# Patient Record
Sex: Male | Born: 1950 | Race: White | Hispanic: No | Marital: Married | Smoking: Never smoker
Health system: Southern US, Community
[De-identification: ages and names within clinical notes are randomized; demographics above are authoritative.]

## PROBLEM LIST (undated history)

## (undated) DIAGNOSIS — J310 Chronic rhinitis: Secondary | ICD-10-CM

## (undated) DIAGNOSIS — C61 Malignant neoplasm of prostate: Secondary | ICD-10-CM

## (undated) DIAGNOSIS — E785 Hyperlipidemia, unspecified: Secondary | ICD-10-CM

## (undated) DIAGNOSIS — Z86010 Personal history of colonic polyps: Secondary | ICD-10-CM

## (undated) DIAGNOSIS — N189 Chronic kidney disease, unspecified: Secondary | ICD-10-CM

## (undated) DIAGNOSIS — Z860101 Personal history of adenomatous and serrated colon polyps: Secondary | ICD-10-CM

## (undated) DIAGNOSIS — K5730 Diverticulosis of large intestine without perforation or abscess without bleeding: Secondary | ICD-10-CM

## (undated) DIAGNOSIS — I82409 Acute embolism and thrombosis of unspecified deep veins of unspecified lower extremity: Secondary | ICD-10-CM

## (undated) DIAGNOSIS — K432 Incisional hernia without obstruction or gangrene: Secondary | ICD-10-CM

## (undated) DIAGNOSIS — I10 Essential (primary) hypertension: Secondary | ICD-10-CM

## (undated) DIAGNOSIS — K5790 Diverticulosis of intestine, part unspecified, without perforation or abscess without bleeding: Secondary | ICD-10-CM

## (undated) DIAGNOSIS — E669 Obesity, unspecified: Secondary | ICD-10-CM

## (undated) DIAGNOSIS — T7840XA Allergy, unspecified, initial encounter: Secondary | ICD-10-CM

## (undated) DIAGNOSIS — C159 Malignant neoplasm of esophagus, unspecified: Secondary | ICD-10-CM

## (undated) HISTORY — DX: Malignant neoplasm of prostate: C61

## (undated) HISTORY — DX: Personal history of colonic polyps: Z86.010

## (undated) HISTORY — PX: EYE SURGERY: SHX253

## (undated) HISTORY — DX: Incisional hernia without obstruction or gangrene: K43.2

## (undated) HISTORY — DX: Hyperlipidemia, unspecified: E78.5

## (undated) HISTORY — DX: Acute embolism and thrombosis of unspecified deep veins of unspecified lower extremity: I82.409

## (undated) HISTORY — DX: Diverticulosis of large intestine without perforation or abscess without bleeding: K57.30

## (undated) HISTORY — DX: Allergy, unspecified, initial encounter: T78.40XA

## (undated) HISTORY — DX: Chronic kidney disease, unspecified: N18.9

## (undated) HISTORY — DX: Chronic rhinitis: J31.0

## (undated) HISTORY — DX: Essential (primary) hypertension: I10

## (undated) HISTORY — DX: Obesity, unspecified: E66.9

## (undated) HISTORY — DX: Diverticulosis of intestine, part unspecified, without perforation or abscess without bleeding: K57.90

## (undated) HISTORY — PX: OTHER SURGICAL HISTORY: SHX169

## (undated) HISTORY — DX: Personal history of adenomatous and serrated colon polyps: Z86.0101

---

## 1999-07-22 ENCOUNTER — Encounter (INDEPENDENT_AMBULATORY_CARE_PROVIDER_SITE_OTHER): Payer: Self-pay

## 1999-07-22 ENCOUNTER — Other Ambulatory Visit: Admission: RE | Admit: 1999-07-22 | Discharge: 1999-07-22 | Payer: Self-pay | Admitting: Gastroenterology

## 1999-07-22 ENCOUNTER — Encounter: Payer: Self-pay | Admitting: Gastroenterology

## 2004-05-03 ENCOUNTER — Ambulatory Visit: Payer: Self-pay | Admitting: Internal Medicine

## 2004-05-09 ENCOUNTER — Ambulatory Visit: Payer: Self-pay | Admitting: Internal Medicine

## 2004-06-13 ENCOUNTER — Encounter: Admission: RE | Admit: 2004-06-13 | Discharge: 2004-06-13 | Payer: Self-pay | Admitting: Internal Medicine

## 2004-07-05 ENCOUNTER — Ambulatory Visit: Payer: Self-pay | Admitting: Internal Medicine

## 2004-07-12 ENCOUNTER — Ambulatory Visit: Payer: Self-pay | Admitting: Internal Medicine

## 2004-12-13 ENCOUNTER — Ambulatory Visit: Payer: Self-pay | Admitting: Internal Medicine

## 2005-01-03 ENCOUNTER — Ambulatory Visit: Payer: Self-pay | Admitting: Internal Medicine

## 2005-02-07 ENCOUNTER — Ambulatory Visit: Payer: Self-pay | Admitting: Internal Medicine

## 2005-03-14 ENCOUNTER — Ambulatory Visit: Payer: Self-pay | Admitting: Internal Medicine

## 2005-04-04 ENCOUNTER — Ambulatory Visit: Payer: Self-pay | Admitting: Internal Medicine

## 2005-08-08 ENCOUNTER — Ambulatory Visit: Payer: Self-pay | Admitting: Internal Medicine

## 2005-08-14 ENCOUNTER — Ambulatory Visit: Payer: Self-pay | Admitting: Internal Medicine

## 2005-08-30 ENCOUNTER — Emergency Department (HOSPITAL_COMMUNITY): Admission: EM | Admit: 2005-08-30 | Discharge: 2005-08-30 | Payer: Self-pay | Admitting: Emergency Medicine

## 2005-09-18 ENCOUNTER — Ambulatory Visit: Payer: Self-pay | Admitting: Gastroenterology

## 2005-10-02 ENCOUNTER — Ambulatory Visit: Payer: Self-pay | Admitting: Gastroenterology

## 2005-10-02 ENCOUNTER — Encounter (INDEPENDENT_AMBULATORY_CARE_PROVIDER_SITE_OTHER): Payer: Self-pay | Admitting: *Deleted

## 2005-10-02 ENCOUNTER — Encounter (INDEPENDENT_AMBULATORY_CARE_PROVIDER_SITE_OTHER): Payer: Self-pay | Admitting: Specialist

## 2006-01-19 ENCOUNTER — Ambulatory Visit: Payer: Self-pay | Admitting: Internal Medicine

## 2006-03-02 ENCOUNTER — Ambulatory Visit: Payer: Self-pay | Admitting: Internal Medicine

## 2006-03-02 LAB — CONVERTED CEMR LAB
ALT: 23 units/L (ref 0–40)
Albumin: 3.6 g/dL (ref 3.5–5.2)
Alkaline Phosphatase: 40 units/L (ref 39–117)
Cholesterol: 175 mg/dL (ref 0–200)
LDL Cholesterol: 120 mg/dL — ABNORMAL HIGH (ref 0–99)
Total Bilirubin: 0.7 mg/dL (ref 0.3–1.2)
Total Protein: 6.3 g/dL (ref 6.0–8.3)

## 2006-03-09 ENCOUNTER — Ambulatory Visit: Payer: Self-pay | Admitting: Internal Medicine

## 2006-06-08 ENCOUNTER — Ambulatory Visit: Payer: Self-pay | Admitting: Internal Medicine

## 2006-06-08 LAB — CONVERTED CEMR LAB
Albumin: 3.6 g/dL (ref 3.5–5.2)
Alkaline Phosphatase: 40 units/L (ref 39–117)
Total Bilirubin: 1.1 mg/dL (ref 0.3–1.2)
Triglycerides: 206 mg/dL (ref 0–149)
VLDL: 41 mg/dL — ABNORMAL HIGH (ref 0–40)

## 2006-06-15 ENCOUNTER — Ambulatory Visit: Payer: Self-pay | Admitting: Internal Medicine

## 2006-08-19 ENCOUNTER — Ambulatory Visit: Payer: Self-pay | Admitting: Internal Medicine

## 2006-08-19 LAB — CONVERTED CEMR LAB
AST: 25 units/L (ref 0–37)
Albumin: 3.7 g/dL (ref 3.5–5.2)
Alkaline Phosphatase: 36 units/L — ABNORMAL LOW (ref 39–117)
BUN: 12 mg/dL (ref 6–23)
Basophils Absolute: 0 10*3/uL (ref 0.0–0.1)
Calcium: 9.1 mg/dL (ref 8.4–10.5)
Chloride: 109 meq/L (ref 96–112)
Cholesterol: 183 mg/dL (ref 0–200)
Creatinine, Ser: 0.9 mg/dL (ref 0.4–1.5)
Eosinophils Absolute: 0.2 10*3/uL (ref 0.0–0.6)
GFR calc non Af Amer: 92 mL/min
HCT: 45.1 % (ref 39.0–52.0)
LDL Cholesterol: 108 mg/dL — ABNORMAL HIGH (ref 0–99)
MCHC: 34.8 g/dL (ref 30.0–36.0)
MCV: 85.9 fL (ref 78.0–100.0)
Monocytes Relative: 9.4 % (ref 3.0–11.0)
PSA: 2.2 ng/mL (ref 0.10–4.00)
Platelets: 237 10*3/uL (ref 150–400)
RBC: 5.26 M/uL (ref 4.22–5.81)
RDW: 13 % (ref 11.5–14.6)
Total Bilirubin: 1 mg/dL (ref 0.3–1.2)
Total CHOL/HDL Ratio: 5.1
Triglycerides: 195 mg/dL — ABNORMAL HIGH (ref 0–149)

## 2006-08-24 ENCOUNTER — Ambulatory Visit: Payer: Self-pay | Admitting: Internal Medicine

## 2006-12-30 DIAGNOSIS — K5730 Diverticulosis of large intestine without perforation or abscess without bleeding: Secondary | ICD-10-CM

## 2006-12-30 DIAGNOSIS — Z860100 Personal history of colon polyps, unspecified: Secondary | ICD-10-CM | POA: Insufficient documentation

## 2006-12-30 DIAGNOSIS — E785 Hyperlipidemia, unspecified: Secondary | ICD-10-CM | POA: Insufficient documentation

## 2006-12-30 HISTORY — DX: Diverticulosis of large intestine without perforation or abscess without bleeding: K57.30

## 2007-04-29 HISTORY — PX: FRACTURE SURGERY: SHX138

## 2007-08-02 ENCOUNTER — Ambulatory Visit: Payer: Self-pay | Admitting: Internal Medicine

## 2007-08-02 LAB — CONVERTED CEMR LAB
HDL goal, serum: 40 mg/dL
LDL Goal: 130 mg/dL

## 2007-10-21 ENCOUNTER — Ambulatory Visit: Payer: Self-pay | Admitting: Internal Medicine

## 2007-10-21 LAB — CONVERTED CEMR LAB
ALT: 29 units/L (ref 0–53)
AST: 25 units/L (ref 0–37)
Bilirubin, Direct: 0.2 mg/dL (ref 0.0–0.3)
Cholesterol: 170 mg/dL (ref 0–200)
HDL: 30.5 mg/dL — ABNORMAL LOW (ref >39.0)
Total Bilirubin: 1 mg/dL (ref 0.3–1.2)
Total Protein: 6.5 g/dL (ref 6.0–8.3)
VLDL: 27 mg/dL (ref 0–40)

## 2007-11-01 ENCOUNTER — Ambulatory Visit: Payer: Self-pay | Admitting: Internal Medicine

## 2007-11-01 DIAGNOSIS — I8310 Varicose veins of unspecified lower extremity with inflammation: Secondary | ICD-10-CM | POA: Insufficient documentation

## 2008-01-21 ENCOUNTER — Telehealth: Payer: Self-pay | Admitting: Internal Medicine

## 2008-01-24 ENCOUNTER — Ambulatory Visit: Payer: Self-pay | Admitting: Internal Medicine

## 2008-02-18 ENCOUNTER — Ambulatory Visit: Payer: Self-pay | Admitting: Internal Medicine

## 2008-02-18 LAB — CONVERTED CEMR LAB
ALT: 24 units/L (ref 0–53)
AST: 27 units/L (ref 0–37)
Albumin: 3.7 g/dL (ref 3.5–5.2)
Alkaline Phosphatase: 44 units/L (ref 39–117)
Bilirubin, Direct: 0.2 mg/dL (ref 0.0–0.3)
CRP, High Sensitivity: 1 (ref 0.00–5.00)
Cholesterol: 184 mg/dL (ref 0–200)
HDL: 29.9 mg/dL — ABNORMAL LOW (ref >39.0)
LDL Cholesterol: 127 mg/dL — ABNORMAL HIGH (ref 0–99)
Total Bilirubin: 0.9 mg/dL (ref 0.3–1.2)
Total CHOL/HDL Ratio: 6.2
Total Protein: 6.5 g/dL (ref 6.0–8.3)
Triglycerides: 137 mg/dL (ref 0–149)
VLDL: 27 mg/dL (ref 0–40)

## 2008-02-29 ENCOUNTER — Ambulatory Visit: Payer: Self-pay | Admitting: Internal Medicine

## 2008-04-18 ENCOUNTER — Inpatient Hospital Stay (HOSPITAL_COMMUNITY): Admission: EM | Admit: 2008-04-18 | Discharge: 2008-04-29 | Payer: Self-pay | Admitting: Emergency Medicine

## 2008-04-19 ENCOUNTER — Ambulatory Visit: Payer: Self-pay | Admitting: Gastroenterology

## 2008-04-24 ENCOUNTER — Encounter: Payer: Self-pay | Admitting: Orthopedic Surgery

## 2008-04-24 HISTORY — PX: OTHER SURGICAL HISTORY: SHX169

## 2008-07-04 ENCOUNTER — Encounter: Admission: RE | Admit: 2008-07-04 | Discharge: 2008-07-04 | Payer: Self-pay | Admitting: Orthopedic Surgery

## 2008-07-04 ENCOUNTER — Encounter: Payer: Self-pay | Admitting: Internal Medicine

## 2008-08-08 ENCOUNTER — Ambulatory Visit: Payer: Self-pay | Admitting: Internal Medicine

## 2008-08-08 LAB — CONVERTED CEMR LAB
Albumin: 4 g/dL (ref 3.5–5.2)
CRP, High Sensitivity: 4 (ref 0.00–5.00)
Cholesterol: 188 mg/dL (ref 0–200)
LDL Cholesterol: 129 mg/dL — ABNORMAL HIGH (ref 0–99)
Total CHOL/HDL Ratio: 5
Total Protein: 7.4 g/dL (ref 6.0–8.3)
Triglycerides: 126 mg/dL (ref 0.0–149.0)
VLDL: 25.2 mg/dL (ref 0.0–40.0)

## 2008-08-09 ENCOUNTER — Encounter: Payer: Self-pay | Admitting: Internal Medicine

## 2008-08-11 ENCOUNTER — Encounter (INDEPENDENT_AMBULATORY_CARE_PROVIDER_SITE_OTHER): Payer: Self-pay | Admitting: *Deleted

## 2008-08-15 ENCOUNTER — Ambulatory Visit: Payer: Self-pay | Admitting: Internal Medicine

## 2008-08-15 DIAGNOSIS — M85851 Other specified disorders of bone density and structure, right thigh: Secondary | ICD-10-CM

## 2008-09-15 ENCOUNTER — Telehealth: Payer: Self-pay | Admitting: Gastroenterology

## 2008-09-26 HISTORY — PX: POLYPECTOMY: SHX149

## 2008-09-26 HISTORY — PX: COLONOSCOPY: SHX174

## 2008-10-03 ENCOUNTER — Ambulatory Visit: Payer: Self-pay | Admitting: Gastroenterology

## 2008-10-03 ENCOUNTER — Telehealth: Payer: Self-pay | Admitting: Internal Medicine

## 2008-10-03 ENCOUNTER — Telehealth (INDEPENDENT_AMBULATORY_CARE_PROVIDER_SITE_OTHER): Payer: Self-pay | Admitting: *Deleted

## 2008-10-04 ENCOUNTER — Telehealth: Payer: Self-pay | Admitting: Gastroenterology

## 2008-10-11 ENCOUNTER — Ambulatory Visit: Payer: Self-pay | Admitting: Gastroenterology

## 2008-10-11 DIAGNOSIS — K921 Melena: Secondary | ICD-10-CM

## 2008-10-13 ENCOUNTER — Ambulatory Visit: Payer: Self-pay | Admitting: Gastroenterology

## 2008-10-13 ENCOUNTER — Encounter: Payer: Self-pay | Admitting: Gastroenterology

## 2008-10-21 ENCOUNTER — Encounter: Payer: Self-pay | Admitting: Gastroenterology

## 2008-11-13 ENCOUNTER — Ambulatory Visit: Payer: Self-pay | Admitting: Internal Medicine

## 2009-02-12 ENCOUNTER — Ambulatory Visit: Payer: Self-pay | Admitting: Internal Medicine

## 2009-02-12 LAB — CONVERTED CEMR LAB
ALT: 18 units/L (ref 0–53)
BUN: 21 mg/dL (ref 6–23)
Basophils Relative: 1 % (ref 0.0–3.0)
Bilirubin Urine: NEGATIVE
CO2: 31 meq/L (ref 19–32)
Chloride: 104 meq/L (ref 96–112)
Cholesterol: 198 mg/dL (ref 0–200)
Eosinophils Relative: 6.8 % — ABNORMAL HIGH (ref 0.0–5.0)
Glucose, Urine, Semiquant: NEGATIVE
HCT: 43.4 % (ref 39.0–52.0)
Ketones, urine, test strip: NEGATIVE
LDL Cholesterol: 139 mg/dL — ABNORMAL HIGH (ref 0–99)
Lymphs Abs: 1.4 10*3/uL (ref 0.7–4.0)
MCHC: 33.6 g/dL (ref 30.0–36.0)
MCV: 88.9 fL (ref 78.0–100.0)
Monocytes Absolute: 0.5 10*3/uL (ref 0.1–1.0)
PSA: 3.03 ng/mL (ref 0.10–4.00)
Platelets: 190 10*3/uL (ref 150.0–400.0)
Potassium: 3.8 meq/L (ref 3.5–5.1)
Specific Gravity, Urine: 1.02
TSH: 1.96 microintl units/mL (ref 0.35–5.50)
Total Bilirubin: 1.1 mg/dL (ref 0.3–1.2)
Total Protein: 7.1 g/dL (ref 6.0–8.3)
WBC: 5.3 10*3/uL (ref 4.5–10.5)
pH: 5

## 2009-02-19 ENCOUNTER — Ambulatory Visit: Payer: Self-pay | Admitting: Internal Medicine

## 2009-04-24 ENCOUNTER — Ambulatory Visit: Payer: Self-pay | Admitting: Internal Medicine

## 2009-04-24 LAB — CONVERTED CEMR LAB
AST: 21 units/L (ref 0–37)
Alkaline Phosphatase: 37 units/L — ABNORMAL LOW (ref 39–117)
Total Bilirubin: 0.8 mg/dL (ref 0.3–1.2)
Total CHOL/HDL Ratio: 5

## 2009-05-01 ENCOUNTER — Ambulatory Visit: Payer: Self-pay | Admitting: Internal Medicine

## 2009-05-01 DIAGNOSIS — R030 Elevated blood-pressure reading, without diagnosis of hypertension: Secondary | ICD-10-CM

## 2009-05-01 DIAGNOSIS — M25579 Pain in unspecified ankle and joints of unspecified foot: Secondary | ICD-10-CM | POA: Insufficient documentation

## 2009-08-16 ENCOUNTER — Ambulatory Visit: Payer: Self-pay | Admitting: Internal Medicine

## 2009-08-16 LAB — CONVERTED CEMR LAB
HDL: 41.9 mg/dL (ref >39.00)
LDL Cholesterol: 127 mg/dL — ABNORMAL HIGH (ref 0–99)
Total Bilirubin: 1.1 mg/dL (ref 0.3–1.2)
Total CHOL/HDL Ratio: 5
VLDL: 24.4 mg/dL (ref 0.0–40.0)

## 2009-08-23 ENCOUNTER — Ambulatory Visit: Payer: Self-pay | Admitting: Internal Medicine

## 2009-08-23 LAB — CONVERTED CEMR LAB
Cholesterol, target level: 200 mg/dL
LDL Goal: 160 mg/dL

## 2009-10-25 ENCOUNTER — Ambulatory Visit: Payer: Self-pay | Admitting: Internal Medicine

## 2010-04-26 ENCOUNTER — Ambulatory Visit: Payer: Self-pay | Admitting: Internal Medicine

## 2010-05-28 NOTE — Assessment & Plan Note (Signed)
Summary: 2 MONTH ROV/NJR   Vital Signs:  Patient profile:   60 year old male Height:      74 inches Weight:      244 pounds BMI:     31.44 Temp:     98.2 degrees F oral Pulse rate:   76 / minute Resp:     14 per minute BP sitting:   138 / 80  (left arm)  Vitals Entered By: Willy Eddy, LPN (October 25, 2009 9:53 AM)  Nutrition Counseling: Patient's BMI is greater than 25 and therefore counseled on weight management options. CC: roa, Lipid Management   Primary Care Provider:  Darryll Capers, MD  CC:  roa and Lipid Management.  History of Present Illness: follow up of lipids  Follow-Up Visit      This is a 60 year old man who presents for Follow-up visit.  The patient denies chest pain, palpitations, dizziness, syncope, low blood sugar symptoms, high blood sugar symptoms, edema, SOB, DOE, PND, and orthopnea.    Lipid Management History:      Positive NCEP/ATP III risk factors include male age 61 years old or older. or older.  Negative NCEP/ATP III risk factors include non-tobacco-user status, non-hypertensive, no ASHD (atherosclerotic heart disease), no prior stroke/TIA, no peripheral vascular disease, and no history of aortic aneurysm.     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) 7)  Family History Diabetes 1st Degree Relative  (ICD-V18.0) 8)  Hyperlipidemia  (ICD-272.4) 9)  Diverticulosis, Colon  (ICD-562.10) 10)  Colonic Polyps, Hx of  (ICD-V12.72)  Current Problems (verified): 1)  Ankle Pain, Chronic  (ICD-719.47) 2)  Elevated Bp Reading Without Dx Hypertension  (ICD-796.2) 3)  Blood in Stool  (ICD-578.1) 4)  Preventive Health Care   (ICD-V70.0) 5)  Osteopenia  (ICD-733.90) 6)  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 Once Daily 2)  Fish Oil Double Strength 1200 Mg  Caps (Omega-3 Fatty Acids) .... 3 Once Daily 3)  Multivitamins   Caps (Multiple Vitamin) .... Once Daily 4)  Caltrate 600+d 600-400 Mg-Unit Tabs (Calcium Carbonate-Vitamin D) .Marland Kitchen.. 1 Two Times A Day 5)  Niacin Flush Free 500 Mg Caps (Inositol Niacinate) .... 2 Once Daily  Current Medications (verified): 1)  Bl Aspirin 325 Mg  Tabs (Aspirin) .... Once Daily 2)  Fish Oil Double Strength 1200 Mg  Caps (Omega-3 Fatty Acids) .... 3 Once Daily 3)  Multivitamins   Caps (Multiple Vitamin) .... Once Daily 4)  Caltrate 600+d 600-400 Mg-Unit Tabs (Calcium Carbonate-Vitamin D) .Marland Kitchen.. 1 Two Times A Day 5)  Niacin Flush Free 500 Mg Caps (Inositol Niacinate) .... 2 Once Daily  Allergies (verified): 1)  * Fentanyl  Past History:  Family History: Last updated: 10/11/2008 Family History of Arthritis Family History Diabetes 1st degree relative Family History of Stroke M 1st degree relative <50 Family History of Cardiovascular disorder father had cerebral hemorrhage Family History of Colon Polyps: Father  Family History of Colon Cancer:Great Aunt  Social History: Last updated: 10/11/2008 Occupation:Retired  Married Never Smoked Alcohol use-yes: 2 per week  Drug use-no Daily Caffeine Use:1 cup daily  Risk Factors: Smoking Status: never (10/25/2009)  Past medical, surgical, family and social histories (including risk factors) reviewed, and no changes noted (except as noted below).  Past Medical History: Reviewed history from 10/11/2008 and no changes  required. 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 Eye surgery Fx L tibia, fibula with external fixator Removal fixator Varicocele repair  Bilateral vein ligation  Family History: Reviewed history from 10/11/2008 and no changes required. Family History of Arthritis Family History Diabetes 1st degree relative Family History of Stroke M 1st degree relative <50 Family History of Cardiovascular disorder father had cerebral hemorrhage Family History of Colon Polyps: Father Family History of Colon Cancer:Great Aunt  Social History: Reviewed history from 10/11/2008 and no changes required. Occupation:Retired  Married Never Smoked Alcohol use-yes: 2 per week  Drug use-no Daily Caffeine Use:1 cup daily  Review of Systems  The patient denies anorexia, fever, weight loss, weight gain, vision loss, decreased hearing, hoarseness, chest pain, syncope, dyspnea on exertion, peripheral edema, prolonged cough, headaches, hemoptysis, abdominal pain, melena, hematochezia, severe indigestion/heartburn, hematuria, incontinence, genital sores, muscle weakness, suspicious skin lesions, transient blindness, difficulty walking, depression, unusual weight change, abnormal bleeding, enlarged lymph nodes, angioedema, and breast masses.    Physical Exam  General:  Well developed, well nourished, no acute distress. Head:  Normocephalic and atraumatic. Eyes:  PERRLA, no icterus. Nose:  no external deformity and no nasal discharge.   Mouth:  no gingival abnormalities and pharynx pink and moist.   Neck:  supple and full ROM.   Lungs:  normal respiratory effort and no wheezes.     Impression & Recommendations:  Problem # 1:  HYPERLIPIDEMIA (ICD-272.4)  Labs Reviewed: SGOT: 18 (08/16/2009)   SGPT: 20 (08/16/2009)  Lipid Goals: Chol Goal: 200 (08/23/2009)    HDL Goal: 40 (08/23/2009)   LDL Goal: 160 (08/23/2009)   TG Goal: 150 (08/23/2009)  10 Yr Risk Heart Disease: 14 % Prior 10 Yr Risk Heart Disease: 18 % (08/23/2009)   HDL:41.90 (08/16/2009), 38.60 (04/24/2009)  LDL:127 (08/16/2009), 139 (02/12/2009)  Chol:193 (08/16/2009), 211 (04/24/2009)  Trig:122.0 (08/16/2009), 136.0 (04/24/2009)  Problem # 2:  ELEVATED BP READING WITHOUT DX HYPERTENSION (ICD-796.2)  BP today: 138/80 Prior BP: 140/82 (08/23/2009)  10 Yr Risk Heart Disease: 14 % Prior 10 Yr Risk Heart Disease: 18 % (08/23/2009)  Labs Reviewed: Creat: 0.9 (02/12/2009) Chol: 193 (08/16/2009)   HDL: 41.90 (08/16/2009)   LDL: 127 (08/16/2009)   TG: 122.0 (08/16/2009)  Instructed in low sodium diet (DASH Handout) and behavior modification.    Problem # 3:  VARICOSE VEINS LOWER EXTREMITIES W/INFLAMMATION (ICD-454.1)  Complete Medication List: 1)  Bl Aspirin 325 Mg Tabs (Aspirin) .... Once daily 2)  Fish Oil Double Strength 1200 Mg Caps (Omega-3 fatty acids) .... 3 once daily 3)  Multivitamins Caps (Multiple vitamin) .... Once daily 4)  Caltrate 600+d 600-400 Mg-unit Tabs (Calcium carbonate-vitamin d) .Marland Kitchen.. 1 two times a day 5)  Niacin Flush Free 500 Mg Caps (Inositol niacinate) .... 2 once daily  Lipid Assessment/Plan:      Based on NCEP/ATP III, the patient's risk factor category is "0-1 risk factors".  The patient's lipid goals are as follows: Total cholesterol goal is 200; LDL cholesterol goal is 160; HDL  cholesterol goal is 40; Triglyceride goal is 150.  His LDL cholesterol goal has been met.    Patient Instructions: 1)  CPX in 4 months  Appended Document: Orders Update    Clinical Lists Changes  Orders: Added new Service order of Zoster (Shingles) Vaccine Live [phone]) - Signed Added new Service order of Admin 1st Vaccine ([id number]) - Signed Observations: Added new observation of ZOSTAVAX LOT: [id number] (10/25/2009 10:47) Added new observation of ZOSTAVAX EXP: 11/21/2010  (10/25/2009 10:47) Added new observation of ZOSTAVAX BY: Willy Eddy, LPN (14/78/2956 10:47) Added new observation of ZOSTAVAX RTE: McNeal (10/25/2009 10:47) Added new observation of ZOSTAVAXDOSE: 0.5 ml (10/25/2009 10:47) Added new observation of ZOSTAVAX MFR: Merck (10/25/2009 10:47) Added new observation of ZOSTAVAXSITE: left deltoid (10/25/2009 10:47) Added new observation of ZOSTAVAX: Zostavax (10/25/2009 10:47)       Immunizations Administered:  Zostavax # 1:    Vaccine Type: Zostavax    Site: left deltoid    Mfr: Merck    Dose: 0.5 ml    Route: Nuevo    Given by: Willy Eddy, LPN    Exp. Date: 11/21/2010    Lot #: [id number]

## 2010-05-28 NOTE — Miscellaneous (Signed)
Summary: Waiver of Liability for Zostavax  Waiver of Liability for Zostavax   Imported By: Maryln Gottron 10/30/2009 09:35:47  _____________________________________________________________________  External Attachment:    Type:   Image     Comment:   External Document

## 2010-05-28 NOTE — Assessment & Plan Note (Signed)
Summary: 2 month roa//lh   Vital Signs:  Patient profile:   60 year old male Height:      74 inches Weight:      247 pounds BMI:     31.83 Temp:     98.2 degrees F oral Pulse rate:   76 / minute Resp:     14 per minute BP sitting:   150 / 84  (left arm)  Vitals Entered By: Willy Eddy, LPN (May 01, 2009 10:06 AM) CC: roa- labs, Hypertension Management, Headache   Primary Care Provider:  Darryll Capers, MD  CC:  roa- labs, Hypertension Management, and Headache.  History of Present Illness: the pt has a family  hx of cerebral anurysm the pt has hyperlipidemia and HTN as additional risks the lotrimen ultra has helped, the dermatologist rxed ketoconasole and betamethasone which has helped   Hypertension History:      He denies headache, chest pain, palpitations, dyspnea with exertion, orthopnea, PND, peripheral edema, visual symptoms, neurologic problems, syncope, and side effects from treatment.  with the weight issues.        Positive major cardiovascular risk factors include male age 39 years old or older and hyperlipidemia.  Negative major cardiovascular risk factors include no history of hypertension and non-tobacco-user status.        Further assessment for target organ damage reveals no history of ASHD, stroke/TIA, or peripheral vascular disease.    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 Once Daily 2)  Fish Oil Double Strength 1200  Mg  Caps (Omega-3 Fatty Acids) .... 3 Once Daily 3)  Multivitamins   Caps (Multiple Vitamin) .... Once Daily 4)  Caltrate 600+d 600-400 Mg-Unit Tabs (Calcium Carbonate-Vitamin D) .Marland Kitchen.. 1 Two Times A Day 5)  Livalo 2 Mg Tabs (Pitavastatin Calcium) .... One By Mouth Weeklu  Current Medications (verified): 1)  Bl Aspirin 325 Mg  Tabs (Aspirin) .... Once Daily 2)  Fish Oil Double Strength 1200 Mg  Caps (Omega-3 Fatty Acids) .... 3 Once Daily 3)  Multivitamins   Caps (Multiple Vitamin) .... Once Daily 4)  Caltrate 600+d 600-400 Mg-Unit Tabs (Calcium Carbonate-Vitamin D) .Marland Kitchen.. 1 Two Times A Day 5)  Livalo 2 Mg Tabs (Pitavastatin Calcium) .... One By Mouth Weeklu  Allergies (verified): 1)  * Fentanyl  Past History:  Family History: Last updated: 10/11/2008 Family History of Arthritis Family History Diabetes 1st degree relative Family History of Stroke M 1st degree relative <50 Family History of Cardiovascular disorder father had cerebral hemorrhage Family History of Colon Polyps: Father Family History of Colon Cancer:Great Aunt  Social History: Last updated: 10/11/2008 Occupation:Retired  Married Never Smoked Alcohol use-yes: 2 per week  Drug use-no Daily Caffeine Use:1 cup daily  Risk Factors: Smoking Status: never (05/01/2009)  Past medical, surgical, family and social histories (including risk factors) reviewed, and no changes noted (except as noted below).  Past Medical History: Reviewed history from 10/11/2008 and no changes required. 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 Eye surgery Fx L tibia, fibula with external fixator Removal fixator Varicocele repair  Bilateral vein ligation  Family History: Reviewed history from 10/11/2008 and no changes required. Family History of Arthritis Family History Diabetes 1st degree  relative Family History of Stroke M 1st degree relative <50 Family History of Cardiovascular disorder father had cerebral hemorrhage Family History of Colon Polyps: Father Family History of Colon Cancer:Great Aunt  Social History: Reviewed history from 10/11/2008 and no changes required. Occupation:Retired  Married Never Smoked Alcohol use-yes: 2 per week  Drug use-no Daily Caffeine Use:1 cup daily  Review of Systems       The patient complains of weight gain.  The patient denies anorexia, fever, weight loss, vision loss, decreased hearing, hoarseness, chest pain, syncope, dyspnea on exertion, peripheral edema, prolonged cough, headaches, hemoptysis, abdominal pain, melena, hematochezia, severe indigestion/heartburn, hematuria, incontinence, genital sores, muscle weakness, suspicious skin lesions, transient blindness, difficulty walking, depression, unusual weight change, abnormal bleeding, enlarged lymph nodes, angioedema, breast masses, and testicular masses.    Physical Exam  General:  Well developed, well nourished, no acute distress. Head:  Normocephalic and atraumatic. Eyes:  PERRLA, no icterus. Ears:  R ear normal and no external deformities.   Nose:  no external deformity and no nasal discharge.   Mouth:  no gingival abnormalities and pharynx pink and moist.   Neck:  supple and full ROM.   Lungs:  normal respiratory effort and no wheezes.   Heart:  normal rate, regular rhythm, and no rub.   Abdomen:  no masses and no guarding.     Impression & Recommendations:  Problem # 1:  HYPERLIPIDEMIA (ICD-272.4) diffuse rash on a abdominal wall suggestive of a drug rash His updated medication list for this problem includes:    Livalo 2 Mg Tabs (Pitavastatin calcium) ..... One by mouth weeklu  Labs Reviewed: SGOT: 21 (04/24/2009)   SGPT: 22 (04/24/2009)  Lipid Goals: Chol Goal: 200 (08/02/2007)   HDL Goal: 40 (08/02/2007)   LDL Goal: 130 (08/02/2007)   TG Goal: 150  (08/02/2007)  Prior 10 Yr Risk Heart Disease: 18 % (08/15/2008)   HDL:38.60 (04/24/2009), 36.40 (02/12/2009)  LDL:139 (02/12/2009), 129 (08/08/2008)  Chol:211 (04/24/2009), 198 (02/12/2009)  Trig:136.0 (04/24/2009), 113.0 (02/12/2009)  Problem # 2:  VARICOSE VEINS LOWER EXTREMITIES W/INFLAMMATION (ICD-454.1) wearing hose  Problem # 3:  ABDOMINAL PAIN, PERIUMBILIC (ICD-789.05)  stable  Discussed symptom control with the patient.   Problem # 4:  ELEVATED BP READING WITHOUT DX HYPERTENSION (ICD-796.2)  BP today: 150/84 Prior BP: 136/80 (02/19/2009)  10 Yr Risk Heart Disease: 14 % Prior 10 Yr Risk Heart Disease: 18 % (08/15/2008)  Labs Reviewed: Creat: 0.9 (02/12/2009) Chol: 211 (04/24/2009)   HDL: 38.60 (04/24/2009)   LDL: 139 (02/12/2009)   TG: 136.0 (04/24/2009)  Instructed in low sodium diet (DASH Handout) and behavior modification.    Complete Medication List: 1)  Bl Aspirin 325 Mg Tabs (Aspirin) .... Once daily 2)  Fish Oil Double Strength 1200 Mg Caps (Omega-3 fatty acids) .... 3 once daily 3)  Multivitamins Caps (Multiple vitamin) .... Once daily 4)  Caltrate 600+d 600-400 Mg-unit Tabs (Calcium carbonate-vitamin d) .Marland Kitchen.. 1 two times a day 5)  Livalo 2 Mg Tabs (Pitavastatin calcium) .... One by mouth weeklu  Other Orders: Physical Therapy Referral (PT)  Hypertension Assessment/Plan:      The patient's hypertensive risk group is category B:  At least one risk factor (excluding diabetes) with no target organ damage.  His calculated 10 year risk of coronary heart disease is 14 %.  Today's blood pressure is 150/84.  His blood pressure goal is < 140/90.    Patient Instructions: 1)  blood pressure is elevated 150/90 with historical blood pressure n the 130 range so the weight gain plays the biggest role 2)  the lipoids are worse as well and we need to resure diet, exercize and weight loss for the best treatment 3)  resume the niacin 500 mg of no flush two a day 4)  Please  schedule a follow-up appointment in 3 months. 5)  Hepatic Panel prior to visit, ICD-9:995.20 6)  Lipid Panel prior to visit, ICD-9:272.4

## 2010-05-28 NOTE — Assessment & Plan Note (Signed)
Summary: 3 MONTH ROV/NJR/PT RESCD FROM BUMP//CCM/PT RSC/CJR   Vital Signs:  Patient profile:   60 year old male Height:      74 inches Weight:      252 pounds BMI:     32.47 Temp:     98.6 degrees F oral Pulse rate:   76 / minute Resp:     14 per minute BP sitting:   140 / 82  (left arm)  Vitals Entered By: Willy Eddy, LPN (August 23, 2009 10:27 AM) CC: roa labs- back on flush free niacin- livalo caused rash, Lipid Management   Primary Care Provider:  Darryll Capers, MD  CC:  roa labs- back on flush free niacin- livalo caused rash and Lipid Management.  History of Present Illness: WEIGTH IS INCREASED  Hyperlipidemia Follow-Up      This is a 60 year old man who presents for Hyperlipidemia follow-up.  the weight reducton goal were not met due to evening meals and exercize.  The patient denies muscle aches, GI upset, abdominal pain, flushing, itching, constipation, diarrhea, and fatigue.  The patient denies the following symptoms: chest pain/pressure, exercise intolerance, dypsnea, palpitations, syncope, and pedal edema.  Compliance with medications (by patient report) has been intermittent.  Dietary compliance has been poor.  The patient reports exercising occasionally.    Lipid Management History:      Positive NCEP/ATP III risk factors include male age 30 years old or older.  Negative NCEP/ATP III risk factors include non-tobacco-user status, non-hypertensive, no ASHD (atherosclerotic heart disease), no prior stroke/TIA, no peripheral vascular disease, and no history of aortic aneurysm.     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) 7)   Family History Diabetes 1st Degree Relative  (ICD-V18.0) 8)  Hyperlipidemia  (ICD-272.4) 9)  Diverticulosis, Colon  (ICD-562.10) 10)  Colonic Polyps, Hx of  (ICD-V12.72)  Current Problems (verified): 1)  Ankle Pain, Chronic  (ICD-719.47) 2)  Elevated Bp Reading Without Dx Hypertension  (ICD-796.2) 3)  Blood in Stool  (ICD-578.1) 4)  Preventive Health Care  (ICD-V70.0) 5)  Osteopenia  (ICD-733.90) 6)  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 Once Daily 2)  Fish Oil Double Strength 1200 Mg  Caps (Omega-3 Fatty Acids) .... 3 Once Daily 3)  Multivitamins   Caps (Multiple Vitamin) .... Once Daily 4)  Caltrate 600+d 600-400 Mg-Unit Tabs (Calcium Carbonate-Vitamin D) .Marland Kitchen.. 1 Two Times A Day 5)  Livalo 2 Mg Tabs (Pitavastatin Calcium) .... One By Mouth Weeklu  Current Medications (verified): 1)  Bl Aspirin 325 Mg  Tabs (Aspirin) .... Once Daily 2)  Fish Oil Double Strength 1200 Mg  Caps (Omega-3 Fatty Acids) .... 3 Once Daily 3)  Multivitamins   Caps (Multiple Vitamin) .... Once Daily 4)  Caltrate 600+d 600-400 Mg-Unit Tabs (Calcium Carbonate-Vitamin D) .Marland Kitchen.. 1 Two Times A Day 5)  Niacin Flush Free 500 Mg Caps (Inositol Niacinate) .... 2 Once Daily  Allergies (verified): 1)  * Fentanyl  Past History:  Family History: Last updated: 10/11/2008 Family History of Arthritis Family History Diabetes 1st degree relative Family History of Stroke M 1st degree relative <50 Family History of Cardiovascular disorder father had cerebral hemorrhage Family History of Colon Polyps: Father Family History of Colon Cancer:Great Aunt  Social History: Last updated: 10/11/2008 Occupation:Retired  Married Never Smoked Alcohol use-yes: 2  per week  Drug use-no Daily Caffeine Use:1 cup daily  Risk Factors: Smoking Status: never (08/23/2009)  Past medical, surgical, family and social histories (including risk factors) reviewed, and no changes noted (except as noted below).  Past Medical History: Reviewed history from 10/11/2008 and no changes required. 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 Eye surgery Fx L tibia, fibula with external fixator Removal fixator Varicocele repair  Bilateral vein ligation  Family History: Reviewed history from 10/11/2008 and no changes required. Family History of Arthritis Family History Diabetes 1st degree relative Family History of Stroke M 1st degree relative <50 Family History of Cardiovascular disorder father had cerebral hemorrhage Family History of Colon Polyps: Father Family History of Colon Cancer:Great Aunt  Social History: Reviewed history from 10/11/2008 and no changes required. Occupation:Retired  Married Never Smoked Alcohol use-yes: 2 per week  Drug use-no Daily Caffeine Use:1 cup daily  Review of Systems  The patient denies anorexia, fever, weight loss, weight gain, vision loss, decreased hearing, hoarseness, chest pain, syncope, dyspnea on exertion, peripheral edema, prolonged cough, headaches, hemoptysis, abdominal pain, melena, hematochezia, severe indigestion/heartburn, hematuria, incontinence, genital sores, muscle weakness, suspicious skin lesions, transient blindness, difficulty walking, depression, unusual weight change, abnormal bleeding, enlarged lymph nodes, angioedema, and breast masses.    Physical Exam  General:  Well developed, well nourished, no acute distress. Head:  Normocephalic and atraumatic. Eyes:  PERRLA, no icterus. Ears:  R ear normal and no external deformities.   Nose:  no external  deformity and no nasal discharge.   Mouth:  no gingival abnormalities and pharynx pink and moist.   Neck:  supple and full ROM.   Lungs:  normal respiratory effort and no wheezes.   Heart:  normal rate, regular rhythm, and no rub.   Abdomen:  no masses and no guarding.   Msk:  normal ROM, no joint tenderness, and no joint swelling.     Impression & Recommendations:  Problem # 1:  ELEVATED BP READING WITHOUT DX HYPERTENSION (ICD-796.2) Assessment Unchanged  BP today: 140/82 Prior BP: 150/84 (05/01/2009)  10 Yr Risk Heart Disease: 18 % Prior 10 Yr Risk Heart Disease: 14 % (05/01/2009)  Labs Reviewed: Creat: 0.9 (02/12/2009) Chol: 193 (08/16/2009)   HDL: 41.90 (08/16/2009)   LDL: 127 (08/16/2009)   TG: 122.0 (08/16/2009)  Instructed in low sodium diet (DASH Handout) and behavior modification.    Problem # 2:  HYPERLIPIDEMIA (ICD-272.4) Assessment: Improved  Labs Reviewed: SGOT: 18 (08/16/2009)   SGPT: 20 (08/16/2009)  Lipid Goals: Chol Goal: 200 (08/23/2009)   HDL Goal: 40 (08/23/2009)   LDL Goal: 160 (08/23/2009)   TG Goal: 150 (08/23/2009)  10 Yr Risk Heart Disease: 18 % Prior 10 Yr Risk Heart Disease: 14 % (05/01/2009)   HDL:41.90 (08/16/2009), 38.60 (04/24/2009)  LDL:127 (08/16/2009), 139 (02/12/2009)  Chol:193 (08/16/2009), 211 (04/24/2009)  Trig:122.0 (08/16/2009), 136.0 (04/24/2009)  Problem # 3:  DIVERTICULOSIS, COLON (ICD-562.10)  stable with diet  Colonoscopy:  Labs Reviewed: Hgb: 14.6 (02/12/2009)   Hct: 43.4 (  02/12/2009)   WBC: 5.3 (02/12/2009)  Complete Medication List: 1)  Bl Aspirin 325 Mg Tabs (Aspirin) .... Once daily 2)  Fish Oil Double Strength 1200 Mg Caps (Omega-3 fatty acids) .... 3 once daily 3)  Multivitamins Caps (Multiple vitamin) .... Once daily 4)  Caltrate 600+d 600-400 Mg-unit Tabs (Calcium carbonate-vitamin d) .Marland Kitchen.. 1 two times a day 5)  Niacin Flush Free 500 Mg Caps (Inositol niacinate) .... 2 once daily  Lipid Assessment/Plan:       Based on NCEP/ATP III, the patient's risk factor category is "0-1 risk factors".  The patient's lipid goals are as follows: Total cholesterol goal is 200; LDL cholesterol goal is 160; HDL cholesterol goal is 40; Triglyceride goal is 150.  His LDL cholesterol goal has been met.    Patient Instructions: 1)  Please schedule a follow-up appointment in 2 months.

## 2010-07-11 ENCOUNTER — Other Ambulatory Visit: Payer: Self-pay

## 2010-07-22 ENCOUNTER — Encounter: Payer: Self-pay | Admitting: Internal Medicine

## 2010-09-10 NOTE — Consult Note (Signed)
NAMEEREK, KOWAL               ACCOUNT NO.:  [id number]   MEDICAL RECORD NO.:  [id number]          PATIENT TYPE:  INP   LOCATION:  1612                         FACILITY:  [facility]   PHYSICIAN:  Doralee Albino. Carola Frost, M.D. DATE OF BIRTH:  09/04/1950   DATE OF CONSULTATION:  04/22/2008  DATE OF DISCHARGE:                                 CONSULTATION   REASON FOR CONSULTATION:  Grade 3A open left tib-fib fracture status  post external fixation.   HISTORY OF PRESENT ILLNESS:  Mr. Haubner is a very pleasant, 60 year old,  Caucasian male, who states that on the afternoon of April 18, 2008,  he was at home working on his house.  He was up on a ladder  approximately 5 feet off the ground fixing some windows when he  subsequently lost his balance.  The patient fell on the ground below  landing on grass and bushes and sustained an injury to his left leg.  Patient had immediate onset of pain and inability to bear weight.  Patient was brought emergently to [facility] for evaluation.  While at [facility], it was determined through evaluation that Mr.  Ritchie did indeed sustain an open left distal tib-fib fracture, which  required immediate I and D and external fixation.  Patient was initially  seen by Dr. Beverely Low, who completed a workup and took Mr. Ducksworth to  the operating room for I and D and external fixation with a Synthes  external fixator system.  Given the extent of the injury and the  complexity, Dr. Ranell Patrick sought consultation from Dr. Carola Frost in the  Orthopedic Trauma Service, rather than transferring him out of town to a  tertiary facility.  After surgery, Mr. Trickett was placed on IV  gentamicin and IV Ancef for antimicrobial prophylaxis.  He was also  started on mechanical DVT prophylaxis with split pumps.  After 48 hours,  his Ancef was discontinued but continued to be on gentamicin.  Of note,  upon postoperative suctioning, there was some coffee ground looking  aspirate  that was evacuated, which prompted concerns for a possible  upper GI bleed. As such, a GI consult was obtained, but his H and H  remained stable with a normal BUN and no subsequent bleeding noted from  his upper GI tract nor did the patient have any BM, which would help  with establishing additional diagnosis of GI bleeding.  As such, the  patient was started on a PPI for treatment of possible erosive disease.  Through his hospital stay, Mr. Mika was receiving adequate pain  control and was doing very well.  Noted subsequent decreases in his pain  each day, and he was quite eager to be discharged to home.  On our  consultation, Mr. Guthridge is accompanied by his wife and is very  comfortable.  He states that his pain is well-controlled with oral pain  medication, denies any numbness or tingling but does complain of some  burning pain on the lateral aspect of his left ankle.  The external  fixator is in place, the distal pin is in  close proximity to the  traumatic wound, which has been closed primarily.  Overall, the external  fixator does look very stable with a Tax inspector.  Mr. Milanes denies  any chest pain or any shortness of breath.  He does report some  abdominal pain/left flank pain, which was due to his fall.  A CT scan  was obtained of his abdomen, which did not demonstrate any  intraabdominal processes.  A CT of his chest did demonstrate a mild to  moderate atelectasis with small pleural effusions bilaterally, no  pneumothorax was noted, and again no intraabdominal abnormalities were  appreciated on abdominal CT scan.  Mr. Mcpheeters does not have any  additional complaints at this time and appears to understand his current  circumstances and future plans as far as treating his injury.   PAST MEDICAL HISTORY:  Significant for hypercholesterolemia.   FAMILY HISTORY:  Father and sister with diabetes mellitus.   SOCIAL HISTORY:  The patient denies any tobacco use, occasional  alcohol  use about 1 drink a day.  Patient is a retired Curator for [employer].  No known drug allergies.   CURRENT MEDICATIONS:  Niacin, aspirin, multivitamin, and fish oil.   REVIEW OF SYSTEMS:  Negative except for those noted above in the HPI.   PHYSICAL EXAMINATION:  VITAL SIGNS:  Temperature 98.5, pulse 84,  respirations 20, and 94% on room air.  BP is 166/81.  GENERAL:  The patient is awake, alert, and is oriented to person, place,  and time, is in no acute distress, comfortable, accompanied by his  family members.  He appears well-nourished and well-developed and  appears stated age.  HEENT:  Head is atraumatic.  Extraocular muscles are intact.  External  auditory canals are clear.  Moist mucous membranes are noted.  NECK:  Supple with no lymphadenopathy and full active range of motion.  CHEST:  Clear with some diminished breath sounds at the bases.  HEART:  S1 and S2, is a regular rate and rhythm.  ABDOMEN:  Soft, decreased bowel sounds, nontender.  There is some  ecchymosis noted along the left flank that is appreciated with  inspection of the skin.  PELVIS:  No pain with palpation, no gross instability is elicited with  compression at the ASIS or iliac crest.  GU:  Deferred.  EXTREMITIES:  Bilateral upper extremities are free of gross deformities  and no crepitus or block motion is noted with passive ranging.  The  patient is able to actively move all his joints to full ranges of motion  without difficulty.  No tenderness is elicited with palpation of the  soft tissue or bony landmarks of the upper extremities bilaterally.  Radial, ulnar, median, and axillary nerve sensation is intact grossly to  light touch bilaterally.  Radial, ulnar, median, axillary, anterior  interosseous, and posterior interosseous nerve motor function is intact  grossly with motor testing bilaterally.  He does have 2+ radial pulses  bilaterally.  Extremities are appropriate color with gross  capillary  refill and the digits are warm.  The compartments are soft and nontender  as well.  The right lower extremity:  No gross deformities are noted  upon initial inspection.  The patient demonstrates a good range of  motion actively at the hip, knee, and ankle.  No crepitus or block  motion is appreciated.  Compartments are soft and nontender.  With  palpation, soft tissues also soft and nontender with palpation.  Palpation of bony landmarks does  not yield any tenderness.  No pain is  noted with axial loading of the hip joint nor with log rolling of the  right hip joint, either.  No gross instability is noted with examination  of the right knee or right ankle.  Deep peroneal nerves, superficial  peroneal nerve, and tibial nerve sensation is intact with light touch.  The distal right lower extremity EHL, FHL, anterior tibialis, posterior  tibialis, peroneals and gastrocs, and soleus complex are intact grossly  with motor testing.  The patient is able to actively fire his quads on  the right side as well.  The patient performs a heelslide, and a table  slide of his right leg showing good control with knee flexion.  Inspection of the left lower extremity is positive for an external  fixator stabilizing his distal left tib-fib fracture.  Upon removal of  the bandages, the traumatic wound has been closed primarily.  There is  some erythema, but no purulent drainage is noted.  There is some scant  drainage from his calcaneus transcalcaneal pin.  This is serous in  nature and is not purulent.  His tibial pins are intact and no drainage  is noted.  They are in close proximity to the traumatic wound as well.  The construct is a Tax inspector as well in a Delta frame  configuration.  There is a foot sling around the forefoot to help  maintain the ankle in a neutral position.  Examination of the left knee  does not demonstrate any gross deformity as well.  Axial loading of the  left hip does  not cause pain, and there is no pain with rolling of the  femur.  Deep peroneal nerve, superficial peroneal nerve, and tibial  nerve sensation is intact to light touch.  EHL and FHL motor function is  also intact.  The patient is able to actively extend his left leg as  well as flex his left knee.  He does demonstrate some hip flexion as  well when asked to do so and is able to do so without significant pain.  No deep calf tenderness is noted, his compartments are soft and  nontender, and he does have tenderness to palpation along the fracture  site.  There is some edema of his left foot noted, the extremity is  warm, a probable dorsalis pedis pulse is appreciated, brisk capillary  refill is also noted.   Preoperative x-rays as well as fluoroscopic images demonstrate a  severely comminuted left distal tib-fib fracture involving the distal  metaphysis of the left tibia and fibula.  CT scans of the pelvis,  abdomen, and chest are as follows:  Chest CT scan demonstrates bilateral  atelectasis both lung bases as well as small pleural effusions  bilaterally, no pneumothorax is noted.  A CT of the abdomen is negative  for any acute intraabdominal findings, and the CT of the pelvis  demonstrates a significant diverticulosis, but no free fluid or acute  abnormalities.   ASSESSMENT AND PLAN:  This is a 60 year old Caucasian male status post  fall off of a ladder on April 18, 2008, with an open grade 3A left  distal tibia and fibula fracture with extensive comminution and  metaphyseal involvement status post external fixation and I and D with  primary closure of traumatic wound, and OTA Classification 43-83.  1. Nonweightbearing left lower extremity.  2. Will start deep venous thrombosis prophylaxis with pharmacologic      means in addition  to mechanical prophylaxis with Lovenox 40 mg      subcutaneously daily.  3. We will plan for plain films and computerized tomography (CT) scan      of  his left ankle to further evaluate the fracture pattern and the      extent of the involvement.  4. May go to the operating room tomorrow for external fixator      revision/adjustment or wait until Tuesday for a more formal      fixation.  Probable fixation will be achieved likely with the use      of an intramedullary (IM) nail with locking distal holes to create      a fixed angle device securing the distal fragment, but this is an      extremely challenging fracture given its location, the wound, and      previous management. It may be limb threatening.  5. We will transfer the patient to the [facility] at this time.  6. In addition to gentamicin per pharmacy protocol, we will also      resume Ancef intravenously 1 g q.8 hours for continued      antimicrobial prophylaxis, and also for treatment with the concern      of possible early traumatic wound cellulitis given the redness      demonstrated about the wound.  We will also mark this area with a      skin marker to note any further expansion.  7. We will also initiate routine pin site care with gentle soap and      water as well as Kerlix to the pin sites to temp the skin to limit      motion at the pin-skin interface.   We greatly appreciate the opportunity to consult on this patient and  provide further management.      Mearl Latin, PA      Doralee Albino. Carola Frost, M.D.  Electronically Signed    KWP/MEDQ  D:  04/22/2008  T:  04/22/2008  Job:  [id number]

## 2010-09-10 NOTE — Op Note (Signed)
NAMEBAYRON, DALTO               ACCOUNT NO.:  [id number]   MEDICAL RECORD NO.:  [id number]          PATIENT TYPE:  INP   LOCATION:  5013                         FACILITY:  [facility]   PHYSICIAN:  Doralee Albino. Carola Frost, M.D. DATE OF BIRTH:  08-Apr-1951   DATE OF PROCEDURE:  04/24/2008  DATE OF DISCHARGE:                               OPERATIVE REPORT   PREOPERATIVE DIAGNOSIS:  Open left pilon tibia and fibula.   POSTOPERATIVE DIAGNOSIS:  Open left pilon tibia and fibula.   PROCEDURE:  1. Open reduction and internal fixation of left pilon fracture of      tibia and fibula.  2. External fixation revision.  3. Irrigation and debridement of open fracture with removal of bone,      subcutaneous tissue, muscle, and skin.  4. Infuse allografting.   SURGEON:  Doralee Albino. Carola Frost, MD   ASSISTANT:  Mearl Latin, PA   ANESTHESIA:  General.   COMPLICATIONS:  None.   TOURNIQUET:  None.   ESTIMATED BLOOD LOSS:  100 mL.   DISPOSITION:  To PACU.   CONDITION:  Stable.   BRIEF SUMMARY AND INDICATION FOR PROCEDURE:  Mr. Schauer is a 60 year old  male status post severely comminuted open tib-fib fracture with  extension of the fracture down into the tibial plafond.  Spanning  external fixator was placed at the time of the initial debridement.  Subsequent x-rays and physical examination demonstrated some angulation  at the fracture site as well as erythema and drainage which was  concerning for infection.  CT scan confirmed the gap at the fracture  site.  We discussed preoperatively the risks and benefits of surgery  including the possibility of infection, nerve injury, vessel injury,  failure to prevent infection, need for further surgery, DVT, PE, heart  attack, stroke, and multiple others.  After full discussion, he wished  to proceed as did his wife.  CAT scan confirmed inadequate bone distally  to allow for locking screw placement as well as of course the  possibility that we could further  displace the crack in the articular  surface.  The wound appearance precluded safe placement of the medial  plate and consequently, we were left with spanning external fixation  with limited internal fixation or a hybrid-type fixator.   DESCRIPTION OF PROCEDURE:  Mr. Unrein was administered preop antibiotics  and taken to the operating room where general anesthesia was induced.  His left lower extremity was prepped and draped in the usual sterile  fashion.  We then removed the external fixator all except for the  transcalcaneal pin and the proximal tibial pin.  I felt that the distal  tibial pin was too close to the fracture site and may be contaminating  the fracture hematoma.  Consequently, this was withdrawn and placed  proximally such that we could use a combination clamp.  Prior to placing  this, however, the old sutures were removed and the fracture site  entered and reopened.  Curettage of the open bone fragments was then  performed as well as some removal of devitalized cortical and cancellous  bone, adjacent  muscle fascia, subcutaneous fascia, and a very small  amount of skin.  Pulsatile lavage with 3 liters was then performed after  the sharp surgical debridement and curettage removing all debris and  hematoma.  This equipment was removed.  Gloves were changed.  There was  no purulence encountered.  However, some areas of the hematoma did have  the possibility of an infectious gross appearance and this was the area  sent for anaerobic and aerobic culture.  These cultures having been  sent, the instruments used for this portion of the procedure were set  aside and the clamps and bars sterilized for subsequent use in revision  external fixation.   I then made a small linear incision at the base of the fibula and placed  a 3-mm [employer] titanium rod retrograde after reducing the fracture.  This corrected our alignment and provided some stability but did not  restore length.  At  that time, I placed additional proximal pin into the  tibia, Combi clamp, and then applied traction with new clamps and bars.  Having to increase the size of the bar, we secured it at proper  distance.  That having been performed, I then placed 2 percutaneous  screws, one lateral to medial to secure the anterior syndesmotic complex  to the tibia and then another anterior-to-posterior to reduce the  distraction at the fracture site.  This did produce compression.  We  carefully controlled and maintained alignment as we secured all of the  pins and bars.  At that time, the fixator was then extended down to the  metatarsals medially and laterally in the first and in the fifth  respectively, taking the foot up into the equinus position.  The medial  tibia then underwent grafting with Infuse using 2 sponges, placing one  within the fracture site and the other over top, wrapping it around the  area of the defect.  No stripping was performed and all the fragments  that remained did have periosteal attachment.  The wound was closed in  standard layered fashion with 2-0 PDS and 3-0 simple nylon sutures as  well as far-near-near-far suture for the thoroughly irrigated old pin  site, leaving plenty of room for egress of drainage.  Sterile gently  compressive Ace wraps were applied.  The patient was awakened from  anesthesia and transported to PACU in stable condition.   Montez Morita, PAC, assisted me throughout the case with holding reduction  during instrumentation, applying and tightening fixator clamps while I  held reduction, and retraction during debridement in addition to  simultaneous wound closure. He was necessary to safe and effective  completion of the case.   PROGNOSIS:  Mr. Jarboe has had a severe open fracture of his left distal  tibia and fibula.  We were able to restore appropriate alignment,  rotation, and length as well as articular congruity and are hopeful this  fracture will go  on to unite without further interventions.  Should  further interventions be required, we would hope to at least have 2  large segments and perhaps could convert the fixator to a nail after a  fixator holiday should this be necessary.  We will move to obtain a bone  stimulator early to further increase her chances of union.  We will  continue to watch for infection and change the antibiotic coverage to  Unasyn pending these results, but at this time there does not appear to  be any gross evidence of infection.  Doralee Albino. Carola Frost, M.D.  Electronically Signed     MHH/MEDQ  D:  04/24/2008  T:  04/25/2008  Job:  [id number]

## 2010-09-10 NOTE — Discharge Summary (Signed)
[name], [name]               ACCOUNT NO.:  [id number]   MEDICAL RECORD NO.:  [id number]          PATIENT TYPE:  INP   LOCATION:  5013                         FACILITY:  [facility]   PHYSICIAN:  Doralee Albino. Carola Frost, M.D. DATE OF BIRTH:  11/25/1950   DATE OF ADMISSION:  04/22/2008  DATE OF DISCHARGE:  04/29/2008                               DISCHARGE SUMMARY   DISCHARGE DIAGNOSIS:  Open grade IIIA left pilon fracture, status post  open reduction and internal fixation with retention of external  fixation.   INITIAL DISCHARGE DIAGNOSIS:  Hypercholesterolemia.   BRIEF HISTORY AND HOSPITAL COURSE:  [name] is a very pleasant 60-  year-old gentleman who sustained an injury on April 18, 2008, while  he was at home working on __________.  The patient was up on a ladder  approximately 5 feet off the ground when he lost his balance resulting  in an injury to his left lower extremity.  The patient was initially  brought to [facility] where he was initially seen and treated  by Dr. Ranell Patrick, however, given the complexity of the injury, Dr. Ranell Patrick  sought consultation with Dr. Carola Frost of Orthopedic Trauma Service for  definitive management.  [name] remained at [facility] for  postoperative care from an initial I and D and internal fixation for the  open left pilon fracture by Dr. Ranell Patrick, and he was brought on to our  Orthopedic Trauma Service, he was then transferred to [facility] for continued treatment.  We initially saw Mr. Luten on  April 22, 2008, for initial assessment as well.  We did obtain  initial CT scan to further evaluate the pattern of the fracture to help  with operative findings.  Also at that time, we did continue IV  antibiotics in the open wound.  His antibiotic regimen included Unasyn  as well as gentamicin.  On April 24, 2008, the patient was brought to  the operating room for external fixation revision as well as open  treatment of left  pilon fracture of tibia and fibula with repeat  I and  D of the fracture with allografting of the bony defect.  Again, we did  retain the patient's external fixator to provide additional support  given the soft nature of his bone found intraoperatively.  [name]  course was relatively uncomplicated.  On postop day 1, [name] is  doing fantastic, not using his PCA.  His pain was controlled with oral  medication.  He is tolerating p.o. well.  He has requested his Foley out  on postop day #1, and then did not have any complaints.   LABORATORY DATA:  Stable.  He did take intraoperative cultures which  were still pending at that point in time.   PHYSICAL EXAMINATION:  Unremarkable.  PE of his left lower extremity and  his wounds look clean, dry, and intact.  All motor and sensory functions  were intact.  There was edema, but no additional pain noted.  Again,  prior to definitive surgery and after, [name] was placed on Lovenox  for  DVT prophylaxis in addition to mechanical methods.  Given Mr.  Hoen excellent progress, we did anticipate discharge home with home  health PT.  Again as noted [name] hospital stay was uncomplicated.  He did not demonstrate any evidence of adverse reaction to anesthesia  and he continued to work very well with physical therapy.  On postop day  2, traumatic wound did demonstrate what did contain some erythema around  the edges with slight tenderness, but overall the medial wound was good.  All pin sites were clean and dry.  His motor and sensory functions again  were intact.  Lines were once again unremarkable.  The patient did not  have any complaints.  In lieu of this traumatic wound, we did continue  with IV antibiotics, and we did anticipate continuation for proximal 72-  96 hours after the definitive closure.  On postop day #3 again, Mr.  Hennon had no complaints.  He was doing fantastic, progressed well with  PT.  Physical exam was  unremarkable.  Of note is that the erythema  looked to have improved somewhat along the traumatic wound, decreased  swelling in the left leg was noted.  Motor and sensory functions were  intact.  The patient was also tolerating IV antibiotics as well as  Lovenox well.  He continued to progress with physical therapy on a daily  basis.  We did anticipate that discharge to home on Saturday with home  health and continued p.o. antibiotic, which will include Augmentin for  another 7 days with follow up in our office in 7 days.  On April 29, 2008, the patient was deemed stable for discharge to home.  He was given  oral antibiotics and Lovenox for continued antibiotic prophylaxis as  well as DVT prophylaxis with instruction to perform daily dressing  changes as well as follow up in our office in a week.  The patient had  no complaints and was doing fantastic with therapy and was more than  ready for discharge to home.  Again, no issues with external fixator was  noted during hospital stay as well.   DISCHARGE MEDICATIONS:  1. Percocet 5/325 one to two every 4-6 hours as needed for pain.  2. Robaxin 500 mg 2 every 6 hours as needed.  3. Lovenox 40 mg 1 subcu injection x14 days.  4. Augmentin 500 mg once every 12 hours x7 days.  5. The patient may resume his home medications, resume aspirin 325 mg      1 p.o. daily.  6. Fish oil p.o. daily.  7. Niacin 250 mg 2 pills p.o. daily.  8. Multivitamin p.o. daily.   DISCHARGE INSTRUCTIONS AND PLANS:  [name] did not sustain any  significant injury to his left lower extremity.  Given severe open  nature of the fracture, there is concern for development of nonunion as  well as deep infection.  However, we are hopeful that he will go on to  heal.  We did achieve excellent fixation internally as well as which  would be bolstered by the external fixator.  We would also be able to  get the excellent irrigation and debridement of the traumatic wound,   which will hopefully heal without complication.  [name] will be  nonweightbearing on his left lower extremity for the next day 8 weeks or  so.  He will use a walker to assist with ambulation or crutches  whichever he deems most comfortable.  We will recommend continued  ice  and elevation of the left lower extremity to help with swelling and pain  control.  Mr. Bart will be discharged with home health PT, which she  should work on to help with mobilization as well as to help regain some  general conditioning.  We have provided Mr. Keir with explicit wound  care instructions, which should include daily dressing changes and  should observe and note any increased redness, drainage, pain, odor or  increased swelling around the traumatic wound site.  Should he note  this, he is to contact the office immediately.  Mr. Pfenning will follow  up in our office in the next 7 days for reevaluation and assessment of  his wound.  I will also obtain x-rays of his left lower extremity to  evaluate hardware placement and fracture alignment to make sure there  are no acute complications.  Mr. Greenup is to be on Lovenox for DVT  prophylaxis for 14 days.  Given his young age and lack of risk factors  for development of a DVT except major surgery with immobilization, I do  anticipate that he will be very mobile after 14 days and would not  require any additional pharmacologic DVT prophylaxis.  Should Mr. Kalafut  have any questions prior to his visit, he is to feel free to contact the  office.      Mearl Latin, PA      Doralee Albino. Carola Frost, M.D.  Electronically Signed    KWP/MEDQ  D:  07/28/2008  T:  07/29/2008  Job:  [id number]

## 2010-09-10 NOTE — Op Note (Signed)
[name], [name]               ACCOUNT NO.:  [id number]   MEDICAL RECORD NO.:  [id number]          PATIENT TYPE:  INP   LOCATION:  0111                         FACILITY:  [facility]   PHYSICIAN:  Almedia Balls. Ranell Patrick, M.D. DATE OF BIRTH:  09/09/1950   DATE OF PROCEDURE:  04/18/2008  DATE OF DISCHARGE:                               OPERATIVE REPORT   PREOPERATIVE DIAGNOSIS:  Grade 3 open left hip distal tibia/fibula  fracture.   POSTOPERATIVE DIAGNOSIS:  Grade 3 open left hip distal tibia/fibula  fracture with comminution.   PROCEDURE PERFORMED:  Irrigation and debridement of left open  tibia/fibula fracture as well as placement of external fixator.   SURGEON:  Almedia Balls. Ranell Patrick, M.D.   ASSISTANT:  Donnie Coffin. Dixon, PA-C   General anesthesia was used.   ESTIMATED BLOOD LOSS:  250 mL.   FLUID REPLACEMENT:  1000 mL crystalloid.   Instrument count was correct.  There were no complications.   Perioperative antibiotics were given including Ancef and gentamicin as  well as tetanus updated.   INDICATIONS:  The patient is a 60 year old male who suffered a 4-foot  fall off a ladder, injuring his left leg.  The patient presented to the  emergency room with an obvious open left tib-fib fracture with  significant displacement and entrapped sock.  The patient had a closed  reduction performed under sedation in the ED, and then subsequently  orthopedics was involved.  We brought the patient to surgery for an  urgent I and D and reduction and stabilization.   DESCRIPTION OF THE PROCEDURE:  After an adequate level of anesthesia was  achieved, the patient was positioned supine on the operating room table.  Nonsterile tourniquet was placed on the left proximal thigh.  Left leg  sterilely prepped and draped in the usual manner.  We did not use the  tourniquet or inflate it during the procedure.  We started out with  initial irrigation and sharp debridement, removing all devitalized soft  tissue  sharply, and with a pulse irrigation did 6 liters of normal  saline irrigation through the wound.  The bone ends were noted to be  clean.  There was significant comminution over a centimeter to 2-cm  length of the distal tibial fragment.  Multiple pieces of bone came out  of the wound.  There was no foreign debris noted within the wound.  After seeing the extensive comminution, I went ahead and made an  intraoperative phone call to Dr. Myrene Galas, orthopedic  traumatologist in [place], who recommended no internal fixation at  this point given the large medial laceration measuring greater than 10  cm in length starting at the midline anteriorly and going around  posteriorly to near the Achilles.  He recommended an external fixation  to maintain length and alignment.  I went ahead and called for the large  external fixator set by [employer].  At this point, following a thorough  irrigation we performed in-line traction and placed a Delta frame  external fixator using 5.0 Schantz pins in the tibia, a 9-inch centrally  threaded pin through the  calcaneus, and I went ahead and used standard  Delta frame construction with a very stable frame.  I then obtained  intraoperative fluoroscopy in both the AP and lateral planes to verify  appropriate alignment.  Again the comminution was more than I expected  and this goes down about 2 cm above the tibial plafond.  Overall  alignment looks good.  We then loosely closed the wound with interrupted  2-0 nylon suture in a near-far, far-near fashion with a bulky  compressive bandage.  The patient was taken to the recovery room in  stable condition.  He will be on IV antibiotics for 2 days.  I plan on  discharging him Friday.  Hopefully Dr. Carola Frost can either see him Friday  or potentially next week for delayed fixation of this fracture which has  the characteristics of a pilon fracture without the intra-articular  component.      Almedia Balls. Ranell Patrick,  M.D.  Electronically Signed     SRN/MEDQ  D:  04/18/2008  T:  04/19/2008  Job:  [id number]   cc:   Doralee Albino. Carola Frost, M.D.  Fax: [phone]

## 2010-10-03 ENCOUNTER — Encounter: Payer: Self-pay | Admitting: Internal Medicine

## 2010-10-04 ENCOUNTER — Other Ambulatory Visit: Payer: Self-pay

## 2010-10-11 ENCOUNTER — Encounter: Payer: Self-pay | Admitting: Internal Medicine

## 2010-11-27 ENCOUNTER — Other Ambulatory Visit: Payer: Self-pay

## 2010-12-04 ENCOUNTER — Encounter: Payer: Self-pay | Admitting: Internal Medicine

## 2011-01-24 ENCOUNTER — Other Ambulatory Visit (INDEPENDENT_AMBULATORY_CARE_PROVIDER_SITE_OTHER): Payer: Managed Care, Other (non HMO)

## 2011-01-24 DIAGNOSIS — Z0000 Encounter for general adult medical examination without abnormal findings: Secondary | ICD-10-CM

## 2011-01-24 LAB — BASIC METABOLIC PANEL
CO2: 30 mEq/L (ref 19–32)
Calcium: 9.1 mg/dL (ref 8.4–10.5)
Creatinine, Ser: 0.9 mg/dL (ref 0.4–1.5)
Glucose, Bld: 99 mg/dL (ref 70–99)
Sodium: 143 mEq/L (ref 135–145)

## 2011-01-24 LAB — POCT URINALYSIS DIPSTICK
Glucose, UA: NEGATIVE
Nitrite, UA: NEGATIVE
Urobilinogen, UA: 0.2

## 2011-01-24 LAB — LIPID PANEL
Cholesterol: 176 mg/dL (ref 0–200)
HDL: 41.2 mg/dL (ref >39.00)
Triglycerides: 94 mg/dL (ref 0.0–149.0)
VLDL: 18.8 mg/dL (ref 0.0–40.0)

## 2011-01-24 LAB — CBC WITH DIFFERENTIAL/PLATELET
Basophils Absolute: 0 10*3/uL (ref 0.0–0.1)
Eosinophils Absolute: 0.3 10*3/uL (ref 0.0–0.7)
Hemoglobin: 15.7 g/dL (ref 13.0–17.0)
Lymphocytes Relative: 21 % (ref 12.0–46.0)
MCHC: 33.1 g/dL (ref 30.0–36.0)
Neutro Abs: 4.8 10*3/uL (ref 1.4–7.7)
RDW: 14.2 % (ref 11.5–14.6)

## 2011-01-24 LAB — PSA: PSA: 3.92 ng/mL (ref 0.10–4.00)

## 2011-01-24 LAB — HEPATIC FUNCTION PANEL
Albumin: 3.9 g/dL (ref 3.5–5.2)
Alkaline Phosphatase: 54 U/L (ref 39–117)

## 2011-01-24 LAB — TSH: TSH: 1.34 u[IU]/mL (ref 0.35–5.50)

## 2011-01-31 ENCOUNTER — Ambulatory Visit (INDEPENDENT_AMBULATORY_CARE_PROVIDER_SITE_OTHER): Payer: Managed Care, Other (non HMO) | Admitting: Internal Medicine

## 2011-01-31 VITALS — BP 170/90 | HR 76 | Temp 98.2°F | Resp 16 | Ht 74.0 in | Wt 234.0 lb

## 2011-01-31 DIAGNOSIS — R972 Elevated prostate specific antigen [PSA]: Secondary | ICD-10-CM

## 2011-01-31 DIAGNOSIS — Z23 Encounter for immunization: Secondary | ICD-10-CM

## 2011-01-31 DIAGNOSIS — Z0000 Encounter for general adult medical examination without abnormal findings: Secondary | ICD-10-CM

## 2011-01-31 DIAGNOSIS — N419 Inflammatory disease of prostate, unspecified: Secondary | ICD-10-CM

## 2011-01-31 LAB — CBC
HCT: 34.5 % — ABNORMAL LOW (ref 39.0–52.0)
Hemoglobin: 11.4 g/dL — ABNORMAL LOW (ref 13.0–17.0)
Hemoglobin: 11.5 g/dL — ABNORMAL LOW (ref 13.0–17.0)
MCHC: 33.2 g/dL (ref 30.0–36.0)
MCHC: 33.2 g/dL (ref 30.0–36.0)
MCHC: 34.3 g/dL (ref 30.0–36.0)
MCV: 90.1 fL (ref 78.0–100.0)
Platelets: 213 10*3/uL (ref 150–400)
Platelets: 278 10*3/uL (ref 150–400)
RBC: 3.75 MIL/uL — ABNORMAL LOW (ref 4.22–5.81)
RBC: 3.64 MIL/uL — ABNORMAL LOW (ref 4.22–5.81)
RBC: 3.84 MIL/uL — ABNORMAL LOW (ref 4.22–5.81)
RDW: 13 % (ref 11.5–15.5)
RDW: 12.8 % (ref 11.5–15.5)
RDW: 12.9 % (ref 11.5–15.5)
RDW: 13 % (ref 11.5–15.5)
WBC: 8.4 10*3/uL (ref 4.0–10.5)
WBC: 9.9 10*3/uL (ref 4.0–10.5)

## 2011-01-31 LAB — URINALYSIS, ROUTINE W REFLEX MICROSCOPIC
Bilirubin Urine: NEGATIVE
Glucose, UA: NEGATIVE mg/dL
Ketones, ur: NEGATIVE mg/dL
Leukocytes, UA: NEGATIVE
Protein, ur: NEGATIVE mg/dL
pH: 5 (ref 5.0–8.0)

## 2011-01-31 LAB — URINE MICROSCOPIC-ADD ON

## 2011-01-31 LAB — BASIC METABOLIC PANEL
BUN: 16 mg/dL (ref 6–23)
CO2: 28 mEq/L (ref 19–32)
CO2: 28 mEq/L (ref 19–32)
CO2: 29 mEq/L (ref 19–32)
CO2: 29 mEq/L (ref 19–32)
Calcium: 7.8 mg/dL — ABNORMAL LOW (ref 8.4–10.5)
Calcium: 7.8 mg/dL — ABNORMAL LOW (ref 8.4–10.5)
Chloride: 104 mEq/L (ref 96–112)
Creatinine, Ser: 0.78 mg/dL (ref 0.4–1.5)
Creatinine, Ser: 0.86 mg/dL (ref 0.4–1.5)
Creatinine, Ser: 0.96 mg/dL (ref 0.4–1.5)
GFR calc Af Amer: >60 mL/min (ref >60)
GFR calc Af Amer: >60 mL/min (ref >60)
GFR calc Af Amer: >60 mL/min (ref >60)
GFR calc non Af Amer: >60 mL/min (ref >60)
GFR calc non Af Amer: >60 mL/min (ref >60)
GFR calc non Af Amer: >60 mL/min (ref >60)
Glucose, Bld: 109 mg/dL — ABNORMAL HIGH (ref 70–99)
Glucose, Bld: 121 mg/dL — ABNORMAL HIGH (ref 70–99)
Glucose, Bld: 132 mg/dL — ABNORMAL HIGH (ref 70–99)
Potassium: 4.3 mEq/L (ref 3.5–5.1)
Potassium: 3.7 mEq/L (ref 3.5–5.1)
Sodium: 139 mEq/L (ref 135–145)
Sodium: 135 mEq/L (ref 135–145)
Sodium: 140 mEq/L (ref 135–145)

## 2011-01-31 LAB — GENTAMICIN LEVEL, RANDOM: Gentamicin Rm: 3 ug/mL

## 2011-01-31 LAB — GRAM STAIN

## 2011-01-31 LAB — URINE CULTURE: Colony Count: NO GROWTH

## 2011-01-31 LAB — HEMOGLOBIN AND HEMATOCRIT, BLOOD
HCT: 35 % — ABNORMAL LOW (ref 39.0–52.0)
Hemoglobin: 12 g/dL — ABNORMAL LOW (ref 13.0–17.0)

## 2011-01-31 LAB — ANAEROBIC CULTURE

## 2011-01-31 LAB — PROTIME-INR: Prothrombin Time: 13 seconds (ref 11.6–15.2)

## 2011-01-31 LAB — APTT: aPTT: 27 seconds (ref 24–37)

## 2011-01-31 LAB — AFB CULTURE WITH SMEAR (NOT AT ARMC)

## 2011-01-31 LAB — WOUND CULTURE: Culture: NO GROWTH

## 2011-01-31 LAB — TYPE AND SCREEN

## 2011-01-31 MED ORDER — DOXYCYCLINE HYCLATE 100 MG PO TABS: 100.0000 mg | ORAL_TABLET | Freq: Two times a day (BID) | ORAL | Status: AC

## 2011-01-31 NOTE — Patient Instructions (Signed)
continue with the exercise and current medications as you've been prescribed  Or agreed to Keep watching weight You'll take an antibiotic for the next 30 days and we will check a PSA hopefully the PSA will be lower in the free PSA will be appropriate.

## 2011-04-03 ENCOUNTER — Ambulatory Visit (INDEPENDENT_AMBULATORY_CARE_PROVIDER_SITE_OTHER): Payer: Managed Care, Other (non HMO) | Admitting: Internal Medicine

## 2011-04-03 ENCOUNTER — Encounter: Payer: Self-pay | Admitting: Internal Medicine

## 2011-04-03 ENCOUNTER — Ambulatory Visit (INDEPENDENT_AMBULATORY_CARE_PROVIDER_SITE_OTHER)
Admission: RE | Admit: 2011-04-03 | Discharge: 2011-04-03 | Disposition: A | Discharge status: Home / Self Care | Payer: Managed Care, Other (non HMO) | Source: Ambulatory Visit

## 2011-04-03 DIAGNOSIS — E291 Testicular hypofunction: Secondary | ICD-10-CM

## 2011-04-03 DIAGNOSIS — M25572 Pain in left ankle and joints of left foot: Secondary | ICD-10-CM

## 2011-04-03 DIAGNOSIS — M810 Age-related osteoporosis without current pathological fracture: Secondary | ICD-10-CM

## 2011-04-03 DIAGNOSIS — R972 Elevated prostate specific antigen [PSA]: Secondary | ICD-10-CM

## 2011-04-03 DIAGNOSIS — N411 Chronic prostatitis: Secondary | ICD-10-CM

## 2011-04-03 DIAGNOSIS — M25579 Pain in unspecified ankle and joints of unspecified foot: Secondary | ICD-10-CM

## 2011-04-03 NOTE — Progress Notes (Signed)
Addended by: Rita Ohara R on: 04/03/2011 11:03 AM   Modules accepted: Orders

## 2011-04-03 NOTE — Progress Notes (Signed)
  Subjective:    Patient ID: [name], male    DOB: 10-10-1950, 60 y.o.   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  HENT: Negative for hearing loss, congestion, neck pain and postnasal drip.   Eyes: Negative for discharge, redness and visual disturbance.  Respiratory: Negative for cough, shortness of breath and wheezing.   Cardiovascular: Negative for leg swelling.  Gastrointestinal: Negative for abdominal pain, constipation and abdominal distention.  Genitourinary: Negative for urgency and frequency.  Musculoskeletal: Negative for joint swelling and arthralgias.  Skin: Negative for color change and rash.  Neurological: Negative for weakness and light-headedness.  Hematological: Negative for adenopathy.  Psychiatric/Behavioral: Negative for behavioral problems.       Objective:   Physical Exam  Constitutional: He appears well-developed and well-nourished.  HENT:  Head: Normocephalic and atraumatic.  Eyes: Conjunctivae are normal. Pupils are equal, round, and reactive to light.  Neck: Normal range of motion. Neck supple.  Cardiovascular: Normal rate and regular rhythm.   Pulmonary/Chest: Effort normal and breath sounds normal.  Abdominal: Soft. Bowel sounds are normal.   Prostate check Slight fullness in the right lobe         Assessment & Plan:  The pt has chronic ankle pain Refer to PT  To evaluate and treat Prostate is less boggy And not tender to touch PSA will be drawn today to monitor therapy.  A bone density is ordered to monitor for progression of osteopenia seen in 2010 Followup office visit is recommended in 3  months

## 2011-04-03 NOTE — Patient Instructions (Signed)
The patient is instructed to continue all medications as prescribed. Schedule followup with check out clerk upon leaving the clinic  

## 2011-04-04 LAB — TESTOSTERONE, FREE, TOTAL, SHBG
Sex Hormone Binding: 30 nmol/L (ref 13–71)
Testosterone, Free: 52.3 pg/mL (ref 47.0–244.0)
Testosterone: 255.55 ng/dL (ref 250–890)

## 2011-04-04 LAB — PSA, TOTAL AND FREE: PSA: 4.52 ng/mL — ABNORMAL HIGH (ref <=4.00)

## 2011-04-10 ENCOUNTER — Encounter: Payer: Self-pay | Admitting: Internal Medicine

## 2011-04-25 ENCOUNTER — Other Ambulatory Visit: Payer: Self-pay | Admitting: Internal Medicine

## 2011-04-25 ENCOUNTER — Telehealth: Payer: Self-pay | Admitting: *Deleted

## 2011-04-25 ENCOUNTER — Other Ambulatory Visit: Payer: Self-pay | Admitting: *Deleted

## 2011-04-25 DIAGNOSIS — R972 Elevated prostate specific antigen [PSA]: Secondary | ICD-10-CM

## 2011-04-25 NOTE — Telephone Encounter (Signed)
Pt was called with results from bone density and psa-- psa was elevated and dr Lovell Sheehan talked with pt and pt was referred to urologist- dr Vernie Ammons

## 2011-04-25 NOTE — Telephone Encounter (Signed)
Opened in error

## 2011-04-25 NOTE — Progress Notes (Signed)
Taken care of

## 2011-04-28 ENCOUNTER — Other Ambulatory Visit: Payer: Self-pay | Admitting: Internal Medicine

## 2011-04-28 ENCOUNTER — Encounter: Payer: Self-pay | Admitting: Internal Medicine

## 2011-04-28 NOTE — Progress Notes (Signed)
Subjective:    Patient ID: [name], male    DOB: 21-Aug-1950, 60 y.o.   MRN: [id number]  HPI Patient presents for complete physical examination.  Blood pressure is elevated again today at the time of his examination but a repeat blood pressure was 140/80 he states his home blood pressures are normal.  And physical the PSA was increased and we discussed appropriate therapeutic response History of hyperlipidemia on niacin and omega-3 fatty acids we discussed weight control and diet   Review of Systems  Constitutional: Negative for fever and fatigue.  HENT: Negative for hearing loss, congestion, neck pain and postnasal drip.   Eyes: Negative for discharge, redness and visual disturbance.  Respiratory: Negative for cough, shortness of breath and wheezing.   Cardiovascular: Negative for leg swelling.  Gastrointestinal: Negative for abdominal pain, constipation and abdominal distention.  Genitourinary: Negative for urgency and frequency.  Musculoskeletal: Negative for joint swelling and arthralgias.  Skin: Negative for color change and rash.  Neurological: Negative for weakness and light-headedness.  Hematological: Negative for adenopathy.  Psychiatric/Behavioral: Negative for behavioral problems.   Past Medical History  Diagnosis Date  . Obesity   . Hyperlipidemia   . Allergy   . Diverticulosis   . Hemorrhoids   . Hx of adenomatous colonic polyps   . DVT (deep venous thrombosis)     History   Social History  . Marital Status: Married    Spouse Name: N/A    Number of Children: N/A  . Years of Education: N/A   Occupational History  . retired    Social History Main Topics  . Smoking status: Never Smoker   . Smokeless tobacco: Not on file  . Alcohol Use: No  . Drug Use: No  . Sexually Active: Not on file   Other Topics Concern  . Not on file   Social History Narrative  . No narrative on file    Past Surgical History  Procedure Date  . Eye surgery   . Fx  l tibia,fibula with external fixator   . Remove fixator   . Varicocele repair   . Bilateral vein ligation     Family History  Problem Relation Age of Onset  . Arthritis    . Diabetes    . Stroke    . Coronary artery disease    . Heart disease    . Colon polyps Father     Allergies  Allergen Reactions  . Fentanyl     REACTION: N/V    Current Outpatient Prescriptions on File Prior to Visit  Medication Sig Dispense Refill  . aspirin 325 MG tablet Take 325 mg by mouth daily.        . Calcium-Vitamin D (CALTRATE 600 PLUS-VIT D PO) Take by mouth 2 (two) times daily.       . multivitamin (THERAGRAN) per tablet Take 1 tablet by mouth daily.        . niacin 500 MG tablet Take 500 mg by mouth 2 (two) times daily with a meal.        . Omega-3 Fatty Acids (FISH OIL) 1200 MG CAPS Take 3 capsules by mouth daily.          BP 170/90  Pulse 76  Temp 98.2 F (36.8 C)  Resp 16  Ht 6\' 2"  (1.88 m)  Wt 234 lb (106.142 kg)  BMI 30.04 kg/m2        Objective:   Physical Exam  Constitutional: He is oriented to person,  place, and time. He appears well-developed and well-nourished.  HENT:  Head: Normocephalic and atraumatic.  Eyes: Conjunctivae are normal. Pupils are equal, round, and reactive to light.  Neck: Normal range of motion. Neck supple.  Cardiovascular: Normal rate and regular rhythm.   Pulmonary/Chest: Effort normal and breath sounds normal.  Abdominal: Soft. Bowel sounds are normal.  Genitourinary: Rectum normal and prostate normal.  Musculoskeletal: Normal range of motion. He exhibits edema and tenderness.  Neurological: He is alert and oriented to person, place, and time.  Skin: Skin is warm and dry.       Varicose veins  Psychiatric: He has a normal mood and affect. His behavior is normal.          Assessment & Plan:  Patient presents for complete physical exam  Patient presents for yearly preventative medicine examination.   all immunizations and health  maintenance protocols were reviewed with the patient and they are up to date with these protocols.   screening laboratory values were reviewed with the patient including screening of hyperlipidemia PSA renal function and hepatic function.   There medications past medical history social history problem list and allergies were reviewed in detail.   Goals were established with regard to weight loss exercise diet in compliance with medications   PSA was detected during his physical examination and we discussed the implications of this with the patient we will first try a course of antibiotics if the PSA does not respond to the antibiotics and he will be referred to a urologist for evaluation and possible biopsy.  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 He has a history of elevated blood pressure at the time of examination which has been characterized as white coat syndrome

## 2011-04-29 HISTORY — PX: PROSTATECTOMY: SHX69

## 2011-06-18 DIAGNOSIS — C61 Malignant neoplasm of prostate: Secondary | ICD-10-CM

## 2011-06-18 HISTORY — DX: Malignant neoplasm of prostate: C61

## 2011-07-03 ENCOUNTER — Encounter: Payer: Self-pay | Admitting: Internal Medicine

## 2011-07-03 ENCOUNTER — Ambulatory Visit (INDEPENDENT_AMBULATORY_CARE_PROVIDER_SITE_OTHER): Payer: Managed Care, Other (non HMO) | Admitting: Internal Medicine

## 2011-07-03 VITALS — BP 142/84 | HR 76 | Temp 98.1°F | Resp 16 | Ht 72.0 in | Wt 258.0 lb

## 2011-07-03 DIAGNOSIS — C61 Malignant neoplasm of prostate: Secondary | ICD-10-CM

## 2011-07-03 NOTE — Progress Notes (Signed)
Subjective:    Patient ID: [name], male    DOB: 10-28-1950, 61 y.o.   MRN: [id number]  HPI Has been to three session for vein therapy and has been started on lazer therapy [facility]. Ear pressure is stable. He has been diagnosed with prostate cancer and he has been doing a lot of research about what his options are for treatment.  He is considering alternative treatments including those at specialized treatment centers that used a gamma knife-type treatment. With his urologist and in [place] he has the following Two options: seeds and total prostate.    Review of Systems  Constitutional: Negative for fever and fatigue.  HENT: Negative for hearing loss, congestion, neck pain and postnasal drip.   Eyes: Negative for discharge, redness and visual disturbance.  Respiratory: Negative for cough, shortness of breath and wheezing.   Cardiovascular: Negative for leg swelling.  Gastrointestinal: Negative for abdominal pain, constipation and abdominal distention.  Genitourinary: Negative for urgency and frequency.  Musculoskeletal: Negative for joint swelling and arthralgias.  Skin: Negative for color change and rash.  Neurological: Negative for weakness and light-headedness.  Hematological: Negative for adenopathy.  Psychiatric/Behavioral: Negative for behavioral problems.   Past Medical History  Diagnosis Date  . Obesity   . Hyperlipidemia   . Allergy   . Diverticulosis   . Hemorrhoids   . Hx of adenomatous colonic polyps   . DVT (deep venous thrombosis)   . 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 Rhinitis     History   Social History  . Marital Status: Married    Spouse Name: N/A    Number of Children: N/A  . Years of Education: N/A   Occupational History  . retired    Social History Main Topics  . Smoking status: Never Smoker   . Smokeless tobacco: Not on file  . Alcohol Use: No  . Drug Use: No  . Sexually Active:  Not on file   Other Topics Concern  . Not on file   Social History Narrative  . No narrative on file    Past Surgical History  Procedure Date  . Eye surgery   . Fx l tibia,fibula with external fixator   . Remove fixator   . Varicocele repair   . Bilateral vein ligation     Family History  Problem Relation Age of Onset  . Arthritis    . Diabetes    . Stroke    . Coronary artery disease    . Heart disease    . Colon polyps Father     Allergies  Allergen Reactions  . Fentanyl     REACTION: N/V    Current Outpatient Prescriptions on File Prior to Visit  Medication Sig Dispense Refill  . aspirin 325 MG tablet Take 325 mg by mouth daily.          BP 142/84  Pulse 76  Temp 98.1 F (36.7 C)  Resp 16  Ht 6' (1.829 m)  Wt 258 lb (117.028 kg)  BMI 34.99 kg/m2       Objective:   Physical Exam  Nursing note and vitals reviewed. Constitutional: He appears well-developed and well-nourished.  HENT:  Head: Normocephalic and atraumatic.  Eyes: Conjunctivae are normal. Pupils are equal, round, and reactive to light.  Neck: Normal range of motion. Neck supple.  Cardiovascular: Normal rate and regular rhythm.   Pulmonary/Chest: Effort normal and breath sounds normal.  Abdominal: Soft. Bowel sounds are  normal.          Assessment & Plan:  We spent 30 minutes with the patient and the patient's wife discussing the options for treatment of his prostate cancer looking at liver to further tear brought with him as well as discussing different surgery centers and radiation  treatment centers. He is leaning towards the rectum and radiation rather than seek placement or a radical prostatectomy at this time based upon his research we are not as familiar with this line of treatment since it is specialized in only a few centers we asked him to discuss this in detail with the urologist as well.

## 2011-07-03 NOTE — Patient Instructions (Signed)
The patient is instructed to continue all medications as prescribed. Schedule followup with check out clerk upon leaving the clinic  

## 2011-07-22 ENCOUNTER — Encounter: Payer: Self-pay | Admitting: *Deleted

## 2011-07-23 ENCOUNTER — Ambulatory Visit: Payer: Managed Care, Other (non HMO) | Admitting: Radiation Oncology

## 2011-07-23 ENCOUNTER — Ambulatory Visit: Payer: Managed Care, Other (non HMO)

## 2011-07-28 ENCOUNTER — Ambulatory Visit: Payer: Managed Care, Other (non HMO) | Admitting: Internal Medicine

## 2011-10-03 MED ORDER — ATENOLOL 25 MG PO TABS: 25.0000 mg | ORAL_TABLET | Freq: Every day | ORAL | Status: DC

## 2011-10-20 ENCOUNTER — Telehealth: Payer: Self-pay | Admitting: Internal Medicine

## 2011-10-20 NOTE — Telephone Encounter (Signed)
Caller: [name]/Patient; PCP: Darryll Capers; CB#: [phone];  Call regarding BPH and B/P Concerns.  pt started on Atenolol 25 mg 10/04/11 and now is having lower B/P.  He will have prostate surgery 11/10/11 and he is losing weight.  Pt wants to see about getting off of the Atenolol.  Please call pt to advise.

## 2011-10-20 NOTE — Telephone Encounter (Signed)
Per dr Lovell Sheehan- take 1/2 of atenolol for 4 days and if he continues to feel fatigued may stop ,but continue to monitor-pt  informed

## 2011-10-24 ENCOUNTER — Telehealth: Payer: Self-pay | Admitting: Internal Medicine

## 2011-10-24 NOTE — Telephone Encounter (Signed)
Caller: [name]/Patient; PCP: Darryll Capers; CB#: [phone]. Call regarding Questions About Blood Pressure Meds. Note of 6/24 call reviewed. Caller reports he rec'd a call from [place] on Monday 6/24 after calling the office about his Altenolol. He was advised to cut pill in half for 5 day and then stop it. Caller is on 4th day and blood pressure still running low. Caller asking if he is going to be adding any meds in place of the Altenolol. Caller reminding staff of his upcoming surgery at [facility] on 7/15. Caller advised this RN would send note for a call back at above #.

## 2011-10-24 NOTE — Telephone Encounter (Signed)
May stop adn continue to monitor

## 2011-11-20 ENCOUNTER — Emergency Department (HOSPITAL_COMMUNITY)
Admission: EM | Admit: 2011-11-20 | Discharge: 2011-11-20 | Disposition: A | Discharge status: Home / Self Care | Payer: Managed Care, Other (non HMO) | Attending: Emergency Medicine | Admitting: Emergency Medicine

## 2011-11-20 DIAGNOSIS — T8131XA Disruption of external operation (surgical) wound, not elsewhere classified, initial encounter: Secondary | ICD-10-CM | POA: Insufficient documentation

## 2011-11-20 DIAGNOSIS — Z8546 Personal history of malignant neoplasm of prostate: Secondary | ICD-10-CM | POA: Insufficient documentation

## 2011-11-20 DIAGNOSIS — Z86718 Personal history of other venous thrombosis and embolism: Secondary | ICD-10-CM | POA: Insufficient documentation

## 2011-11-20 DIAGNOSIS — Z7982 Long term (current) use of aspirin: Secondary | ICD-10-CM | POA: Insufficient documentation

## 2011-11-20 DIAGNOSIS — Z9079 Acquired absence of other genital organ(s): Secondary | ICD-10-CM | POA: Insufficient documentation

## 2011-11-20 DIAGNOSIS — Y838 Other surgical procedures as the cause of abnormal reaction of the patient, or of later complication, without mention of misadventure at the time of the procedure: Secondary | ICD-10-CM | POA: Insufficient documentation

## 2011-11-20 DIAGNOSIS — E785 Hyperlipidemia, unspecified: Secondary | ICD-10-CM | POA: Insufficient documentation

## 2011-11-20 LAB — BASIC METABOLIC PANEL
BUN: 18 mg/dL (ref 6–23)
Calcium: 9 mg/dL (ref 8.4–10.5)
GFR calc Af Amer: >90 mL/min (ref >90)
GFR calc non Af Amer: 86 mL/min — ABNORMAL LOW (ref >90)
Potassium: 3.4 mEq/L — ABNORMAL LOW (ref 3.5–5.1)
Sodium: 141 mEq/L (ref 135–145)

## 2011-11-20 LAB — CBC
MCH: 28.3 pg (ref 26.0–34.0)
MCHC: 34.1 g/dL (ref 30.0–36.0)
RDW: 14.4 % (ref 11.5–15.5)

## 2011-11-20 MED ORDER — CEPHALEXIN 250 MG PO CAPS: 250.0000 mg | ORAL_CAPSULE | Freq: Four times a day (QID) | ORAL | Status: DC

## 2011-11-20 NOTE — ED Notes (Addendum)
Wound care performed by Dr.Knapp .

## 2011-11-20 NOTE — ED Notes (Signed)
Pt. Claimed that he had prostatectomy on the 15th of July at [place] , [place]. Drove back home yesterday and notice this 5pm that lower incision/ surgical wound opened with some redness around the site. Denies of any pain. No bleeding reported.

## 2011-11-20 NOTE — ED Provider Notes (Signed)
History     CSN: [id number]  Arrival date & time 11/20/11  1744   First MD Initiated Contact with Patient 11/20/11 1929      Chief Complaint  Patient presents with  . Wound Dehiscence  . Post-op Problem     HPI Pt had surgery on July 15th at the [facility] for a prostatectomy.  He had the staple removed a few days ago.  Patient was driving back from home yesterday but today he noticed that the lower aspect. His surgical incision had opened up. Patient has noticed some redness around the area. He denies any increasing pain or purulent discharge. He denies any bleeding or fevers. He called his doctors at the [facility] and was told to come to the emergency room for evaluation Past Medical History  Diagnosis Date  . Obesity   . Hyperlipidemia   . Allergy   . Diverticulosis   . Hemorrhoids   . Hx of adenomatous colonic polyps   . DVT (deep venous thrombosis)   . 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 Rhinitis     Past Surgical History  Procedure Date  . Eye surgery   . Fx l tibia,fibula with external fixator   . Remove fixator   . Varicocele repair   . Bilateral vein ligation     Family History  Problem Relation Age of Onset  . Arthritis    . Diabetes    . Stroke    . Coronary artery disease    . Heart disease    . Colon polyps Father     History  Substance Use Topics  . Smoking status: Never Smoker   . Smokeless tobacco: Not on file  . Alcohol Use: No      Review of Systems  All other systems reviewed and are negative.    Allergies  Fentanyl and Levofloxacin  Home Medications   Current Outpatient Rx  Name Route Sig Dispense Refill  . ASPIRIN 325 MG PO TABS Oral Take 325 mg by mouth daily.      . ATENOLOL 25 MG PO TABS Oral Take 1 tablet (25 mg total) by mouth daily. 90 tablet 3  . VITAMIN D3 2000 UNITS PO TABS Oral Take 2,000 Units by mouth daily.    Laroy Apple NIACINATE 500 MG PO TABS Oral Take 500 mg by  mouth. 2 every day    . LYCOPENE 10 MG PO CAPS Oral Take 10 mg by mouth. 2 in am and 1 in pm    . MULTI-VITAMIN DAILY PO Oral Take by mouth daily.    . NON FORMULARY  1,000 mg. Calcium-magnesium 1000mg  calcium 500mg  phosphorus and magnesium--1 in am and 1 in pm    . FISH OIL CONCENTRATE 1000 MG PO CAPS Oral Take 1,000 mg by mouth daily. Fish oil concentrate-super omega 3 gems 1000mg  --take 2 per day      BP 156/78  Pulse 73  Temp 98.2 F (36.8 C) (Oral)  Resp 19  Ht 6\' 1"  (1.854 m)  Wt 240 lb (108.863 kg)  BMI 31.66 kg/m2  SpO2 97%  Physical Exam  Nursing note and vitals reviewed. Constitutional: He appears well-developed and well-nourished. No distress.  HENT:  Head: Normocephalic and atraumatic.  Right Ear: External ear normal.  Left Ear: External ear normal.  Eyes: Conjunctivae are normal. Right eye exhibits no discharge. Left eye exhibits no discharge. No scleral icterus.  Neck: Neck supple. No tracheal deviation present.  Cardiovascular: Normal  rate, regular rhythm and normal heart sounds.   Pulmonary/Chest: Effort normal and breath sounds normal. No stridor. No respiratory distress.  Abdominal: He exhibits no distension and no mass. There is no tenderness. There is no rebound and no guarding.        Midline surgical wound inferior to the umbilicus and superior to The pubic bone, approximately 5 cm area of wound dehiscence, healthy adipose tissue visualized at the base of the wound proximally one 2 cm separation of the tissue  Musculoskeletal: He exhibits no edema.  Neurological: He is alert. Cranial nerve deficit: no gross deficits.  Skin: Skin is warm and dry. No rash noted.  Psychiatric: He has a normal mood and affect.    ED Course  Procedures (including critical care time) The wound was irrigated with normal saline. I packed the wound with sterile gauze and applied Steri-Strips over the wound to reapproximate slightly. Patient was given an abdominal binder. Labs  Reviewed  CBC - Abnormal; Notable for the following:    RBC 3.81 (*)     Hemoglobin 10.8 (*)     HCT 31.7 (*)     All other components within normal limits  BASIC METABOLIC PANEL - Abnormal; Notable for the following:    Potassium 3.4 (*)     Glucose, Bld 130 (*)     GFR calc non Af Amer 86 (*)     All other components within normal limits   No results found.   1. Wound dehiscence, surgical       MDM  Patient may have had a seroma that opened up. He may have some wound infection as well as there is erythema around the edges.  I will give the patient a referral to the wound clinic here. We'll give him a prescription for antibiotics and discussed close followup.        Celene Kras, MD 11/20/11 [phone]

## 2011-11-21 ENCOUNTER — Encounter (HOSPITAL_BASED_OUTPATIENT_CLINIC_OR_DEPARTMENT_OTHER): Payer: Managed Care, Other (non HMO) | Attending: General Surgery

## 2011-11-21 DIAGNOSIS — Y836 Removal of other organ (partial) (total) as the cause of abnormal reaction of the patient, or of later complication, without mention of misadventure at the time of the procedure: Secondary | ICD-10-CM | POA: Insufficient documentation

## 2011-11-21 DIAGNOSIS — L02219 Cutaneous abscess of trunk, unspecified: Secondary | ICD-10-CM | POA: Insufficient documentation

## 2011-11-21 DIAGNOSIS — Z86718 Personal history of other venous thrombosis and embolism: Secondary | ICD-10-CM | POA: Insufficient documentation

## 2011-11-21 DIAGNOSIS — T8189XA Other complications of procedures, not elsewhere classified, initial encounter: Secondary | ICD-10-CM | POA: Insufficient documentation

## 2011-11-21 DIAGNOSIS — C61 Malignant neoplasm of prostate: Secondary | ICD-10-CM | POA: Insufficient documentation

## 2011-11-21 DIAGNOSIS — L03319 Cellulitis of trunk, unspecified: Secondary | ICD-10-CM | POA: Insufficient documentation

## 2011-11-21 NOTE — Progress Notes (Signed)
[facility]  NAME:  [name], [name]               ACCOUNT NO.:  [id number]  MEDICAL RECORD NO.:  [id number]      DATE OF BIRTH:  1950/08/14  PHYSICIAN:  Ardath Sax, M.D.           VISIT DATE:                                  OFFICE VISIT   This is a 61 year old gentleman who is moderately obese.  He enters our clinic because of a midline abdominal wound from a prostatectomy that he underwent at [facility] on July 15.  The sutures were removed and later the wound dehisced.  He has a fair amount of cellulitis around it, but the wound itself shows clean subcutaneous tissue.  It is about 2 inches long in the opening in 1 inch deep.  He has a history of obesity, diverticulosis, hemorrhoids, colonic polyps, deep venous thrombosis.  He had bilateral vein ligations after that.  He had some eye surgery, and then he had this prostate cancer removed at the [facility].  His blood pressure is 120/80, his temperature is 98, his pulse is 76.  He is alert, cooperative, and his only problem is wound.  I felt that if the wound was loosely approximated, which I did with some 2-0 chromic suture.  I put 3 sutures in this wound just to bring the edges together and then we put silver alginate in between the sutures and we got a culture.  He is already on Keflex, so we will continue the Keflex 500 mg 3 times a day, and he will come back here in a week.  In the meantime, his wife will change the dressing every day, and we gave him some silver alginate to put in the wound.  He will come back in a week.     Ardath Sax, M.D.     PP/MEDQ  D:  11/21/2011  T:  11/21/2011  Job:  [id number]

## 2011-11-27 ENCOUNTER — Encounter (HOSPITAL_COMMUNITY): Payer: Self-pay | Admitting: Family Medicine

## 2011-11-27 ENCOUNTER — Emergency Department (HOSPITAL_COMMUNITY)
Admission: EM | Admit: 2011-11-27 | Discharge: 2011-11-27 | Disposition: A | Discharge status: Home / Self Care | Payer: Managed Care, Other (non HMO) | Attending: Emergency Medicine | Admitting: Emergency Medicine

## 2011-11-27 DIAGNOSIS — Z8546 Personal history of malignant neoplasm of prostate: Secondary | ICD-10-CM | POA: Insufficient documentation

## 2011-11-27 DIAGNOSIS — E785 Hyperlipidemia, unspecified: Secondary | ICD-10-CM | POA: Insufficient documentation

## 2011-11-27 DIAGNOSIS — Z86718 Personal history of other venous thrombosis and embolism: Secondary | ICD-10-CM | POA: Insufficient documentation

## 2011-11-27 DIAGNOSIS — R339 Retention of urine, unspecified: Secondary | ICD-10-CM | POA: Insufficient documentation

## 2011-11-27 LAB — URINALYSIS, ROUTINE W REFLEX MICROSCOPIC
Bilirubin Urine: NEGATIVE
Nitrite: NEGATIVE
Protein, ur: NEGATIVE mg/dL
Specific Gravity, Urine: 1.021 (ref 1.005–1.030)
Urobilinogen, UA: 0.2 mg/dL (ref 0.0–1.0)

## 2011-11-27 LAB — URINE MICROSCOPIC-ADD ON

## 2011-11-27 MED ORDER — LIDOCAINE HCL 2 % EX GEL
CUTANEOUS | Status: AC
  Administered 2011-11-27: 04:00:00
  Filled 2011-11-27: qty 10

## 2011-11-27 NOTE — ED Provider Notes (Signed)
Medical screening examination/treatment/procedure(s) were conducted as a shared visit with non-physician practitioner(s) and myself. I personally evaluated the patient during the encounter  Physical Exam  Constitutional: He is oriented to person, place, and time. He appears well-developed and well-nourished.  HENT:  Head: Normocephalic.  Eyes: EOM are normal.  Neck: Normal range of motion.  Pulmonary/Chest: Effort normal.  Abdominal: He exhibits no distension. There is no tenderness. There is no rebound.  Musculoskeletal: Normal range of motion.  Neurological: He is alert and oriented to person, place, and time.  Psychiatric: He has a normal mood and affect.  Acute urinary retention with complete resolution after Foley catheter placement. Foley was placed without any difficulty. Urine is yellow. Urology followup.   Lyanne Co, MD 11/27/11 [phone]

## 2011-11-27 NOTE — ED Notes (Addendum)
Patient had prostate removed on 7/13 at [facility]. States he has been having dysuria since 7pm. States not a normal stream. Foley cath was removed on 7/23. Bladder scan done at triage showed 615 ml post void of 25 ml.

## 2011-11-27 NOTE — ED Provider Notes (Signed)
History     CSN: [id number]  Arrival date & time 11/27/11  3086   First MD Initiated Contact with Patient 11/27/11 0406      Chief Complaint  Patient presents with  . Urinary Retention    (Consider location/radiation/quality/duration/timing/severity/associated sxs/prior treatment) HPI Comments: Recent states he had a prostatectomy at the [facility] clinic on July 15.  He was doing well until he was seen in this emergency room on July 25 when he had a wound dehiscent.  This was resutured by a wound care, physician.  He was doing well until tonight when he developed dysuria and frequency.  He states he is having trouble starting his stream and maintaining his stream.  He denies fever, nausea, vomiting, diarrhea,   The history is provided by the patient.    Past Medical History  Diagnosis Date  . Obesity   . Hyperlipidemia   . Allergy   . Diverticulosis   . Hemorrhoids   . Hx of adenomatous colonic polyps   . DVT (deep venous thrombosis)   . 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 Rhinitis   . Prostate cancer     Past Surgical History  Procedure Date  . Eye surgery   . Fx l tibia,fibula with external fixator   . Remove fixator   . Varicocele repair   . Bilateral vein ligation   . Prostatectomy     Family History  Problem Relation Age of Onset  . Arthritis    . Diabetes    . Stroke    . Coronary artery disease    . Heart disease    . Colon polyps Father     History  Substance Use Topics  . Smoking status: Never Smoker   . Smokeless tobacco: Not on file  . Alcohol Use: No      Review of Systems  Constitutional: Negative for fever and chills.  Gastrointestinal: Positive for abdominal distention. Negative for abdominal pain.  Genitourinary: Positive for dysuria and difficulty urinating. Negative for frequency, hematuria and flank pain.  Skin: Positive for wound.    Allergies  Adhesive; Fentanyl; Latex; and Levofloxacin  Home  Medications   Current Outpatient Rx  Name Route Sig Dispense Refill  . ACETAMINOPHEN 325 MG PO TABS Oral Take 650 mg by mouth every 4 (four) hours as needed. Pain.    . ASPIRIN EC 81 MG PO TBEC Oral Take 81 mg by mouth daily.    Marland Kitchen CALCIUM PO Oral Take 1 tablet by mouth 2 (two) times daily.     . CEPHALEXIN 500 MG PO CAPS Oral Take 500 mg by mouth 3 (three) times daily. Stop date  12/01/11    . VITAMIN D3 2000 UNITS PO TABS Oral Take 2,000 Units by mouth daily.    . ADULT MULTIVITAMIN W/MINERALS CH Oral Take 1 tablet by mouth daily.    Marland Kitchen NIACIN ER 500 MG PO TBCR Oral Take 500 mg by mouth 2 (two) times daily with a meal.    . FISH OIL CONCENTRATE 1000 MG PO CAPS Oral Take 1,000 mg by mouth 2 (two) times daily. Fish oil concentrate-super omega 3 gems 1000mg  --take 2 per day    . TADALAFIL 5 MG PO TABS Oral Take 5 mg by mouth daily as needed. For erectile dysfunction      BP 123/76  Pulse 82  Temp 98.9 F (37.2 C) (Oral)  Resp 20  SpO2 100%  Physical Exam  Constitutional: He appears well-developed and  well-nourished.  Neck: Normal range of motion.  Cardiovascular: Normal rate.   Pulmonary/Chest: Effort normal.  Abdominal: He exhibits distension. There is tenderness. There is no rebound and no guarding.       Healing surgical scar with slight erythema, minimal drainage, very base of the surgical site Bladder was scanned revealing 600 cc plus in the bladder  Musculoskeletal: Normal range of motion.  Skin: Skin is warm and dry.    ED Course  Procedures (including critical care time)  Labs Reviewed  URINALYSIS, ROUTINE W REFLEX MICROSCOPIC - Abnormal; Notable for the following:    Hgb urine dipstick SMALL (*)     All other components within normal limits  URINE MICROSCOPIC-ADD ON - Abnormal; Notable for the following:    Bacteria, UA FEW (*)     All other components within normal limits   No results found.   1. Urinary retention       MDM   Will Have Nursing place  Foley  catheter send urine sample        Arman Filter, NP 11/27/11 [phone]

## 2011-11-27 NOTE — ED Provider Notes (Signed)
Medical screening examination/treatment/procedure(s) were conducted as a shared visit with non-physician practitioner(s) and myself. I personally evaluated the patient during the encounter  Physical Exam  Constitutional: He is oriented to person, place, and time. He appears well-developed and well-nourished.  HENT:  Head: Normocephalic.  Eyes: EOM are normal.  Neck: Normal range of motion.  Pulmonary/Chest: Effort normal.  Abdominal: He exhibits no distension. There is no tenderness. There is no rebound.  Musculoskeletal: Normal range of motion.  Neurological: He is alert and oriented to person, place, and time.  Psychiatric: He has a normal mood and affect.  Acute urinary retention with complete resolution after Foley catheter placement. Foley was placed without any difficulty. Urine is yellow. Urology followup.   Ryin Schillo M Marbella Markgraf, MD 11/27/11 0524 

## 2011-11-27 NOTE — ED Notes (Signed)
Pt c/o urinary retention, s/p prostatectomy 11/10/11. in bladder after 25ml void.  Urojet used prior to foley insertion, no resistance met during insertion, no bleeding noted

## 2011-11-28 ENCOUNTER — Encounter (HOSPITAL_BASED_OUTPATIENT_CLINIC_OR_DEPARTMENT_OTHER): Payer: Managed Care, Other (non HMO) | Attending: General Surgery

## 2011-11-28 DIAGNOSIS — L98499 Non-pressure chronic ulcer of skin of other sites with unspecified severity: Secondary | ICD-10-CM | POA: Insufficient documentation

## 2011-12-02 NOTE — Progress Notes (Signed)
[facility]  NAME:  [name], [name]               ACCOUNT NO.:  [id number]  MEDICAL RECORD NO.:  [id number]      DATE OF BIRTH:  05-06-1950  PHYSICIAN:  Wayland Denis, DO       VISIT DATE:  12/01/2011                                  OFFICE VISIT   HISTORY OF PRESENT ILLNESS:  The patient is a 61 year old gentleman who is here for followup on his abdominal ulcer after undergoing prostatectomy.  The area apparently looks much better than it did last week.  He has got good granulation tissue, pink at the base.  There is no drainage noted or periwound swelling, or infection evident.  MEDICATIONS:  Aspirin, niacin, calcium, vitamin D, multivitamin, fish oil, _________ and he was switched from the Keflex to the Bactrim.  ALLERGIES:  FENTANYL, LEVAQUIN, LATEX, and ADHESIVE.  PHYSICAL EXAMINATION:  GENERAL:  He is alert, oriented, cooperative, not in any acute distress.  He is pleasant. HEENT:  Pupils are equal.  Extraocular muscles intact. NECK:  No cervical lymphadenopathy. LUNGS:  Breathing is unlabored. HEART:  Regular. ABDOMEN:  Soft.  Tender around the site, but improving, and as described above.  Recommend continuing with local wound care with collagen and he may be a candidate for Oasis or [facility]Cell depending on how it looks when he gets back from his visit to [facility].     Wayland Denis, DO     CS/MEDQ  D:  12/01/2011  T:  12/02/2011  Job:  [id number]

## 2011-12-05 ENCOUNTER — Ambulatory Visit: Payer: Managed Care, Other (non HMO) | Admitting: Internal Medicine

## 2011-12-19 ENCOUNTER — Encounter (HOSPITAL_BASED_OUTPATIENT_CLINIC_OR_DEPARTMENT_OTHER): Payer: Managed Care, Other (non HMO)

## 2012-01-20 NOTE — Progress Notes (Signed)
  Subjective:    Patient ID: [name], male    DOB: November 27, 1950, 61 y.o.   MRN: [id number]  HPI  Opened in error  Review of Systems     Objective:   Physical Exam        Assessment & Plan:

## 2012-01-23 ENCOUNTER — Ambulatory Visit: Payer: Managed Care, Other (non HMO) | Admitting: Internal Medicine

## 2012-02-26 ENCOUNTER — Ambulatory Visit: Payer: Managed Care, Other (non HMO) | Admitting: Internal Medicine

## 2012-02-26 DIAGNOSIS — Z23 Encounter for immunization: Secondary | ICD-10-CM

## 2012-02-26 NOTE — Addendum Note (Signed)
Addended by: Willy Eddy on: 02/26/2012 01:21 PM   Modules accepted: Orders

## 2012-03-05 ENCOUNTER — Other Ambulatory Visit (INDEPENDENT_AMBULATORY_CARE_PROVIDER_SITE_OTHER): Payer: Managed Care, Other (non HMO)

## 2012-03-05 DIAGNOSIS — Z0000 Encounter for general adult medical examination without abnormal findings: Secondary | ICD-10-CM

## 2012-03-05 LAB — POCT URINALYSIS DIPSTICK
Leukocytes, UA: NEGATIVE
Nitrite, UA: NEGATIVE
pH, UA: 5.5

## 2012-03-05 LAB — CBC WITH DIFFERENTIAL/PLATELET
Basophils Relative: 0.6 % (ref 0.0–3.0)
Eosinophils Relative: 2.8 % (ref 0.0–5.0)
HCT: 45.8 % (ref 39.0–52.0)
Hemoglobin: 14.9 g/dL (ref 13.0–17.0)
Lymphs Abs: 1.4 10*3/uL (ref 0.7–4.0)
MCV: 83.2 fl (ref 78.0–100.0)
Monocytes Absolute: 0.5 10*3/uL (ref 0.1–1.0)
Monocytes Relative: 6.9 % (ref 3.0–12.0)
Neutro Abs: 5.6 10*3/uL (ref 1.4–7.7)
Platelets: 228 10*3/uL (ref 150.0–400.0)
WBC: 7.7 10*3/uL (ref 4.5–10.5)

## 2012-03-05 LAB — BASIC METABOLIC PANEL
BUN: 18 mg/dL (ref 6–23)
CO2: 27 mEq/L (ref 19–32)
Calcium: 9.3 mg/dL (ref 8.4–10.5)
Creatinine, Ser: 1 mg/dL (ref 0.4–1.5)
GFR: 85.16 mL/min (ref >60.00)
Glucose, Bld: 89 mg/dL (ref 70–99)

## 2012-03-05 LAB — LIPID PANEL
HDL: 41.4 mg/dL (ref >39.00)
Total CHOL/HDL Ratio: 5

## 2012-03-05 LAB — HEPATIC FUNCTION PANEL
AST: 19 U/L (ref 0–37)
Albumin: 3.8 g/dL (ref 3.5–5.2)
Alkaline Phosphatase: 43 U/L (ref 39–117)
Total Bilirubin: 0.7 mg/dL (ref 0.3–1.2)

## 2012-03-05 LAB — TSH: TSH: 1.07 u[IU]/mL (ref 0.35–5.50)

## 2012-03-12 ENCOUNTER — Encounter: Payer: Self-pay | Admitting: Internal Medicine

## 2012-03-12 ENCOUNTER — Ambulatory Visit (INDEPENDENT_AMBULATORY_CARE_PROVIDER_SITE_OTHER): Payer: Managed Care, Other (non HMO) | Admitting: Internal Medicine

## 2012-03-12 ENCOUNTER — Other Ambulatory Visit: Payer: Self-pay | Admitting: Internal Medicine

## 2012-03-12 ENCOUNTER — Other Ambulatory Visit: Payer: Self-pay | Admitting: *Deleted

## 2012-03-12 VITALS — BP 136/80 | HR 72 | Temp 98.2°F | Resp 16 | Ht 74.0 in | Wt 236.0 lb

## 2012-03-12 DIAGNOSIS — Z8546 Personal history of malignant neoplasm of prostate: Secondary | ICD-10-CM

## 2012-03-12 DIAGNOSIS — Z0000 Encounter for general adult medical examination without abnormal findings: Secondary | ICD-10-CM

## 2012-03-12 DIAGNOSIS — G4730 Sleep apnea, unspecified: Secondary | ICD-10-CM

## 2012-03-12 NOTE — Patient Instructions (Addendum)
Back Exercises Back exercises help treat and prevent back injuries. The goal of back exercises is to increase the strength of your abdominal and back muscles and the flexibility of your back. These exercises should be started when you no longer have back pain. Back exercises include:  Pelvic Tilt. Lie on your back with your knees bent. Tilt your pelvis until the lower part of your back is against the floor. Hold this position 5 to 10 sec and repeat 5 to 10 times.  Knee to Chest. Pull first 1 knee up against your chest and hold for 20 to 30 seconds, repeat this with the other knee, and then both knees. This may be done with the other leg straight or bent, whichever feels better.  Sit-Ups or Curl-Ups. Bend your knees 90 degrees. Start with tilting your pelvis, and do a partial, slow sit-up, lifting your trunk only 30 to 45 degrees off the floor. Take at least 2 to 3 seconds for each sit-up. Do not do sit-ups with your knees out straight. If partial sit-ups are difficult, simply do the above but with only tightening your abdominal muscles and holding it as directed.  Hip-Lift. Lie on your back with your knees flexed 90 degrees. Push down with your feet and shoulders as you raise your hips a couple inches off the floor; hold for 10 seconds, repeat 5 to 10 times.  Back arches. Lie on your stomach, propping yourself up on bent elbows. Slowly press on your hands, causing an arch in your low back. Repeat 3 to 5 times. Any initial stiffness and discomfort should lessen with repetition over time.  Shoulder-Lifts. Lie face down with arms beside your body. Keep hips and torso pressed to floor as you slowly lift your head and shoulders off the floor. Do not overdo your exercises, especially in the beginning. Exercises may cause you some mild back discomfort which lasts for a few minutes; however, if the pain is more severe, or lasts for more than 15 minutes, do not continue exercises until you see your caregiver.  Improvement with exercise therapy for back problems is slow.  See your caregivers for assistance with developing a proper back exercise program. Document Released: 05/22/2004 Document Revised: 07/07/2011 Document Reviewed: 02/13/2011 Exit[employer] Patient Information 2013 [employer].  

## 2012-03-12 NOTE — Progress Notes (Signed)
Subjective:    Patient ID: [name], male    DOB: 10-Jul-1950, 61 y.o.   MRN: [id number]  HPI CPX Facial flushing noted on niacin Fish oil  And diet Insomnia issues    Review of Systems  Constitutional: Negative for fever and fatigue.  HENT: Negative for hearing loss, congestion, neck pain and postnasal drip.   Eyes: Negative for discharge, redness and visual disturbance.  Respiratory: Negative for cough, shortness of breath and wheezing.   Cardiovascular: Positive for leg swelling.  Gastrointestinal: Negative for abdominal pain, constipation and abdominal distention.  Genitourinary: Negative for urgency and frequency.  Musculoskeletal: Positive for back pain and joint swelling. Negative for arthralgias.  Skin: Negative for color change and rash.  Neurological: Negative for weakness and light-headedness.  Hematological: Negative for adenopathy.  Psychiatric/Behavioral: Negative for behavioral problems.   Past Medical History  Diagnosis Date  . Obesity   . Hyperlipidemia   . Allergy   . Diverticulosis   . Hemorrhoids   . Hx of adenomatous colonic polyps   . DVT (deep venous thrombosis)   . 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 Rhinitis   . Prostate cancer     History   Social History  . Marital Status: Married    Spouse Name: N/A    Number of Children: N/A  . Years of Education: N/A   Occupational History  . retired    Social History Main Topics  . Smoking status: Never Smoker   . Smokeless tobacco: Not on file  . Alcohol Use: No  . Drug Use: No  . Sexually Active: Not on file   Other Topics Concern  . Not on file   Social History Narrative  . No narrative on file    Past Surgical History  Procedure Date  . Eye surgery   . Fx l tibia,fibula with external fixator   . Remove fixator   . Varicocele repair   . Bilateral vein ligation   . Prostatectomy     Family History  Problem Relation Age of Onset  .  Arthritis    . Diabetes    . Stroke    . Coronary artery disease    . Heart disease    . Colon polyps Father     Allergies  Allergen Reactions  . Adhesive (Tape)     Irritation.  . Fentanyl     REACTION: N/V  . Latex     Irritation at site.  . Levofloxacin     Light headed    Current Outpatient Prescriptions on File Prior to Visit  Medication Sig Dispense Refill  . aspirin EC 81 MG tablet Take 81 mg by mouth daily.      Marland Kitchen CALCIUM PO Take 1 tablet by mouth 2 (two) times daily.       . Cholecalciferol (VITAMIN D3) 2000 UNITS TABS Take 2,000 Units by mouth daily.      . Multiple Vitamin (MULTIVITAMIN WITH MINERALS) TABS Take 1 tablet by mouth daily.      . niacin (SLO-NIACIN) 500 MG tablet Take 500 mg by mouth 2 (two) times daily with a meal.      . Omega-3 Fatty Acids (FISH OIL CONCENTRATE) 1000 MG CAPS Take 1,000 mg by mouth 2 (two) times daily. Fish oil concentrate-super omega 3 gems 1000mg  --take 2 per day      . [DISCONTINUED] Calcium-Vitamin D (CALTRATE 600 PLUS-VIT D PO) Take by mouth 2 (two) times daily.  BP 136/80  Pulse 72  Temp 98.2 F (36.8 C)  Resp 16  Ht 6\' 2"  (1.88 m)  Wt 236 lb (107.049 kg)  BMI 30.30 kg/m2        Objective:   Physical Exam  Nursing note and vitals reviewed. Constitutional: He appears well-developed and well-nourished.  HENT:  Head: Normocephalic and atraumatic.  Eyes: Conjunctivae normal are normal. Pupils are equal, round, and reactive to light.  Neck: Normal range of motion. Neck supple.  Cardiovascular: Normal rate and regular rhythm.   Pulmonary/Chest: Effort normal and breath sounds normal.  Abdominal: Soft. Bowel sounds are normal.  Musculoskeletal: He exhibits edema and tenderness.          Assessment & Plan:   Patient presents for yearly preventative medicine examination.   all immunizations and health maintenance protocols were reviewed with the patient and they are up to date with these protocols.    screening laboratory values were reviewed with the patient including screening of hyperlipidemia PSA renal function and hepatic function.   There medications past medical history social history problem list and allergies were reviewed in detail.   Goals were established with regard to weight loss exercise diet in compliance with medications  Post prostate cancer surgery Hyperlipidemia discussed  Weight loss goals Sleep pattern disturbance... Sleep to early

## 2012-03-22 ENCOUNTER — Telehealth: Payer: Self-pay | Admitting: Internal Medicine

## 2012-03-22 NOTE — Telephone Encounter (Signed)
Pt p/u home sleep study device on Friday 03/19/12. Pt was to use the device and return on Monday. Pt called and stated that he "pushed the button" which turned the device off. Pt requested to repeat the study again tonight. Pt will bring device up here today to re-program it to use tonight. Rhonda J Cobb

## 2012-04-02 ENCOUNTER — Ambulatory Visit (INDEPENDENT_AMBULATORY_CARE_PROVIDER_SITE_OTHER): Payer: Managed Care, Other (non HMO) | Admitting: Pulmonary Disease

## 2012-04-02 DIAGNOSIS — G4733 Obstructive sleep apnea (adult) (pediatric): Secondary | ICD-10-CM

## 2012-04-02 DIAGNOSIS — G4730 Sleep apnea, unspecified: Secondary | ICD-10-CM

## 2012-04-19 ENCOUNTER — Other Ambulatory Visit: Payer: Managed Care, Other (non HMO)

## 2012-04-26 ENCOUNTER — Encounter: Payer: Managed Care, Other (non HMO) | Admitting: Internal Medicine

## 2012-05-09 ENCOUNTER — Encounter: Payer: Self-pay | Admitting: Internal Medicine

## 2012-06-30 ENCOUNTER — Other Ambulatory Visit: Payer: Managed Care, Other (non HMO)

## 2012-07-09 ENCOUNTER — Encounter: Payer: Self-pay | Admitting: Internal Medicine

## 2012-07-09 ENCOUNTER — Ambulatory Visit (INDEPENDENT_AMBULATORY_CARE_PROVIDER_SITE_OTHER): Payer: BC Managed Care – PPO | Admitting: Internal Medicine

## 2012-07-09 VITALS — BP 164/94 | HR 76 | Temp 98.2°F | Wt 251.0 lb

## 2012-07-09 DIAGNOSIS — Z8546 Personal history of malignant neoplasm of prostate: Secondary | ICD-10-CM

## 2012-07-09 DIAGNOSIS — I10 Essential (primary) hypertension: Secondary | ICD-10-CM

## 2012-07-09 LAB — PSA: PSA: 0 ng/mL — ABNORMAL LOW (ref 0.10–4.00)

## 2012-07-09 MED ORDER — LISINOPRIL 10 MG PO TABS: 10.0000 mg | ORAL_TABLET | Freq: Every day | ORAL | Status: DC

## 2012-07-09 NOTE — Patient Instructions (Addendum)
Weight loss needed

## 2012-07-09 NOTE — Progress Notes (Signed)
Subjective:    Patient ID: [name], male    DOB: Jul 31, 1950, 62 y.o.   MRN: [id number]  HPI    Review of Systems  Constitutional: Negative for fever and fatigue.  HENT: Negative for hearing loss, congestion, neck pain and postnasal drip.   Eyes: Negative for discharge, redness and visual disturbance.  Respiratory: Negative for cough, shortness of breath and wheezing.   Cardiovascular: Negative for leg swelling.  Gastrointestinal: Negative for abdominal pain, constipation and abdominal distention.  Genitourinary: Negative for urgency and frequency.  Musculoskeletal: Negative for joint swelling and arthralgias.  Skin: Negative for color change and rash.  Neurological: Negative for weakness and light-headedness.  Hematological: Negative for adenopathy.  Psychiatric/Behavioral: Negative for behavioral problems.   Past Medical History  Diagnosis Date  . Obesity   . Hyperlipidemia   . Allergy   . Diverticulosis   . Hemorrhoids   . Hx of adenomatous colonic polyps   . DVT (deep venous thrombosis)   . 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 Rhinitis   . Prostate cancer     History   Social History  . Marital Status: Married    Spouse Name: N/A    Number of Children: N/A  . Years of Education: N/A   Occupational History  . retired    Social History Main Topics  . Smoking status: Never Smoker   . Smokeless tobacco: Not on file  . Alcohol Use: No  . Drug Use: No  . Sexually Active: Not on file   Other Topics Concern  . Not on file   Social History Narrative  . No narrative on file    Past Surgical History  Procedure Laterality Date  . Eye surgery    . Fx l tibia,fibula with external fixator    . Remove fixator    . Varicocele repair    . Bilateral vein ligation    . Prostatectomy      Family History  Problem Relation Age of Onset  . Arthritis    . Diabetes    . Stroke    . Coronary artery disease    . Heart disease     . Colon polyps Father     Allergies  Allergen Reactions  . Adhesive (Tape)     Irritation.  . Fentanyl     REACTION: N/V  . Latex     Irritation at site.  . Levofloxacin     Light headed    Current Outpatient Prescriptions on File Prior to Visit  Medication Sig Dispense Refill  . CALCIUM PO Take 1 tablet by mouth 2 (two) times daily.       . Cholecalciferol (VITAMIN D3) 2000 UNITS TABS Take 2,000 Units by mouth daily.      . Multiple Vitamin (MULTIVITAMIN WITH MINERALS) TABS Take 1 tablet by mouth daily.      . niacin (SLO-NIACIN) 500 MG tablet Take 500 mg by mouth 2 (two) times daily with a meal.      . Omega-3 Fatty Acids (FISH OIL CONCENTRATE) 1000 MG CAPS Take 1,000 mg by mouth 2 (two) times daily. Fish oil concentrate-super omega 3 gems 1000mg  --take 2 per day      . [DISCONTINUED] Calcium-Vitamin D (CALTRATE 600 PLUS-VIT D PO) Take by mouth 2 (two) times daily.        No current facility-administered medications on file prior to visit.    BP 164/94  Pulse 76  Temp(Src) 98.2 F (36.8 C) (  Oral)  Wt 251 lb (113.853 kg)  BMI 32.21 kg/m2       Objective:   Physical Exam  Nursing note and vitals reviewed. Constitutional: He appears well-developed and well-nourished.  HENT:  Head: Normocephalic and atraumatic.  Eyes: Conjunctivae are normal. Pupils are equal, round, and reactive to light.  Neck: Normal range of motion. Neck supple.  Cardiovascular: Normal rate and regular rhythm.   Pulmonary/Chest: Effort normal and breath sounds normal.  Abdominal: Soft. Bowel sounds are normal.          Assessment & Plan:  HTN and follow up Weight reduction stratigies  Monitoring of blod pressure in 3 months PSA check today

## 2012-07-16 ENCOUNTER — Telehealth: Payer: Self-pay | Admitting: Internal Medicine

## 2012-07-16 NOTE — Telephone Encounter (Signed)
Patient Information:  Caller Name: Taryll  Phone: [phone]  Patient: Tyric, Rodeheaver  Gender: Male  DOB: 18-Sep-1950  Age: 62 Years  PCP: Darryll Capers (Adults only)  Office Follow Up:  Does the office need to follow up with this patient?: No  Instructions For The Office: N/A  RN Note:  started on Lisinopril 07-09-12 and 07-14-12 he started having periods of fatigue.  He said today is the most significant day.  He has no chest pain, shortness of breath, nausea or vomiting   He said he is able to function but is just having periods of fatigue.  He has not been checking his blood pressure yet. advised him to continue his medicine and check his pressure a few times a day to see how it is doing.  to call office next week for appt.  To call back for any worsening of sxs.  Used protocols for fatigue and it addressed sxs started after starting a new medication  Symptoms  Reason For Call & Symptoms: fatigue  Reviewed Health History In EMR: Yes  Reviewed Medications In EMR: Yes  Reviewed Allergies In EMR: Yes  Reviewed Surgeries / Procedures: Yes  Date of Onset of Symptoms: 07/14/2012  Guideline(s) Used:  No Protocol Available - Sick Adult  Disposition Per Guideline:   See Within 3 Days in Office  Reason For Disposition Reached:   Nursing judgment  Advice Given:  N/A  RN Overrode Recommendation:  Follow Up With Office Later  will call next wk for appt

## 2012-09-11 ENCOUNTER — Ambulatory Visit (INDEPENDENT_AMBULATORY_CARE_PROVIDER_SITE_OTHER): Payer: BC Managed Care – PPO | Admitting: Emergency Medicine

## 2012-09-11 ENCOUNTER — Ambulatory Visit: Payer: BC Managed Care – PPO

## 2012-09-11 VITALS — BP 164/98 | HR 68 | Temp 98.2°F | Resp 18 | Ht 72.0 in | Wt 257.8 lb

## 2012-09-11 DIAGNOSIS — R0789 Other chest pain: Secondary | ICD-10-CM

## 2012-09-11 DIAGNOSIS — I10 Essential (primary) hypertension: Secondary | ICD-10-CM | POA: Insufficient documentation

## 2012-09-11 DIAGNOSIS — R071 Chest pain on breathing: Secondary | ICD-10-CM

## 2012-09-11 MED ORDER — HYDROCODONE-ACETAMINOPHEN 5-325 MG PO TABS: 1.0000 | ORAL_TABLET | ORAL | Status: DC | PRN

## 2012-09-11 NOTE — Patient Instructions (Addendum)
Chest Contusion You have been checked for injuries to your chest. Your caregiver has not found injuries serious enough to require hospitalization. It is common to have bruises and sore muscles after an injury. These tend to feel worse the first 24 hours. You may gradually develop more stiffness and soreness over the next several hours to several days. This usually feels worse the first morning following your injury. After a few days, you will usually begin to improve. The amount of improvement depends on the amount of damage. Following the accident, if the pain in any area continues to increase or you develop new areas of pain, you should see your primary caregiver or return to the Emergency Department for re-evaluation. HOME CARE INSTRUCTIONS   Put ice on sore areas every 2 hours for 20 minutes while awake for the next 2 days.  Drink extra fluids. Do not drink alcohol.  Activity as tolerated. Lifting may make pain worse.  Only take over-the-counter or prescription medicines for pain, discomfort, or fever as directed by your caregiver. Do not use aspirin. This may increase bruising or increase bleeding. SEEK IMMEDIATE MEDICAL CARE IF:   There is a worsening of any of the problems that brought you in for care.  Shortness of breath, dizziness or fainting develop.  You have chest pain, difficulty breathing, or develop pain going down the left arm or up into jaw.  You feel sick to your stomach (nausea), vomiting or sweats.  You have increasing belly (abdominal) discomfort.  There is blood in your urine, stool, or if you vomit blood.  There is pain in either shoulder in an area where a shoulder strap would be.  You have feelings of lightheadedness, or if you should have a fainting episode.  You have numbness, tingling, weakness, or problems with the use of your arms or legs.  Severe headaches not relieved with medications develop.  You have a change in bowel or bladder control.  There  is increasing pain in any areas of the body. If you feel your symptoms are worsening, and you are not able to see your primary caregiver, return to the Emergency Department immediately. MAKE SURE YOU:   Understand these instructions.  Will watch your condition.  Will get help right away if you are not doing well or get worse. Document Released: 01/07/2001 Document Revised: 07/07/2011 Document Reviewed: 12/01/2007 [employer] Patient Information 2013 [employer].  

## 2012-09-11 NOTE — Progress Notes (Signed)
[facility] [address], [place] [address] [phone]  Date:  09/11/2012   Name:  [name]   DOB:  04-22-1951   MRN:  [id number]  PCP:  Carrie Mew, MD    Chief Complaint: Chest Pain   History of Present Illness:  [name] is a 62 y.o. very pleasant male patient who presents with the following:  Dumpster diving in his residential trash can and injured his right chest wall reaching into the can.  Has sharp pleuritic pain in his right chest wall.  No shortness of breath or hemoptysis. No bruising.  No cough.  No improvement with over the counter medications or other home remedies. Denies other complaint or health concern today.   Patient Active Problem List   Diagnosis Date Noted  . Prostate cancer 06/18/2011  . ANKLE PAIN, CHRONIC 05/01/2009  . ELEVATED BP READING WITHOUT DX HYPERTENSION 05/01/2009  . BLOOD IN STOOL 10/11/2008  . OSTEOPENIA 08/15/2008  . VARICOSE VEINS LOWER EXTREMITIES W/INFLAMMATION 11/01/2007  . HYPERLIPIDEMIA 12/30/2006  . DIVERTICULOSIS, COLON 12/30/2006  . COLONIC POLYPS, HX OF 12/30/2006    Past Medical History  Diagnosis Date  . Obesity   . Hyperlipidemia   . Allergy   . Diverticulosis   . Hemorrhoids   . Hx of adenomatous colonic polyps   . DVT (deep venous thrombosis)   . 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 Rhinitis   . Prostate cancer   . Hypertension     Past Surgical History  Procedure Laterality Date  . Eye surgery    . Fx l tibia,fibula with external fixator    . Remove fixator    . Varicocele repair    . Bilateral vein ligation    . Prostatectomy    . Fracture surgery      History  Substance Use Topics  . Smoking status: Never Smoker   . Smokeless tobacco: Not on file  . Alcohol Use: No    Family History  Problem Relation Age of Onset  . Arthritis    . Diabetes    . Stroke    . Coronary artery disease    . Heart disease    . Colon  polyps Father   . Stroke Father   . Cancer Maternal Grandmother   . Stroke Maternal Grandfather   . Cancer Paternal Grandmother   . Cancer Paternal Grandfather     Allergies  Allergen Reactions  . Adhesive (Tape)     Irritation.  . Fentanyl     REACTION: N/V  . Latex     Irritation at site.  . Levofloxacin     Light headed    Medication list has been reviewed and updated.  Current Outpatient Prescriptions on File Prior to Visit  Medication Sig Dispense Refill  . aspirin 325 MG tablet Take 325 mg by mouth daily.      Marland Kitchen CALCIUM PO Take 1 tablet by mouth 2 (two) times daily.       . Cholecalciferol (VITAMIN D3) 2000 UNITS TABS Take 2,000 Units by mouth daily.      Marland Kitchen lisinopril (PRINIVIL,ZESTRIL) 10 MG tablet Take 1 tablet (10 mg total) by mouth daily.  90 tablet  3  . niacin (SLO-NIACIN) 500 MG tablet Take 500 mg by mouth 2 (two) times daily with a meal.      . Omega-3 Fatty Acids (FISH OIL CONCENTRATE) 1000 MG CAPS Take 1,000 mg by mouth 2 (  two) times daily. Fish oil concentrate-super omega 3 gems 1000mg  --take 2 per day      . [DISCONTINUED] Calcium-Vitamin D (CALTRATE 600 PLUS-VIT D PO) Take by mouth 2 (two) times daily.        No current facility-administered medications on file prior to visit.    Review of Systems:  As per HPI, otherwise negative.    Physical Examination: Filed Vitals:   09/11/12 1201  BP: 164/98  Pulse: 68  Temp: 98.2 F (36.8 C)  Resp: 18   Filed Vitals:   09/11/12 1201  Height: 6' (1.829 m)  Weight: 257 lb 12.8 oz (116.937 kg)   Body mass index is 34.96 kg/(m^2). Ideal Body Weight: Weight in (lb) to have BMI = 25: 183.9  GEN: WDWN, NAD, Non-toxic, A & O x 3 HEENT: Atraumatic, Normocephalic. Neck supple. No masses, No LAD. Ears and Nose: No external deformity. CV: RRR, No M/G/R. No JVD. No thrill. No extra heart sounds. PULM: CTA B, no wheezes, crackles, rhonchi. No retractions. No resp. distress. No accessory muscle use. CHEST:   Tender right inferolateral chest wall.  No ecchymosis or flail.  No crepitus ABD: S, NT, ND, +BS. No rebound. No HSM. EXTR: No c/c/e NEURO Normal gait.  PSYCH: Normally interactive. Conversant. Not depressed or anxious appearing.  Calm demeanor.    Assessment and Plan: Chest wall contusion vicodin    Signed,  Phillips Odor, MD  [facility] reading (PRIMARY) by  Dr. Dareen Piano.  Negative chest.

## 2012-10-18 ENCOUNTER — Encounter: Payer: Self-pay | Admitting: Internal Medicine

## 2012-10-18 ENCOUNTER — Ambulatory Visit (INDEPENDENT_AMBULATORY_CARE_PROVIDER_SITE_OTHER): Payer: BC Managed Care – PPO | Admitting: Internal Medicine

## 2012-10-18 VITALS — BP 140/80 | HR 80 | Temp 98.2°F | Resp 16 | Ht 72.0 in | Wt 256.0 lb

## 2012-10-18 DIAGNOSIS — I10 Essential (primary) hypertension: Secondary | ICD-10-CM

## 2012-10-18 DIAGNOSIS — E785 Hyperlipidemia, unspecified: Secondary | ICD-10-CM

## 2012-10-18 MED ORDER — LISINOPRIL-HYDROCHLOROTHIAZIDE 20-25 MG PO TABS: 1.0000 | ORAL_TABLET | Freq: Every day | ORAL | Status: DC

## 2012-10-18 NOTE — Progress Notes (Signed)
Subjective:    Patient ID: [name], male    DOB: January 24, 1951, 62 y.o.   MRN: [id number]  HPI HTN  fell fatigued with the start of blood pressure control  Significant blood pressure drop with medications but has some diastolic dysfunction Right abdominal wall pain following trauma    Review of Systems  Constitutional: Negative for fever and fatigue.  HENT: Negative for hearing loss, congestion, neck pain and postnasal drip.   Eyes: Negative for discharge, redness and visual disturbance.  Respiratory: Negative for cough, shortness of breath and wheezing.   Cardiovascular: Negative for leg swelling.  Gastrointestinal: Negative for abdominal pain, constipation and abdominal distention.  Genitourinary: Negative for urgency and frequency.  Musculoskeletal: Negative for joint swelling and arthralgias.  Skin: Negative for color change and rash.  Neurological: Negative for weakness and light-headedness.  Hematological: Negative for adenopathy.  Psychiatric/Behavioral: Negative for behavioral problems.   Past Medical History  Diagnosis Date  . Obesity   . Hyperlipidemia   . Allergy   . Diverticulosis   . Hemorrhoids   . Hx of adenomatous colonic polyps   . DVT (deep venous thrombosis)   . 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 Rhinitis   . Prostate cancer   . Hypertension     History   Social History  . Marital Status: Married    Spouse Name: N/A    Number of Children: N/A  . Years of Education: N/A   Occupational History  . retired    Social History Main Topics  . Smoking status: Never Smoker   . Smokeless tobacco: Not on file  . Alcohol Use: No  . Drug Use: No  . Sexually Active: Not on file   Other Topics Concern  . Not on file   Social History Narrative  . No narrative on file    Past Surgical History  Procedure Laterality Date  . Eye surgery    . Fx l tibia,fibula with external fixator    . Remove fixator    .  Varicocele repair    . Bilateral vein ligation    . Prostatectomy    . Fracture surgery      Family History  Problem Relation Age of Onset  . Arthritis    . Diabetes    . Stroke    . Coronary artery disease    . Heart disease    . Colon polyps Father   . Stroke Father   . Cancer Maternal Grandmother   . Stroke Maternal Grandfather   . Cancer Paternal Grandmother   . Cancer Paternal Grandfather     Allergies  Allergen Reactions  . Adhesive (Tape)     Irritation.  . Fentanyl     REACTION: N/V  . Latex     Irritation at site.  . Levofloxacin     Light headed    Current Outpatient Prescriptions on File Prior to Visit  Medication Sig Dispense Refill  . aspirin 325 MG tablet Take 325 mg by mouth daily.      Marland Kitchen CALCIUM PO Take 1 tablet by mouth 2 (two) times daily.       . Cholecalciferol (VITAMIN D3) 2000 UNITS TABS Take 2,000 Units by mouth daily.      Marland Kitchen HYDROcodone-acetaminophen (NORCO) 5-325 MG per tablet Take 1-2 tablets by mouth every 4 (four) hours as needed for pain.  30 tablet  0  . niacin (SLO-NIACIN) 500 MG tablet Take 500 mg by mouth 2 (  two) times daily with a meal.      . Omega-3 Fatty Acids (FISH OIL CONCENTRATE) 1000 MG CAPS Take 1,000 mg by mouth 2 (two) times daily. Fish oil concentrate-super omega 3 gems 1000mg  --take 2 per day      . [DISCONTINUED] Calcium-Vitamin D (CALTRATE 600 PLUS-VIT D PO) Take by mouth 2 (two) times daily.        No current facility-administered medications on file prior to visit.    BP 140/80  Pulse 80  Temp(Src) 98.2 F (36.8 C)  Resp 16  Ht 6' (1.829 m)  Wt 256 lb (116.121 kg)  BMI 34.71 kg/m2  '     Objective:   Physical Exam  Nursing note and vitals reviewed. Constitutional: He appears well-developed and well-nourished.  HENT:  Head: Normocephalic and atraumatic.  Eyes: Conjunctivae are normal. Pupils are equal, round, and reactive to light.  Neck: Normal range of motion. Neck supple.  Cardiovascular: Normal rate  and regular rhythm.   Pulmonary/Chest: Effort normal and breath sounds normal.  Abdominal: Soft. Bowel sounds are normal.    Tender at floating ribs      Assessment & Plan:  HTN stable reviewed the labs Floating ribs Add HTCZ for diastolic controll Abdominal wall pain from trauma

## 2012-12-24 ENCOUNTER — Telehealth: Payer: Self-pay | Admitting: Internal Medicine

## 2012-12-24 ENCOUNTER — Ambulatory Visit (INDEPENDENT_AMBULATORY_CARE_PROVIDER_SITE_OTHER): Payer: BC Managed Care – PPO | Admitting: Internal Medicine

## 2012-12-24 ENCOUNTER — Encounter: Payer: Self-pay | Admitting: Internal Medicine

## 2012-12-24 VITALS — BP 132/80 | Temp 98.4°F | Wt 248.0 lb

## 2012-12-24 DIAGNOSIS — R1030 Lower abdominal pain, unspecified: Secondary | ICD-10-CM

## 2012-12-24 DIAGNOSIS — R109 Unspecified abdominal pain: Secondary | ICD-10-CM

## 2012-12-24 DIAGNOSIS — R1032 Left lower quadrant pain: Secondary | ICD-10-CM | POA: Insufficient documentation

## 2012-12-24 LAB — POCT URINALYSIS DIPSTICK
Bilirubin, UA: NEGATIVE
Ketones, UA: NEGATIVE
Leukocytes, UA: NEGATIVE
Nitrite, UA: NEGATIVE
Protein, UA: NEGATIVE
pH, UA: 5.5

## 2012-12-24 MED ORDER — TRAMADOL HCL 50 MG PO TABS: 50.0000 mg | ORAL_TABLET | Freq: Three times a day (TID) | ORAL | Status: DC | PRN

## 2012-12-24 NOTE — Progress Notes (Signed)
Subjective:    Patient ID: [name], male    DOB: September 28, 1950, 62 y.o.   MRN: [id number]  HPI  62 year old white male with history of prostate cancer status post radical prostatectomy 1 year ago complains of intermittent left hip pain. 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 He was crawling around on his knees  in awkward positions. Soon after, he experienced left knee discomfort that lasted for 2 or 3 days. Then he reports experiencing intermittent left groin discomfort. It has "pinching" quality. No associated urinary complaints. Patient was able to exercise on his elliptical machine without any difficulty. His discomfort usually worse in the evening. His symptoms seem to be worse with certain positions (pivoting or lifting). He denies any low back pain but has had issues with left upper thigh numbness in the past.  Review of Systems Negative for fever or chills,  No urinary complaints    Past Medical History  Diagnosis Date  . Obesity   . Hyperlipidemia   . Allergy   . Diverticulosis   . Hemorrhoids   . Hx of adenomatous colonic polyps   . DVT (deep venous thrombosis)   . 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 Rhinitis   . Prostate cancer   . Hypertension     History   Social History  . Marital Status: Married    Spouse Name: N/A    Number of Children: N/A  . Years of Education: N/A   Occupational History  . retired    Social History Main Topics  . Smoking status: Never Smoker   . Smokeless tobacco: Not on file  . Alcohol Use: No  . Drug Use: No  . Sexual Activity: Not on file   Other Topics Concern  . Not on file   Social History Narrative  . No narrative on file    Past Surgical History  Procedure Laterality Date  . Eye surgery    . Fx l tibia,fibula with external fixator    . Remove fixator    . Varicocele repair    . Bilateral vein ligation    . Prostatectomy    . Fracture  surgery      Family History  Problem Relation Age of Onset  . Arthritis    . Diabetes    . Stroke    . Coronary artery disease    . Heart disease    . Colon polyps Father   . Stroke Father   . Cancer Maternal Grandmother   . Stroke Maternal Grandfather   . Cancer Paternal Grandmother   . Cancer Paternal Grandfather     Allergies  Allergen Reactions  . Adhesive [Tape]     Irritation.  . Fentanyl     REACTION: N/V  . Latex     Irritation at site.  . Levofloxacin     Light headed    Current Outpatient Prescriptions on File Prior to Visit  Medication Sig Dispense Refill  . aspirin 325 MG tablet Take 325 mg by mouth daily.      Marland Kitchen CALCIUM PO Take 1 tablet by mouth 2 (two) times daily.       . Cholecalciferol (VITAMIN D3) 2000 UNITS TABS Take 2,000 Units by mouth daily.      Marland Kitchen lisinopril-hydrochlorothiazide (PRINZIDE,ZESTORETIC) 20-25 MG per tablet Take 1 tablet by mouth daily.  90 tablet  3  . niacin (SLO-NIACIN) 500 MG tablet Take 500 mg by mouth  2 (two) times daily with a meal.      . Omega-3 Fatty Acids (FISH OIL CONCENTRATE) 1000 MG CAPS Take 1,000 mg by mouth 2 (two) times daily. Fish oil concentrate-super omega 3 gems 1000mg  --take 2 per day      . [DISCONTINUED] Calcium-Vitamin D (CALTRATE 600 PLUS-VIT D PO) Take by mouth 2 (two) times daily.        No current facility-administered medications on file prior to visit.    BP 132/80  Temp(Src) 98.4 F (36.9 C) (Oral)  Wt 248 lb (112.492 kg)  BMI 33.63 kg/m2    Objective:   Physical Exam  Constitutional: He is oriented to person, place, and time. He appears well-developed and well-nourished.  HENT:  Head: Normocephalic and atraumatic.  Neck: Neck supple.  Cardiovascular: Normal rate, regular rhythm and normal heart sounds.   Pulmonary/Chest: Effort normal and breath sounds normal. He has no wheezes.  Abdominal: Soft. Bowel sounds are normal. He exhibits no mass. There is no tenderness.  No flank tenderness to  percussion Suprapubic and left inguinal scar well healed. No obvious hernia. Normal testicular exam  Musculoskeletal:  Trace lower ext edema bilaterally No pain with internal or external rotation of left hip,  No pain with left hip extension or flexion. Patient able to squat, toe and heel walk without difficulty  Lymphadenopathy:    He has no cervical adenopathy.  Neurological: He is alert and oriented to person, place, and time. No cranial nerve deficit.  Skin: Skin is warm and dry. No rash noted.          Assessment & Plan:

## 2012-12-24 NOTE — Telephone Encounter (Signed)
Patient Information:  Caller Name: Kenlee  Phone: [phone]  Patient: [name], [name]  Gender: Male  DOB: 26-Nov-1950  Age: 62 Years  PCP: Darryll Capers (Adults only)  Office Follow Up:  Does the office need to follow up with this patient?: No  Instructions For The Office: N/A  RN Note:  Blood clot in one leg, he is unsure in 2009.  Symptoms  Reason For Call & Symptoms: Patient calling with left hip pain.  Had pain initially with his left knee 2 weeks ago.  The following morning the knee pain had worsened and he used a crutch to ambulate.  He continue to walk, exercise and the knee pain improved.  On 8/26 he started getting pain in the left hip intermittently.  Reviewed Health History In EMR: Yes  Reviewed Medications In EMR: Yes  Reviewed Allergies In EMR: Yes  Reviewed Surgeries / Procedures: Yes  Date of Onset of Symptoms: 12/21/2012  Guideline(s) Used:  Leg Pain  Disposition Per Guideline:   See Today or Tomorrow in Office  Reason For Disposition Reached:   Patient wants to be seen  Advice Given:  N/A  Patient Will Follow Care Advice:  YES  Appointment Scheduled:  12/24/2012 13:15:00 Appointment Scheduled Provider:  Artist Pais, Doe-Hyun Molly Maduro) (Adults only)

## 2012-12-24 NOTE — Assessment & Plan Note (Signed)
Patient had experiencing intermittent left groin pain. His physical exam is unremarkable. Unclear whether he strained his hip flexor versus he may have adhesions from previous radical prostatectomy. His urinalysis was unremarkable. Patient advised to decrease physical activity and lifting. Use tramadol as needed. Followup with his PCP if persistent or worsening symptoms.

## 2012-12-24 NOTE — Patient Instructions (Addendum)
Follow up with Dr. Lovell Sheehan when you return from your trip. Please contact our office if your symptoms do not improve or gets worse.

## 2013-03-01 ENCOUNTER — Other Ambulatory Visit (INDEPENDENT_AMBULATORY_CARE_PROVIDER_SITE_OTHER): Payer: BC Managed Care – PPO

## 2013-03-01 DIAGNOSIS — E785 Hyperlipidemia, unspecified: Secondary | ICD-10-CM

## 2013-03-01 DIAGNOSIS — Z0000 Encounter for general adult medical examination without abnormal findings: Secondary | ICD-10-CM

## 2013-03-01 LAB — CBC WITH DIFFERENTIAL/PLATELET
Basophils Relative: 0.6 % (ref 0.0–3.0)
Eosinophils Relative: 4.4 % (ref 0.0–5.0)
Hemoglobin: 15.9 g/dL (ref 13.0–17.0)
Lymphocytes Relative: 24 % (ref 12.0–46.0)
Monocytes Relative: 8.1 % (ref 3.0–12.0)
Neutro Abs: 4.9 10*3/uL (ref 1.4–7.7)
Neutrophils Relative %: 62.9 % (ref 43.0–77.0)
RBC: 5.52 Mil/uL (ref 4.22–5.81)
WBC: 7.7 10*3/uL (ref 4.5–10.5)

## 2013-03-01 LAB — LIPID PANEL
Cholesterol: 242 mg/dL — ABNORMAL HIGH (ref 0–200)
HDL: 43 mg/dL (ref >39.00)
Total CHOL/HDL Ratio: 6
VLDL: 33.8 mg/dL (ref 0.0–40.0)

## 2013-03-01 LAB — HEPATIC FUNCTION PANEL
ALT: 19 U/L (ref 0–53)
AST: 18 U/L (ref 0–37)
Alkaline Phosphatase: 46 U/L (ref 39–117)
Bilirubin, Direct: 0.1 mg/dL (ref 0.0–0.3)
Total Protein: 6.9 g/dL (ref 6.0–8.3)

## 2013-03-01 LAB — BASIC METABOLIC PANEL
Calcium: 9.4 mg/dL (ref 8.4–10.5)
Creatinine, Ser: 0.9 mg/dL (ref 0.4–1.5)
Sodium: 139 mEq/L (ref 135–145)

## 2013-03-01 LAB — POCT URINALYSIS DIPSTICK
Bilirubin, UA: NEGATIVE
Blood, UA: NEGATIVE
Ketones, UA: NEGATIVE
Spec Grav, UA: 1.025
Urobilinogen, UA: 0.2
pH, UA: 5.5

## 2013-03-01 LAB — LDL CHOLESTEROL, DIRECT: Direct LDL: 169 mg/dL

## 2013-03-01 LAB — PSA: PSA: 0 ng/mL — ABNORMAL LOW (ref 0.10–4.00)

## 2013-03-03 ENCOUNTER — Other Ambulatory Visit: Payer: Self-pay

## 2013-03-07 ENCOUNTER — Other Ambulatory Visit: Payer: BC Managed Care – PPO

## 2013-03-08 ENCOUNTER — Other Ambulatory Visit: Payer: BC Managed Care – PPO

## 2013-03-11 ENCOUNTER — Encounter: Payer: Self-pay | Admitting: *Deleted

## 2013-03-14 ENCOUNTER — Ambulatory Visit (INDEPENDENT_AMBULATORY_CARE_PROVIDER_SITE_OTHER): Payer: BC Managed Care – PPO | Admitting: Internal Medicine

## 2013-03-14 ENCOUNTER — Encounter: Payer: Self-pay | Admitting: Internal Medicine

## 2013-03-14 ENCOUNTER — Telehealth: Payer: Self-pay | Admitting: Internal Medicine

## 2013-03-14 VITALS — BP 134/80 | HR 72 | Temp 98.2°F | Resp 16 | Ht 72.0 in | Wt 246.0 lb

## 2013-03-14 DIAGNOSIS — K4090 Unilateral inguinal hernia, without obstruction or gangrene, not specified as recurrent: Secondary | ICD-10-CM

## 2013-03-14 DIAGNOSIS — J45909 Unspecified asthma, uncomplicated: Secondary | ICD-10-CM

## 2013-03-14 DIAGNOSIS — Z23 Encounter for immunization: Secondary | ICD-10-CM

## 2013-03-14 DIAGNOSIS — J4520 Mild intermittent asthma, uncomplicated: Secondary | ICD-10-CM

## 2013-03-14 DIAGNOSIS — Z0000 Encounter for general adult medical examination without abnormal findings: Secondary | ICD-10-CM

## 2013-03-14 MED ORDER — METHYLPREDNISOLONE ACETATE 80 MG/ML IJ SUSP
80.0000 mg | Freq: Once | INTRAMUSCULAR | Status: AC
  Administered 2013-03-14: 80 mg via INTRAMUSCULAR

## 2013-03-14 NOTE — Telephone Encounter (Signed)
Pt wants a 4 month ROV instead of the next opening April 8.  Please advise where I can work pt in.

## 2013-03-14 NOTE — Telephone Encounter (Signed)
He may have one of the same days on march 23

## 2013-03-14 NOTE — Addendum Note (Signed)
Addended by: Stacie Glaze on: 03/14/2013 10:36 AM   Modules accepted: Orders

## 2013-03-14 NOTE — Patient Instructions (Signed)
The patient is instructed to continue all medications as prescribed. Schedule followup with check out clerk upon leaving the clinic  

## 2013-03-14 NOTE — Addendum Note (Signed)
Addended by: Willy Eddy on: 03/14/2013 05:56 PM   Modules accepted: Orders

## 2013-03-14 NOTE — Progress Notes (Signed)
Subjective:    Patient ID: [name], male    DOB: 1950-08-19, 62 y.o.   MRN: [id number]  HPI Patient presents for annual examination.  He has a chronic cough and is on a ACE inhibitor but this cough started after exposure to a dusty house he was [place] and has improved since he stopped that activity.  We will give him a Depo-Medrol shot today to see if this is allergy versus lisinopril cough.  He also has a mass in his left groin that has produced asymmetry in the groin region   Review of Systems  Constitutional: Negative for fever and fatigue.  HENT: Positive for rhinorrhea and sinus pressure. Negative for congestion, hearing loss and postnasal drip.   Eyes: Negative for discharge, redness and visual disturbance.  Respiratory: Positive for cough, shortness of breath and wheezing.   Cardiovascular: Negative for leg swelling.  Gastrointestinal: Positive for abdominal pain. Negative for constipation and abdominal distention.  Genitourinary: Negative for urgency and frequency.       Left inguinal hernia  Musculoskeletal: Negative for arthralgias, joint swelling and neck pain.  Skin: Negative for color change and rash.  Neurological: Negative for weakness and light-headedness.  Hematological: Negative for adenopathy.  Psychiatric/Behavioral: Negative for behavioral problems.   Past Medical History  Diagnosis Date  . Obesity   . Hyperlipidemia   . Allergy   . Diverticulosis   . Hemorrhoids   . Hx of adenomatous colonic polyps   . DVT (deep venous thrombosis)   . 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 Rhinitis   . Prostate cancer   . Hypertension     History   Social History  . Marital Status: Married    Spouse Name: N/A    Number of Children: N/A  . Years of Education: N/A   Occupational History  . retired    Social History Main Topics  . Smoking status: Never Smoker   . Smokeless tobacco: Not on file  . Alcohol Use: No  .  Drug Use: No  . Sexual Activity: Not on file   Other Topics Concern  . Not on file   Social History Narrative  . No narrative on file    Past Surgical History  Procedure Laterality Date  . Eye surgery    . Fx l tibia,fibula with external fixator    . Remove fixator    . Varicocele repair    . Bilateral vein ligation    . Prostatectomy  2013    Open radical prostatecomy  . Fracture surgery      Family History  Problem Relation Age of Onset  . Arthritis    . Diabetes    . Stroke    . Coronary artery disease    . Heart disease    . Colon polyps Father   . Stroke Father   . Cancer Maternal Grandmother   . Stroke Maternal Grandfather   . Cancer Paternal Grandmother   . Cancer Paternal Grandfather     Allergies  Allergen Reactions  . Adhesive [Tape]     Irritation.  . Fentanyl     REACTION: N/V  . Latex     Irritation at site.  . Levofloxacin     Light headed    Current Outpatient Prescriptions on File Prior to Visit  Medication Sig Dispense Refill  . aspirin 325 MG tablet Take 325 mg by mouth daily.      Marland Kitchen CALCIUM PO Take 1 tablet by  mouth 2 (two) times daily.       . Cholecalciferol (VITAMIN D3) 2000 UNITS TABS Take 2,000 Units by mouth daily.      . niacin (SLO-NIACIN) 500 MG tablet Take 500 mg by mouth 2 (two) times daily with a meal.      . [DISCONTINUED] Calcium-Vitamin D (CALTRATE 600 PLUS-VIT D PO) Take by mouth 2 (two) times daily.        No current facility-administered medications on file prior to visit.    BP 134/80  Pulse 72  Temp(Src) 98.2 F (36.8 C)  Resp 16  Ht 6' (1.829 m)  Wt 246 lb (111.585 kg)  BMI 33.36 kg/m2       Objective:   Physical Exam  Nursing note and vitals reviewed. Constitutional: He appears well-developed and well-nourished.  HENT:  Head: Normocephalic and atraumatic.  Eyes: Conjunctivae are normal. Pupils are equal, round, and reactive to light.  Neck: Normal range of motion. Neck supple.  Cardiovascular:  Normal rate and regular rhythm.   Pulmonary/Chest: Effort normal. He has wheezes.  Abdominal: Soft. Bowel sounds are normal.  Genitourinary:  Left nonreducible inguinal hernia  Musculoskeletal: He exhibits edema and tenderness.          Assessment & Plan:   Patient presents for yearly preventative medicine examination.   all immunizations and health maintenance protocols were reviewed with the patient and they are up to date with these protocols.   screening laboratory values were reviewed with the patient including screening of hyperlipidemia PSA renal function and hepatic function.   There medications past medical history social history problem list and allergies were reviewed in detail.   Goals were established with regard to weight loss exercise diet in compliance with medications  Left reproducible inguinal hernia.  Allergic rhinitis with cough Depo-Medrol 80.

## 2013-03-14 NOTE — Progress Notes (Signed)
Pre visit review using our clinic review tool, if applicable. No additional management support is needed unless otherwise documented below in the visit note. 

## 2013-03-18 ENCOUNTER — Encounter: Payer: Self-pay | Admitting: Internal Medicine

## 2013-03-18 ENCOUNTER — Encounter (INDEPENDENT_AMBULATORY_CARE_PROVIDER_SITE_OTHER): Payer: Self-pay | Admitting: Surgery

## 2013-03-18 ENCOUNTER — Ambulatory Visit (INDEPENDENT_AMBULATORY_CARE_PROVIDER_SITE_OTHER): Payer: BC Managed Care – PPO | Admitting: Surgery

## 2013-03-18 VITALS — BP 146/90 | HR 68 | Temp 98.2°F | Resp 14 | Ht 73.0 in | Wt 250.4 lb

## 2013-03-18 DIAGNOSIS — K4090 Unilateral inguinal hernia, without obstruction or gangrene, not specified as recurrent: Secondary | ICD-10-CM

## 2013-03-18 DIAGNOSIS — E785 Hyperlipidemia, unspecified: Secondary | ICD-10-CM

## 2013-03-18 NOTE — Patient Instructions (Signed)

## 2013-03-18 NOTE — Progress Notes (Signed)
Patient ID: [name], male   DOB: [birth year], 62 y.o.   MRN: [id number]  Chief Complaint  Patient presents with  . New Evaluation    eval LIH    HPI [name] is a 62 y.o. male.  Pt sent at request of Dr Lovell Sheehan for [facility].  Noticed it on last physical.  Some left groin pain in past. Not causing much pain now.  Bulge is noticed by patient.  No change in bowel or bladder function. HPI  Past Medical History  Diagnosis Date  . Obesity   . Hyperlipidemia   . Allergy   . Diverticulosis   . Hemorrhoids   . Hx of adenomatous colonic polyps   . DVT (deep venous thrombosis)   . 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 Rhinitis   . Prostate cancer   . Hypertension     Past Surgical History  Procedure Laterality Date  . Eye surgery    . Fx l tibia,fibula with external fixator    . Remove fixator    . Varicocele repair    . Bilateral vein ligation    . Prostatectomy  2013    Open radical prostatecomy  . Fracture surgery      Family History  Problem Relation Age of Onset  . Arthritis    . Diabetes    . Stroke    . Coronary artery disease    . Heart disease    . Colon polyps Father   . Stroke Father   . Cancer Maternal Grandmother   . Stroke Maternal Grandfather   . Cancer Paternal Grandmother     pancreatic  . Cancer Paternal Grandfather     Social History History  Substance Use Topics  . Smoking status: Never Smoker   . Smokeless tobacco: Never Used  . Alcohol Use: Yes     Comment: occasional    Allergies  Allergen Reactions  . Adhesive [Tape]     Irritation.  . Fentanyl     REACTION: N/V  . Latex     Irritation at site.  . Levofloxacin     Light headed    Current Outpatient Prescriptions  Medication Sig Dispense Refill  . aspirin 325 MG tablet Take 325 mg by mouth daily.      Marland Kitchen CALCIUM PO Take 1 tablet by mouth 2 (two) times daily.       . Cholecalciferol (VITAMIN D3) 2000 UNITS TABS Take 2,000 Units by mouth  daily.      Marland Kitchen lisinopril-hydrochlorothiazide (PRINZIDE,ZESTORETIC) 10-12.5 MG per tablet Take 1 tablet by mouth daily.      . niacin (SLO-NIACIN) 500 MG tablet Take 500 mg by mouth 2 (two) times daily with a meal.      . Omega-3 Fatty Acids (OMEGA-3 FISH OIL) 500 MG CAPS Take 500 mg by mouth 2 (two) times daily.      . [DISCONTINUED] Calcium-Vitamin D (CALTRATE 600 PLUS-VIT D PO) Take by mouth 2 (two) times daily.        No current facility-administered medications for this visit.    Review of Systems Review of Systems  Constitutional: Negative for fever, chills and unexpected weight change.  HENT: Negative for congestion, hearing loss, sore throat, trouble swallowing and voice change.   Eyes: Negative for visual disturbance.  Respiratory: Negative for cough and wheezing.   Cardiovascular: Negative for chest pain, palpitations and leg swelling.  Gastrointestinal: Negative for nausea, vomiting, abdominal pain, diarrhea, constipation, blood in stool,  abdominal distention, anal bleeding and rectal pain.  Genitourinary: Negative for hematuria and difficulty urinating.  Musculoskeletal: Negative for arthralgias.  Skin: Negative for rash and wound.  Neurological: Negative for seizures, syncope, weakness and headaches.  Hematological: Negative for adenopathy. Does not bruise/bleed easily.  Psychiatric/Behavioral: Negative for confusion.    Blood pressure 146/90, pulse 68, temperature 98.2 F (36.8 C), temperature source Temporal, resp. rate 14, height 6\' 1"  (1.854 m), weight 250 lb 6.4 oz (113.581 kg).  Physical Exam Physical Exam  Constitutional: He appears well-developed and well-nourished.  HENT:  Head: Normocephalic and atraumatic.  Eyes: Pupils are equal, round, and reactive to light.  Neck: Normal range of motion.  Pulmonary/Chest: Effort normal and breath sounds normal.  Abdominal:    Musculoskeletal: Normal range of motion.  Neurological: He is alert.  Skin: Skin is warm and  dry.  Psychiatric: He has a normal mood and affect. His behavior is normal. Judgment and thought content normal.    Data Reviewed none   Assessment    LIH reducible    Plan    Pt wishes repair of LIH.The risk of hernia repair include bleeding,  Infection,   Recurrence of the hernia,  Mesh use, chronic pain,  Organ injury,  Bowel injury,  Bladder injury,   nerve injury with numbness around the incision,  Death,  and worsening of preexisting  medical problems.  The alternatives to surgery have been discussed as well..  Long term expectations of both operative and non operative treatments have been discussed.   The patient agrees to proceed.       Roberto Augustus A. 03/18/2013, 12:47 PM

## 2013-04-22 ENCOUNTER — Encounter (INDEPENDENT_AMBULATORY_CARE_PROVIDER_SITE_OTHER): Payer: Self-pay | Admitting: Surgery

## 2013-04-29 ENCOUNTER — Encounter (HOSPITAL_BASED_OUTPATIENT_CLINIC_OR_DEPARTMENT_OTHER): Payer: Self-pay | Admitting: *Deleted

## 2013-05-04 ENCOUNTER — Ambulatory Visit (HOSPITAL_BASED_OUTPATIENT_CLINIC_OR_DEPARTMENT_OTHER): Admission: RE | Admit: 2013-05-04 | Payer: BC Managed Care – PPO | Source: Ambulatory Visit | Admitting: Surgery

## 2013-05-04 SURGERY — REPAIR, HERNIA, INGUINAL, ADULT
Anesthesia: General | Laterality: Left

## 2013-05-20 ENCOUNTER — Encounter (INDEPENDENT_AMBULATORY_CARE_PROVIDER_SITE_OTHER): Payer: BC Managed Care – PPO | Admitting: Surgery

## 2013-07-08 ENCOUNTER — Encounter: Payer: Self-pay | Admitting: Internal Medicine

## 2013-07-08 DIAGNOSIS — R739 Hyperglycemia, unspecified: Secondary | ICD-10-CM

## 2013-07-08 DIAGNOSIS — R5381 Other malaise: Secondary | ICD-10-CM

## 2013-07-08 DIAGNOSIS — R5383 Other fatigue: Principal | ICD-10-CM

## 2013-07-11 ENCOUNTER — Other Ambulatory Visit: Payer: BC Managed Care – PPO

## 2013-07-12 ENCOUNTER — Other Ambulatory Visit (INDEPENDENT_AMBULATORY_CARE_PROVIDER_SITE_OTHER): Payer: BC Managed Care – PPO

## 2013-07-12 DIAGNOSIS — E039 Hypothyroidism, unspecified: Secondary | ICD-10-CM

## 2013-07-12 DIAGNOSIS — I10 Essential (primary) hypertension: Secondary | ICD-10-CM

## 2013-07-12 DIAGNOSIS — E785 Hyperlipidemia, unspecified: Secondary | ICD-10-CM

## 2013-07-12 LAB — LIPID PANEL
CHOLESTEROL: 207 mg/dL — AB (ref 0–200)
HDL: 36.6 mg/dL — ABNORMAL LOW (ref >39.00)
LDL Cholesterol: 146 mg/dL — ABNORMAL HIGH (ref 0–99)
TRIGLYCERIDES: 121 mg/dL (ref 0.0–149.0)
Total CHOL/HDL Ratio: 6
VLDL: 24.2 mg/dL (ref 0.0–40.0)

## 2013-07-12 LAB — HEPATIC FUNCTION PANEL
ALBUMIN: 3.8 g/dL (ref 3.5–5.2)
ALK PHOS: 35 U/L — AB (ref 39–117)
ALT: 27 U/L (ref 0–53)
AST: 23 U/L (ref 0–37)
Bilirubin, Direct: 0 mg/dL (ref 0.0–0.3)
TOTAL PROTEIN: 7 g/dL (ref 6.0–8.3)
Total Bilirubin: 0.7 mg/dL (ref 0.3–1.2)

## 2013-07-12 LAB — TSH: TSH: 1.44 u[IU]/mL (ref 0.35–5.50)

## 2013-07-12 LAB — BASIC METABOLIC PANEL WITH GFR
BUN: 19 mg/dL (ref 6–23)
CO2: 27 meq/L (ref 19–32)
Calcium: 9.2 mg/dL (ref 8.4–10.5)
Chloride: 102 meq/L (ref 96–112)
Creatinine, Ser: 0.9 mg/dL (ref 0.4–1.5)
GFR: 86.9 mL/min
Glucose, Bld: 96 mg/dL (ref 70–99)
Potassium: 3.7 meq/L (ref 3.5–5.1)
Sodium: 140 meq/L (ref 135–145)

## 2013-07-12 LAB — HEMOGLOBIN A1C: Hgb A1c MFr Bld: 5.8 % (ref 4.6–6.5)

## 2013-07-18 ENCOUNTER — Encounter: Payer: Self-pay | Admitting: Internal Medicine

## 2013-07-18 ENCOUNTER — Ambulatory Visit (INDEPENDENT_AMBULATORY_CARE_PROVIDER_SITE_OTHER): Payer: BC Managed Care – PPO | Admitting: Internal Medicine

## 2013-07-18 VITALS — BP 140/88 | HR 76 | Temp 97.8°F | Ht 73.0 in | Wt 252.0 lb

## 2013-07-18 DIAGNOSIS — I10 Essential (primary) hypertension: Secondary | ICD-10-CM

## 2013-07-18 DIAGNOSIS — M810 Age-related osteoporosis without current pathological fracture: Secondary | ICD-10-CM

## 2013-07-18 DIAGNOSIS — E785 Hyperlipidemia, unspecified: Secondary | ICD-10-CM

## 2013-07-18 MED ORDER — LISINOPRIL-HYDROCHLOROTHIAZIDE 10-12.5 MG PO TABS: 1.0000 | ORAL_TABLET | Freq: Every day | ORAL | Status: DC

## 2013-07-18 NOTE — Progress Notes (Signed)
Pre visit review using our clinic review tool, if applicable. No additional management support is needed unless otherwise documented below in the visit note. 

## 2013-07-18 NOTE — Progress Notes (Signed)
Subjective:    Patient ID: [name], male    DOB: 01-09-1951, 63 y.o.   MRN: [id number]  Hypertension Pertinent negatives include no neck pain or shortness of breath.   Follow up for improvement in labs and weight and diet Has improved diet and exercising daily   Review of Systems  Constitutional: Positive for fatigue. Negative for fever.  HENT: Negative for congestion, hearing loss and postnasal drip.   Eyes: Negative for discharge, redness and visual disturbance.  Respiratory: Negative for cough, shortness of breath and wheezing.   Cardiovascular: Negative for leg swelling.  Gastrointestinal: Negative for abdominal pain, constipation and abdominal distention.  Genitourinary: Negative for urgency and frequency.  Musculoskeletal: Negative for arthralgias, joint swelling and neck pain.  Skin: Negative for color change and rash.  Neurological: Positive for weakness. Negative for light-headedness.  Hematological: Negative for adenopathy.  Psychiatric/Behavioral: Negative for behavioral problems.       Past Medical History  Diagnosis Date  . Obesity   . Hyperlipidemia   . Allergy   . Diverticulosis   . Hemorrhoids   . Hx of adenomatous colonic polyps   . DVT (deep venous thrombosis)   . 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 Rhinitis   . Prostate cancer   . Hypertension     History   Social History  . Marital Status: Married    Spouse Name: N/A    Number of Children: N/A  . Years of Education: N/A   Occupational History  . retired    Social History Main Topics  . Smoking status: Never Smoker   . Smokeless tobacco: Never Used  . Alcohol Use: Yes     Comment: occasional  . Drug Use: Yes    Special: Other-see comments  . Sexual Activity: Not on file   Other Topics Concern  . Not on file   Social History Narrative  . No narrative on file    Past Surgical History  Procedure Laterality Date  . Eye surgery    . Fx l  tibia,fibula with external fixator    . Remove fixator    . Varicocele repair    . Bilateral vein ligation    . Prostatectomy  2013    Open radical prostatecomy  . Fracture surgery      Family History  Problem Relation Age of Onset  . Arthritis    . Diabetes    . Stroke    . Coronary artery disease    . Heart disease    . Colon polyps Father   . Stroke Father   . Cancer Maternal Grandmother   . Stroke Maternal Grandfather   . Cancer Paternal Grandmother     pancreatic  . Cancer Paternal Grandfather     Allergies  Allergen Reactions  . Adhesive [Tape]     Irritation.  . Fentanyl     REACTION: N/V  . Latex     Irritation at site.  . Levofloxacin     Light headed    Current Outpatient Prescriptions on File Prior to Visit  Medication Sig Dispense Refill  . aspirin 325 MG tablet Take 325 mg by mouth daily.      Marland Kitchen CALCIUM PO Take 1 tablet by mouth 2 (two) times daily.       . Cholecalciferol (VITAMIN D3) 2000 UNITS TABS Take 2,000 Units by mouth daily.      . magnesium 30 MG tablet Take 30 mg by mouth 2 (two)  times daily.      . niacin (SLO-NIACIN) 500 MG tablet Take 500 mg by mouth 2 (two) times daily with a meal.      . Omega-3 Fatty Acids (OMEGA-3 FISH OIL) 500 MG CAPS Take 500 mg by mouth 2 (two) times daily.      . phosphorus (K PHOS NEUTRAL) 155-852-130 MG tablet Take by mouth 2 (two) times daily.      . [DISCONTINUED] Calcium-Vitamin D (CALTRATE 600 PLUS-VIT D PO) Take by mouth 2 (two) times daily.        No current facility-administered medications on file prior to visit.    BP 140/88  Pulse 76  Temp(Src) 97.8 F (36.6 C) (Oral)  Ht 6\' 1"  (1.854 m)  Wt 252 lb (114.306 kg)  BMI 33.25 kg/m2  SpO2 95%   '  Objective:   Physical Exam  Nursing note reviewed. Constitutional: He is oriented to person, place, and time. He appears well-developed and well-nourished.  Eyes: Conjunctivae are normal. Pupils are equal, round, and reactive to light.  Neck: Normal  range of motion. Neck supple.  Cardiovascular:  Murmur heard.  Systolic murmur is present with a grade of 1/6  Abdominal: Soft. Bowel sounds are normal.  Musculoskeletal: Normal range of motion. He exhibits no edema.  Neurological: He is alert and oriented to person, place, and time.  Skin: Skin is warm and dry.  Psychiatric: He has a normal mood and affect. His behavior is normal.          Assessment & Plan:  weight management Improvement in lipids Stay the course bone denisty varicosity

## 2013-07-18 NOTE — Patient Instructions (Signed)
The patient is instructed to continue all medications as prescribed. Schedule followup with check out clerk upon leaving the clinic   BP 140/88  Pulse 76  Temp(Src) 97.8 F (36.6 C) (Oral)  Ht 6\' 1"  (1.854 m)  Wt 252 lb (114.306 kg)  BMI 33.25 kg/m2  SpO2 95%

## 2013-07-19 ENCOUNTER — Telehealth: Payer: Self-pay | Admitting: Internal Medicine

## 2013-07-19 ENCOUNTER — Ambulatory Visit (INDEPENDENT_AMBULATORY_CARE_PROVIDER_SITE_OTHER)
Admission: RE | Admit: 2013-07-19 | Discharge: 2013-07-19 | Disposition: A | Discharge status: Home / Self Care | Payer: BC Managed Care – PPO | Source: Ambulatory Visit | Attending: Internal Medicine | Admitting: Internal Medicine

## 2013-07-19 DIAGNOSIS — M810 Age-related osteoporosis without current pathological fracture: Secondary | ICD-10-CM

## 2013-07-19 NOTE — Telephone Encounter (Signed)
Relevant patient education assigned to patient using Emmi. ° °

## 2013-08-04 ENCOUNTER — Encounter: Payer: Self-pay | Admitting: Gastroenterology

## 2013-08-15 ENCOUNTER — Telehealth: Payer: Self-pay | Admitting: Internal Medicine

## 2013-08-15 DIAGNOSIS — C61 Malignant neoplasm of prostate: Secondary | ICD-10-CM

## 2013-08-15 NOTE — Telephone Encounter (Signed)
Pt states he needs a PSA test in MAY. Can you please enter the order?

## 2013-08-15 NOTE — Telephone Encounter (Signed)
Future orders placed 

## 2013-08-26 ENCOUNTER — Other Ambulatory Visit (INDEPENDENT_AMBULATORY_CARE_PROVIDER_SITE_OTHER): Payer: BC Managed Care – PPO

## 2013-08-26 DIAGNOSIS — C61 Malignant neoplasm of prostate: Secondary | ICD-10-CM

## 2013-08-26 LAB — PSA: PSA: 0 ng/mL — ABNORMAL LOW (ref 0.10–4.00)

## 2013-08-26 NOTE — Patient Instructions (Signed)
, °

## 2013-09-25 ENCOUNTER — Emergency Department (INDEPENDENT_AMBULATORY_CARE_PROVIDER_SITE_OTHER)
Admission: EM | Admit: 2013-09-25 | Discharge: 2013-09-25 | Disposition: A | Discharge status: Home / Self Care | Payer: BC Managed Care – PPO | Source: Home / Self Care | Attending: Family Medicine | Admitting: Family Medicine

## 2013-09-25 ENCOUNTER — Encounter (HOSPITAL_COMMUNITY): Payer: Self-pay | Admitting: Emergency Medicine

## 2013-09-25 DIAGNOSIS — J329 Chronic sinusitis, unspecified: Secondary | ICD-10-CM

## 2013-09-25 MED ORDER — AZITHROMYCIN 250 MG PO TABS: 250.0000 mg | ORAL_TABLET | Freq: Every day | ORAL | Status: DC

## 2013-09-25 MED ORDER — PREDNISONE 10 MG PO TABS: 30.0000 mg | ORAL_TABLET | Freq: Every day | ORAL | Status: DC

## 2013-09-25 MED ORDER — IPRATROPIUM-ALBUTEROL 0.5-2.5 (3) MG/3ML IN SOLN
3.0000 mL | Freq: Once | RESPIRATORY_TRACT | Status: AC
  Administered 2013-09-25: 3 mL via RESPIRATORY_TRACT

## 2013-09-25 MED ORDER — IPRATROPIUM BROMIDE 0.06 % NA SOLN: 2.0000 | Freq: Four times a day (QID) | NASAL | Status: DC

## 2013-09-25 MED ORDER — IPRATROPIUM-ALBUTEROL 0.5-2.5 (3) MG/3ML IN SOLN
RESPIRATORY_TRACT | Status: AC
  Filled 2013-09-25: qty 3

## 2013-09-25 MED ORDER — GUAIFENESIN-CODEINE 100-10 MG/5ML PO SOLN: 5.0000 mL | Freq: Every evening | ORAL | Status: DC | PRN

## 2013-09-25 NOTE — Discharge Instructions (Signed)
Thank you for coming in today. Call or go to the emergency room if you get worse, have trouble breathing, have chest pains, or palpitations.   Sinusitis Sinusitis is redness, soreness, and swelling (inflammation) of the paranasal sinuses. Paranasal sinuses are air pockets within the bones of your face (beneath the eyes, the middle of the forehead, or above the eyes). In healthy paranasal sinuses, mucus is able to drain out, and air is able to circulate through them by way of your nose. However, when your paranasal sinuses are inflamed, mucus and air can become trapped. This can allow bacteria and other germs to grow and cause infection. Sinusitis can develop quickly and last only a short time (acute) or continue over a long period (chronic). Sinusitis that lasts for more than 12 weeks is considered chronic.  CAUSES  Causes of sinusitis include:  Allergies.  Structural abnormalities, such as displacement of the cartilage that separates your nostrils (deviated septum), which can decrease the air flow through your nose and sinuses and affect sinus drainage.  Functional abnormalities, such as when the small hairs (cilia) that line your sinuses and help remove mucus do not work properly or are not present. SYMPTOMS  Symptoms of acute and chronic sinusitis are the same. The primary symptoms are pain and pressure around the affected sinuses. Other symptoms include:  Upper toothache.  Earache.  Headache.  Bad breath.  Decreased sense of smell and taste.  A cough, which worsens when you are lying flat.  Fatigue.  Fever.  Thick drainage from your nose, which often is green and may contain pus (purulent).  Swelling and warmth over the affected sinuses. DIAGNOSIS  Your caregiver will perform a physical exam. During the exam, your caregiver may:  Look in your nose for signs of abnormal growths in your nostrils (nasal polyps).  Tap over the affected sinus to check for signs of  infection.  View the inside of your sinuses (endoscopy) with a special imaging device with a light attached (endoscope), which is inserted into your sinuses. If your caregiver suspects that you have chronic sinusitis, one or more of the following tests may be recommended:  Allergy tests.  Nasal culture A sample of mucus is taken from your nose and sent to a lab and screened for bacteria.  Nasal cytology A sample of mucus is taken from your nose and examined by your caregiver to determine if your sinusitis is related to an allergy. TREATMENT  Most cases of acute sinusitis are related to a viral infection and will resolve on their own within 10 days. Sometimes medicines are prescribed to help relieve symptoms (pain medicine, decongestants, nasal steroid sprays, or saline sprays).  However, for sinusitis related to a bacterial infection, your caregiver will prescribe antibiotic medicines. These are medicines that will help kill the bacteria causing the infection.  Rarely, sinusitis is caused by a fungal infection. In theses cases, your caregiver will prescribe antifungal medicine. For some cases of chronic sinusitis, surgery is needed. Generally, these are cases in which sinusitis recurs more than 3 times per year, despite other treatments. HOME CARE INSTRUCTIONS   Drink plenty of water. Water helps thin the mucus so your sinuses can drain more easily.  Use a humidifier.  Inhale steam 3 to 4 times a day (for example, sit in the bathroom with the shower running).  Apply a warm, moist washcloth to your face 3 to 4 times a day, or as directed by your caregiver.  Use saline nasal sprays  to help moisten and clean your sinuses.  Take over-the-counter or prescription medicines for pain, discomfort, or fever only as directed by your caregiver. SEEK IMMEDIATE MEDICAL CARE IF:  You have increasing pain or severe headaches.  You have nausea, vomiting, or drowsiness.  You have swelling around your  face.  You have vision problems.  You have a stiff neck.  You have difficulty breathing. MAKE SURE YOU:   Understand these instructions.  Will watch your condition.  Will get help right away if you are not doing well or get worse. Document Released: 04/14/2005 Document Revised: 07/07/2011 Document Reviewed: 04/29/2011 [facility] Patient Information 2014 [place], [place].

## 2013-09-25 NOTE — ED Provider Notes (Signed)
[name] is a 63 y.o. male who presents to Urgent Care today for facial pain and pressure associated with cough. Symptoms for the last 3 days. His wife has similar symptoms. No chest pains palpitations or shortness of breath. He's tried a netti pot and over-the-counter cough medications which have not helped much. No leg swelling. He recently traveled to [place] but notes his symptoms were occurring before he flew back. He feels well otherwise.   Past Medical History  Diagnosis Date  . Obesity   . Hyperlipidemia   . Allergy   . Diverticulosis   . Hemorrhoids   . Hx of adenomatous colonic polyps   . DVT (deep venous thrombosis)   . 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 Rhinitis   . Prostate cancer   . Hypertension    History  Substance Use Topics  . Smoking status: Never Smoker   . Smokeless tobacco: Never Used  . Alcohol Use: Yes     Comment: occasional   ROS as above Medications: No current facility-administered medications for this encounter.   Current Outpatient Prescriptions  Medication Sig Dispense Refill  . aspirin 325 MG tablet Take 325 mg by mouth daily.      Marland Kitchen azithromycin (ZITHROMAX) 250 MG tablet Take 1 tablet (250 mg total) by mouth daily. Take first 2 tablets together, then 1 every day until finished.  6 tablet  0  . CALCIUM PO Take 1 tablet by mouth 2 (two) times daily.       . Cholecalciferol (VITAMIN D3) 2000 UNITS TABS Take 2,000 Units by mouth daily.      Marland Kitchen guaiFENesin-codeine 100-10 MG/5ML syrup Take 5 mLs by mouth at bedtime as needed for cough.  120 mL  0  . ipratropium (ATROVENT) 0.06 % nasal spray Place 2 sprays into both nostrils 4 (four) times daily.  15 mL  1  . lisinopril-hydrochlorothiazide (PRINZIDE,ZESTORETIC) 10-12.5 MG per tablet Take 1 tablet by mouth daily.  90 tablet  3  . magnesium 30 MG tablet Take 30 mg by mouth 2 (two) times daily.      . niacin (SLO-NIACIN) 500 MG tablet Take 500 mg by mouth 2 (two)  times daily with a meal.      . Omega-3 Fatty Acids (OMEGA-3 FISH OIL) 500 MG CAPS Take 500 mg by mouth 2 (two) times daily.      . phosphorus (K PHOS NEUTRAL) 155-852-130 MG tablet Take by mouth 2 (two) times daily.      . predniSONE (DELTASONE) 10 MG tablet Take 3 tablets (30 mg total) by mouth daily.  15 tablet  0  . [DISCONTINUED] Calcium-Vitamin D (CALTRATE 600 PLUS-VIT D PO) Take by mouth 2 (two) times daily.         Exam:  BP 136/66  Pulse 81  Temp(Src) 98.4 F (36.9 C) (Oral)  Resp 18  SpO2 96% Gen: Well NAD HEENT: EOMI,  MMM and plan nasal turbinates with clear discharge present bilaterally. The left tympanic membranes normal. The right is occluded by cerumen. The posterior pharynx has cobblestoning but is normal otherwise. Bilateral maxillary sinuses are nontender. Lungs: Normal work of breathing. CTABL Heart: RRR no MRG Abd: NABS, Soft. NT, ND Exts: Brisk capillary refill, warm and well perfused. Non-edematous. Nontender calves bilaterally  Patient was given a DuoNeb nebulizer treatment and did not feel any better.   No results found for this or any previous visit (from the past 24 hour(s)). No results found.  Assessment  and Plan: 63 y.o. male with sinusitis likely viral. Plan to treat with prednisone, Atrovent nasal spray, codeine containing cough medication, and azithromycin. Doubtful for DVT given his symptoms are more sinus than lungs he has no leg swelling or chest pain or palpitations. Followup with primary care provider as needed.  Discussed warning signs or symptoms. Please see discharge instructions. Patient expresses understanding.    Gregor Hams, MD 09/25/13 [phone]

## 2013-09-25 NOTE — ED Notes (Signed)
C/o nasal congestion and cough States sx started from a bus trip See physician note

## 2013-12-17 ENCOUNTER — Emergency Department (INDEPENDENT_AMBULATORY_CARE_PROVIDER_SITE_OTHER)
Admission: EM | Admit: 2013-12-17 | Discharge: 2013-12-17 | Disposition: A | Discharge status: Home / Self Care | Payer: BC Managed Care – PPO | Source: Home / Self Care | Attending: Family Medicine | Admitting: Family Medicine

## 2013-12-17 ENCOUNTER — Emergency Department (INDEPENDENT_AMBULATORY_CARE_PROVIDER_SITE_OTHER): Payer: BC Managed Care – PPO

## 2013-12-17 ENCOUNTER — Ambulatory Visit: Payer: BC Managed Care – PPO | Admitting: Family Medicine

## 2013-12-17 ENCOUNTER — Encounter (HOSPITAL_COMMUNITY): Payer: Self-pay | Admitting: Emergency Medicine

## 2013-12-17 DIAGNOSIS — L02619 Cutaneous abscess of unspecified foot: Secondary | ICD-10-CM

## 2013-12-17 DIAGNOSIS — L03031 Cellulitis of right toe: Secondary | ICD-10-CM

## 2013-12-17 DIAGNOSIS — L03039 Cellulitis of unspecified toe: Secondary | ICD-10-CM

## 2013-12-17 MED ORDER — CEPHALEXIN 500 MG PO CAPS: 500.0000 mg | ORAL_CAPSULE | Freq: Four times a day (QID) | ORAL | Status: DC

## 2013-12-17 NOTE — ED Notes (Signed)
Reports on 7/23 was seen at an urgent care for right great toe issues.  Dx with cellulitis, treated with rocephin and 10 days of doxycycline.  Patient/family reports toe was better/all clear.  Yesterday noticed skin peeled in exact location of previous infection.  Last night used antibiotic cream .  This am feels pain where skin has peeled.  Area is swollen and painful

## 2013-12-17 NOTE — Discharge Instructions (Signed)
Soak twice a day and protect as discussed, see your doctor or return as needed, take all of antibiotic.

## 2013-12-17 NOTE — ED Provider Notes (Signed)
CSN: [id number]     Arrival date & time 12/17/13  0902 History   First MD Initiated Contact with Patient 12/17/13 0920     Chief Complaint  Patient presents with  . Toe Pain   (Consider location/radiation/quality/duration/timing/severity/associated sxs/prior Treatment) Patient is a 63 y.o. male presenting with toe pain. The history is provided by the patient.  Toe Pain This is a recurrent problem. The current episode started yesterday (cellulitis on 7/23 recurred yest with erythema and skin peeling.). The problem has been gradually worsening.    Past Medical History  Diagnosis Date  . Obesity   . Hyperlipidemia   . Allergy   . Diverticulosis   . Hemorrhoids   . Hx of adenomatous colonic polyps   . DVT (deep venous thrombosis)   . 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 Rhinitis   . Prostate cancer   . Hypertension    Past Surgical History  Procedure Laterality Date  . Eye surgery    . Fx l tibia,fibula with external fixator    . Remove fixator    . Varicocele repair    . Bilateral vein ligation    . Prostatectomy  2013    Open radical prostatecomy  . Fracture surgery     Family History  Problem Relation Age of Onset  . Arthritis    . Diabetes    . Stroke    . Coronary artery disease    . Heart disease    . Colon polyps Father   . Stroke Father   . Cancer Maternal Grandmother   . Stroke Maternal Grandfather   . Cancer Paternal Grandmother     pancreatic  . Cancer Paternal Grandfather    History  Substance Use Topics  . Smoking status: Never Smoker   . Smokeless tobacco: Never Used  . Alcohol Use: Yes     Comment: occasional    Review of Systems  Constitutional: Negative.   Musculoskeletal: Positive for joint swelling. Negative for gait problem.  Skin: Positive for wound.    Allergies  Adhesive; Fentanyl; Latex; and Levofloxacin  Home Medications   Prior to Admission medications   Medication Sig Start Date End Date  Taking? Authorizing Provider  aspirin 325 MG tablet Take 325 mg by mouth daily.    Historical Provider, MD  azithromycin (ZITHROMAX) 250 MG tablet Take 1 tablet (250 mg total) by mouth daily. Take first 2 tablets together, then 1 every day until finished. 09/25/13   Gregor Hams, MD  CALCIUM PO Take 1 tablet by mouth 2 (two) times daily.     Historical Provider, MD  cephALEXin (KEFLEX) 500 MG capsule Take 1 capsule (500 mg total) by mouth 4 (four) times daily. Take all of medicine and drink lots of fluids 12/17/13   Billy Fischer, MD  Cholecalciferol (VITAMIN D3) 2000 UNITS TABS Take 2,000 Units by mouth daily.    Historical Provider, MD  guaiFENesin-codeine 100-10 MG/5ML syrup Take 5 mLs by mouth at bedtime as needed for cough. 09/25/13   Gregor Hams, MD  ipratropium (ATROVENT) 0.06 % nasal spray Place 2 sprays into both nostrils 4 (four) times daily. 09/25/13   Gregor Hams, MD  lisinopril-hydrochlorothiazide (PRINZIDE,ZESTORETIC) 10-12.5 MG per tablet Take 1 tablet by mouth daily. 07/18/13   Ricard Dillon, MD  magnesium 30 MG tablet Take 30 mg by mouth 2 (two) times daily.    Historical Provider, MD  niacin (SLO-NIACIN) 500 MG tablet Take 500 mg by  mouth 2 (two) times daily with a meal.    Historical Provider, MD  Omega-3 Fatty Acids (OMEGA-3 FISH OIL) 500 MG CAPS Take 500 mg by mouth 2 (two) times daily.    Historical Provider, MD  phosphorus (K PHOS NEUTRAL) 155-852-130 MG tablet Take by mouth 2 (two) times daily.    Historical Provider, MD  predniSONE (DELTASONE) 10 MG tablet Take 3 tablets (30 mg total) by mouth daily. 09/25/13   Gregor Hams, MD   BP 123/84  Pulse 76  Temp(Src) 97.5 F (36.4 C) (Oral)  Resp 20  SpO2 96% Physical Exam  Nursing note and vitals reviewed. Constitutional: He is oriented to person, place, and time. He appears well-developed and well-nourished.  Musculoskeletal: He exhibits tenderness.  Neurological: He is alert and oriented to person, place, and time.  Skin:  Skin is warm and dry. There is erythema.  Mild erythema and skin peeling over medial mp skin of distal phalanx of right gt toe, no fluctuance or streaking.    ED Course  Procedures (including critical care time) Labs Review Labs Reviewed - No data to display  Imaging Review Dg Toe Great Right  12/17/2013   CLINICAL DATA:  Right toe infection, swollen and painful  EXAM: RIGHT GREAT TOE  COMPARISON:  None.  FINDINGS: Mild soft tissue swelling about the great toe. No evidence of bony abnormality. No fracture, malalignment or osseous lesion. Normal bony mineralization. No subcutaneous emphysema or retained radiopaque foreign body.  IMPRESSION: Soft tissue swelling without evidence of osseous abnormality, subcutaneous gas or retained radiopaque foreign body.   Electronically Signed   By: Jacqulynn Cadet M.D.   On: 12/17/2013 10:21     MDM   1. Cellulitis of great toe, right        Billy Fischer, MD 12/17/13 1041

## 2013-12-19 ENCOUNTER — Encounter: Payer: Self-pay | Admitting: Family Medicine

## 2013-12-19 ENCOUNTER — Ambulatory Visit (INDEPENDENT_AMBULATORY_CARE_PROVIDER_SITE_OTHER): Payer: BC Managed Care – PPO | Admitting: Family Medicine

## 2013-12-19 ENCOUNTER — Telehealth: Payer: Self-pay | Admitting: Internal Medicine

## 2013-12-19 VITALS — BP 130/88 | HR 64 | Temp 98.3°F | Ht 72.0 in | Wt 248.0 lb

## 2013-12-19 DIAGNOSIS — L03031 Cellulitis of right toe: Secondary | ICD-10-CM

## 2013-12-19 DIAGNOSIS — M25579 Pain in unspecified ankle and joints of unspecified foot: Secondary | ICD-10-CM

## 2013-12-19 DIAGNOSIS — L02619 Cutaneous abscess of unspecified foot: Secondary | ICD-10-CM

## 2013-12-19 DIAGNOSIS — L03039 Cellulitis of unspecified toe: Secondary | ICD-10-CM

## 2013-12-19 MED ORDER — DOXYCYCLINE HYCLATE 100 MG PO TABS: 100.0000 mg | ORAL_TABLET | Freq: Two times a day (BID) | ORAL | Status: DC

## 2013-12-19 NOTE — Telephone Encounter (Signed)
Patient Information:  Caller Name: [place]  Phone: [phone]  Patient: [name], [name]  Gender: Male  DOB: [birth year]  Age: 63 Years  PCP: Benay Pillow (Adults only)  Office Follow Up:  Does the office need to follow up with this patient?: No  Instructions For The Office: N/A   Symptoms  Reason For Call & Symptoms: Pt calling regarding questions about taking Vibra tabs (doxycycline) and other meds at the same time. Verified in Micromedx 2.0, that Calcium could interfere with the absorption of the antibiotic or the inverse. Pt to hold Calcium and Vitamin D until he is finished with antibx for toe cellulitis. No further questions.  Reviewed Health History In EMR: Yes  Reviewed Medications In EMR: Yes  Reviewed Allergies In EMR: Yes  Reviewed Surgeries / Procedures: Yes  Date of Onset of Symptoms: 12/19/2013  Guideline(s) Used:  No Protocol Available - Sick Adult  Disposition Per Guideline:   Home Care  Reason For Disposition Reached:   Patient's symptoms are safe to treat at home per nursing judgment  Advice Given:  Call Back If:  New symptoms develop  You become worse.  Patient Will Follow Care Advice:  YES

## 2013-12-19 NOTE — Progress Notes (Signed)
  Garret Reddish, MD Phone: [phone]  Subjective:   [name] is a 63 y.o. year old very pleasant male patient who presents with the following:  Cellulitis Patient was diagnosed with cellulitis at [facility] on 7/23 of his right great toe near the IP joint. He states it extended up his forefoot. He was treated with doxycycline for 10 days with complete resolution. Then on 8/21 or 8/22 patient developed redness and mild skin peeling in a similar distribution. [facility] and was given Keflex for cellulitis. Patient states symptoms were stable for day and had noticeably worsened when he woke up this morning. Complains of pain in the area as well as warmth and redness. Patient has tried ibuprofen which gives mild relief. No other treatments tried including ice.  ROS-no fevers chills nausea or vomiting. Mildly expanding redness.  Past Medical History-prostate cancer, hypertension, hyperlipidemia. Denies history of gout.  Medications- reviewed and updated Current Outpatient Prescriptions  Medication Sig Dispense Refill  . aspirin 325 MG tablet Take 325 mg by mouth daily.      Marland Kitchen CALCIUM PO Take 1 tablet by mouth 2 (two) times daily.       . cephALEXin (KEFLEX) 500 MG capsule Take 1 capsule (500 mg total) by mouth 4 (four) times daily. Take all of medicine and drink lots of fluids  28 capsule  0  . Cholecalciferol (VITAMIN D3) 2000 UNITS TABS Take 2,000 Units by mouth daily.      Marland Kitchen lisinopril-hydrochlorothiazide (PRINZIDE,ZESTORETIC) 10-12.5 MG per tablet Take 1 tablet by mouth daily.  90 tablet  3  . magnesium 30 MG tablet Take 30 mg by mouth 2 (two) times daily.      . niacin (SLO-NIACIN) 500 MG tablet Take 500 mg by mouth 2 (two) times daily with a meal.      . Omega-3 Fatty Acids (OMEGA-3 FISH OIL) 500 MG CAPS Take 500 mg by mouth 2 (two) times daily.      . [DISCONTINUED] Calcium-Vitamin D (CALTRATE 600 PLUS-VIT D PO) Take by mouth 2 (two) times daily.        No  current facility-administered medications for this visit.    Objective: BP 130/88  Pulse 64  Temp(Src) 98.3 F (36.8 C)  Ht 6' (1.829 m)  Wt 248 lb (112.492 kg)  BMI 33.63 kg/m2 Gen: NAD, resting comfortably in chair Extremities-right great toe with erythema near IP joint on the medial side. Patient tender to palpation over erythematous area. He also has pain with movement of the joint but on the medial side only.    Assessment/Plan:  Cellulitis Given worsening on Keflex changed patient to doxycycline today for ten-day course. Will return on Wednesday or Friday for recheck depending on how symptoms do. Gout is in the differential, could consider uric acid level and not an acute flare. I do not think this has the classic appearance of gout and is more consistent with cellulitis  Meds ordered this encounter  Medications  . doxycycline (VIBRA-TABS) 100 MG tablet    Sig: Take 1 tablet (100 mg total) by mouth 2 (two) times daily.    Dispense:  20 tablet    Refill:  0

## 2013-12-19 NOTE — Telephone Encounter (Signed)
Noted  

## 2013-12-19 NOTE — Patient Instructions (Addendum)
Wonderful to meet you. I am concerned that the antibiotic has not halted the progression of the cellulitis so I would like to change to doxycycline today. Take the first dose when you get it then later today.  Let's check in Friday regardless of how things are going, if symptoms worsen rapidly or if not improving by Wednesday, please see [place] back.   For front: ok to book as physical in November as 3rd or 4th 30 minute appointment of the day Health Maintenance Due  Topic Date Due  . Influenza Vaccine  11/26/2013

## 2013-12-23 ENCOUNTER — Encounter: Payer: Self-pay | Admitting: Family Medicine

## 2013-12-23 ENCOUNTER — Ambulatory Visit (INDEPENDENT_AMBULATORY_CARE_PROVIDER_SITE_OTHER): Payer: BC Managed Care – PPO | Admitting: Family Medicine

## 2013-12-23 VITALS — BP 148/83 | HR 72 | Temp 98.3°F | Wt 250.0 lb

## 2013-12-23 DIAGNOSIS — L03031 Cellulitis of right toe: Secondary | ICD-10-CM

## 2013-12-23 DIAGNOSIS — L03039 Cellulitis of unspecified toe: Secondary | ICD-10-CM

## 2013-12-23 DIAGNOSIS — L02619 Cutaneous abscess of unspecified foot: Secondary | ICD-10-CM

## 2013-12-23 MED ORDER — DOXYCYCLINE HYCLATE 100 MG PO TABS: 100.0000 mg | ORAL_TABLET | Freq: Two times a day (BID) | ORAL | Status: DC

## 2013-12-23 NOTE — Patient Instructions (Signed)
Cellulitis  Improved on my exam  I understand your concern being out of the country  I gave you 10 more days of antibiotic in case there is recurrence  If you took this and did not improve within 36 hours, seek care  Have a great trip!   See you in November for physical Dr. Yong Channel

## 2013-12-23 NOTE — Progress Notes (Signed)
  Garret Reddish, MD Phone: [phone]  Subjective:   [name] is a 63 y.o. year old very pleasant male patient who presents with the following:  Cellulitis  History: Patient was diagnosed with cellulitis at [facility] on 7/23 of his right great toe near the IP joint. He states it extended up his forefoot. He was treated with doxycycline for 10 days with complete resolution. Then on 8/21 or 8/22 patient developed redness and mild skin peeling in a similar distribution. [facility] and was given Keflex for cellulitis on 8/22. On 8/24, patient was seen in our office due to worsening of symptoms that morning including expanding redness and worsening pain. He was changed to doxycycline.   Today, patient returns for follow up. Redness has not drastically improved but swelling and pain have. He feels area is much better but wife is concerned as they are planning to go out of the country for 18 days.   ROS-no fevers chills nausea or vomiting. Mildly expanding redness.  Past Medical History-prostate cancer, hypertension, hyperlipidemia. Denies history of gout.  Medications- reviewed and updated Current Outpatient Prescriptions  Medication Sig Dispense Refill  . aspirin 325 MG tablet Take 325 mg by mouth daily.      Marland Kitchen CALCIUM PO Take 1 tablet by mouth 2 (two) times daily.       . Cholecalciferol (VITAMIN D3) 2000 UNITS TABS Take 2,000 Units by mouth daily.      Marland Kitchen doxycycline (VIBRA-TABS) 100 MG tablet Take 1 tablet (100 mg total) by mouth 2 (two) times daily.  20 tablet  0  . lisinopril-hydrochlorothiazide (PRINZIDE,ZESTORETIC) 10-12.5 MG per tablet Take 1 tablet by mouth daily.  90 tablet  3  . magnesium 30 MG tablet Take 30 mg by mouth 2 (two) times daily.      . niacin (SLO-NIACIN) 500 MG tablet Take 500 mg by mouth 2 (two) times daily with a meal.      . Omega-3 Fatty Acids (OMEGA-3 FISH OIL) 500 MG CAPS Take 500 mg by mouth 2 (two) times daily.      . [DISCONTINUED]  Calcium-Vitamin D (CALTRATE 600 PLUS-VIT D PO) Take by mouth 2 (two) times daily.        No current facility-administered medications for this visit.    Objective: BP 148/83  Pulse 72  Temp(Src) 98.3 F (36.8 C)  Wt 250 lb (113.399 kg) Gen: NAD, resting comfortably in chair Extremities-right great toe with mild erythema near IP joint on the medial side, has receeded about 1 cm from previous line. Patient no longer tender to palpation over erythematous area except for 1 small area. No pain with movement of joint. Area cool to touch like other areas on foot.   Assessment/Plan:  Cellulitis Much improved on my exam. Patient is to complete course of doxycycline. I did give him another ten-day prescription in case he were to have recurrence when out of the country. Patient will establish with me in November for physical. I still think gout is in the differential and could consider uric acid in followup.   Reviewed problem list, need to review full history at physical  Meds ordered this encounter  Medications  . doxycycline (VIBRA-TABS) 100 MG tablet    Sig: Take 1 tablet (100 mg total) by mouth 2 (two) times daily.    Dispense:  20 tablet    Refill:  0

## 2013-12-26 ENCOUNTER — Ambulatory Visit (INDEPENDENT_AMBULATORY_CARE_PROVIDER_SITE_OTHER): Payer: BC Managed Care – PPO | Admitting: Family Medicine

## 2013-12-26 ENCOUNTER — Encounter: Payer: Self-pay | Admitting: Family Medicine

## 2013-12-26 VITALS — BP 164/91 | HR 71 | Temp 98.5°F | Wt 254.0 lb

## 2013-12-26 DIAGNOSIS — M79674 Pain in right toe(s): Secondary | ICD-10-CM

## 2013-12-26 DIAGNOSIS — L03039 Cellulitis of unspecified toe: Secondary | ICD-10-CM

## 2013-12-26 DIAGNOSIS — L03031 Cellulitis of right toe: Secondary | ICD-10-CM

## 2013-12-26 DIAGNOSIS — M79609 Pain in unspecified limb: Secondary | ICD-10-CM

## 2013-12-26 DIAGNOSIS — L02619 Cutaneous abscess of unspecified foot: Secondary | ICD-10-CM

## 2013-12-26 LAB — CBC WITH DIFFERENTIAL/PLATELET
BASOS PCT: 0.7 % (ref 0.0–3.0)
Basophils Absolute: 0.1 10*3/uL (ref 0.0–0.1)
EOS ABS: 0.2 10*3/uL (ref 0.0–0.7)
Eosinophils Relative: 2.6 % (ref 0.0–5.0)
HCT: 45.3 % (ref 39.0–52.0)
Hemoglobin: 15 g/dL (ref 13.0–17.0)
LYMPHS PCT: 25.8 % (ref 12.0–46.0)
Lymphs Abs: 2 10*3/uL (ref 0.7–4.0)
MCHC: 33.3 g/dL (ref 30.0–36.0)
MCV: 85.6 fl (ref 78.0–100.0)
Monocytes Absolute: 0.6 10*3/uL (ref 0.1–1.0)
Monocytes Relative: 7.7 % (ref 3.0–12.0)
NEUTROS PCT: 63.2 % (ref 43.0–77.0)
Neutro Abs: 4.9 10*3/uL (ref 1.4–7.7)
PLATELETS: 249 10*3/uL (ref 150.0–400.0)
RBC: 5.29 Mil/uL (ref 4.22–5.81)
RDW: 15.1 % (ref 11.5–15.5)
WBC: 7.8 10*3/uL (ref 4.0–10.5)

## 2013-12-26 LAB — COMPREHENSIVE METABOLIC PANEL
ALBUMIN: 3.5 g/dL (ref 3.5–5.2)
ALT: 18 U/L (ref 0–53)
AST: 22 U/L (ref 0–37)
Alkaline Phosphatase: 37 U/L — ABNORMAL LOW (ref 39–117)
BUN: 15 mg/dL (ref 6–23)
CO2: 26 mEq/L (ref 19–32)
Calcium: 9.1 mg/dL (ref 8.4–10.5)
Chloride: 102 mEq/L (ref 96–112)
Creatinine, Ser: 0.9 mg/dL (ref 0.4–1.5)
GFR: 91.29 mL/min (ref >60.00)
Glucose, Bld: 81 mg/dL (ref 70–99)
POTASSIUM: 4.2 meq/L (ref 3.5–5.1)
Sodium: 139 mEq/L (ref 135–145)
Total Bilirubin: 0.5 mg/dL (ref 0.2–1.2)
Total Protein: 7.2 g/dL (ref 6.0–8.3)

## 2013-12-26 LAB — URIC ACID: Uric Acid, Serum: 6.9 mg/dL (ref 4.0–7.8)

## 2013-12-26 MED ORDER — INDOMETHACIN 50 MG PO CAPS: 50.0000 mg | ORAL_CAPSULE | Freq: Three times a day (TID) | ORAL | Status: DC

## 2013-12-26 NOTE — Patient Instructions (Signed)
Check labs.   Refer to sports medicine  Antiinflammatory for 3-5 days 3x per day. This would help with gout or flare of arthritis.   Trying to get you in with Dr. Tamala Julian this week.   Obviously, if symptoms flare after you finish doxy, please return to see [place] or seek care in ED as you may need IV antibiotics (redness expanding up foot)

## 2013-12-26 NOTE — Progress Notes (Signed)
Garret Reddish, MD Phone: [phone]  Subjective:   [name] is a 63 y.o. year old very pleasant male patient who presents with the following:  Right toe pain/redness History: Patient was diagnosed with cellulitis at [facility] on 7/23 of his right great toe near the IP joint. He states it extended up his forefoot. He was treated with doxycycline for 10 days with complete resolution. Then on 8/21 or 8/22 patient developed redness and mild skin peeling in a similar distribution. [facility] and was given Keflex for cellulitis on 8/22. On 8/24, patient was seen in our office due to worsening of symptoms that morning including expanding redness and worsening pain. He was changed to doxycycline. Seen back on 8/28 and had some mild improvement.   Today, patient returns for follow up. He returns due to symptoms not improving. I had instructed patient if no improvement, he should not proceed forward with his trip out of the country. He states yesterday, the toe was very red when he woke up and he was concerned symptoms were worsening. Continued throughout the day. He was on his feet some and this worsened symptoms especially when toe would bend. This morning he notes it is similar to 2 days ago and is improved as compared to yesterday. He still has 5 pills left of doxycycline.  ROS-no fevers chills nausea or vomiting. No expanding redness.  Past Medical History-prostate cancer, hypertension, hyperlipidemia. Denies history of gout.  Medications- reviewed and updated Current Outpatient Prescriptions  Medication Sig Dispense Refill  . aspirin 325 MG tablet Take 325 mg by mouth daily.      Marland Kitchen CALCIUM PO Take 1 tablet by mouth 2 (two) times daily.       . Cholecalciferol (VITAMIN D3) 2000 UNITS TABS Take 2,000 Units by mouth daily.      Marland Kitchen doxycycline (VIBRA-TABS) 100 MG tablet Take 1 tablet (100 mg total) by mouth 2 (two) times daily.  20 tablet  0  . lisinopril-hydrochlorothiazide  (PRINZIDE,ZESTORETIC) 10-12.5 MG per tablet Take 1 tablet by mouth daily.  90 tablet  3  . magnesium 30 MG tablet Take 30 mg by mouth 2 (two) times daily.      . niacin (SLO-NIACIN) 500 MG tablet Take 500 mg by mouth 2 (two) times daily with a meal.      . Omega-3 Fatty Acids (OMEGA-3 FISH OIL) 500 MG CAPS Take 500 mg by mouth 2 (two) times daily.      . [DISCONTINUED] Calcium-Vitamin D (CALTRATE 600 PLUS-VIT D PO) Take by mouth 2 (two) times daily.        No current facility-administered medications for this visit.    Objective: BP 164/91  Pulse 71  Temp(Src) 98.5 F (36.9 C)  Wt 254 lb (115.214 kg) Gen: NAD, resting comfortably in chair Extremities-right great toe with mild erythema near IP joint on the medial side, similar distribution as last visit. Mild tenderness along medial joint line. No tenderness over most of erythema. Pain with forced flexion of IP joint.  Area cool to touch like other areas on foot.   Results for orders placed in visit on 12/26/13 (from the past 24 hour(s))  CBC WITH DIFFERENTIAL     Status: None   Collection Time    12/26/13 10:16 AM      Result Value Ref Range   WBC 7.8  4.0 - 10.5 K/uL   RBC 5.29  4.22 - 5.81 Mil/uL   Hemoglobin 15.0  13.0 - 17.0 g/dL   HCT 45.3  39.0 - 52.0 %   MCV 85.6  78.0 - 100.0 fl   MCHC 33.3  30.0 - 36.0 g/dL   RDW 15.1  11.5 - 15.5 %   Platelets 249.0  150.0 - 400.0 K/uL   Neutrophils Relative % 63.2  43.0 - 77.0 %   Lymphocytes Relative 25.8  12.0 - 46.0 %   Monocytes Relative 7.7  3.0 - 12.0 %   Eosinophils Relative 2.6  0.0 - 5.0 %   Basophils Relative 0.7  0.0 - 3.0 %   Neutro Abs 4.9  1.4 - 7.7 K/uL   Lymphs Abs 2.0  0.7 - 4.0 K/uL   Monocytes Absolute 0.6  0.1 - 1.0 K/uL   Eosinophils Absolute 0.2  0.0 - 0.7 K/uL   Basophils Absolute 0.1  0.0 - 0.1 K/uL  COMPREHENSIVE METABOLIC PANEL     Status: Abnormal   Collection Time    12/26/13 10:16 AM      Result Value Ref Range   Sodium 139  135 - 145 mEq/L    Potassium 4.2  3.5 - 5.1 mEq/L   Chloride 102  96 - 112 mEq/L   CO2 26  19 - 32 mEq/L   Glucose, Bld 81  70 - 99 mg/dL   BUN 15  6 - 23 mg/dL   Creatinine, Ser 0.9  0.4 - 1.5 mg/dL   Total Bilirubin 0.5  0.2 - 1.2 mg/dL   Alkaline Phosphatase 37 (*) 39 - 117 U/L   AST 22  0 - 37 U/L   ALT 18  0 - 53 U/L   Total Protein 7.2  6.0 - 8.3 g/dL   Albumin 3.5  3.5 - 5.2 g/dL   Calcium 9.1  8.4 - 10.5 mg/dL   GFR 91.29  >60.00 mL/min  URIC ACID     Status: None   Collection Time    12/26/13 10:16 AM      Result Value Ref Range   Uric Acid, Serum 6.9  4.0 - 7.8 mg/dL     Assessment/Plan:  Right great toe pain This is the 3rd visit to this office within a week and 4th in 10 days seeking care for presumed cellulitis (recurrent episode with similar episode last month). Family is very concerned and asks about referral to either ID or dermatology. Patient is 7 days into doxycycline treatment and continues to have waxing/waning symptoms. I have a low suspicion at this point that this is cellulitis. Primary pain I can find is on medial joint line and with joint flexion. Patient also concerned because he was told he had a bone spur by Dr. Juventino Slovak. I discussed this may have been reactive changes in toe to underlying joint disease.  I discussed with family that I think basic labs and a referral to Dr. Tamala Julian of sports medicine would be reasonable first step. I would greatly appreciate his orthopedic expertise as well as medical opinion on this matter and he ultimately could refer or ask me to refer to ID (doubt infection) or dermatology (suspect low yield currently).   Regarding labs:Uric acid was not overtly elevated (considering gout diagnosis) but would be difficult to interpret in flare and exam is not classic for gout. No elevated white count on CBC at least points away but does not disprove cellulitis.

## 2013-12-27 ENCOUNTER — Encounter: Payer: Self-pay | Admitting: Family Medicine

## 2013-12-27 ENCOUNTER — Ambulatory Visit (INDEPENDENT_AMBULATORY_CARE_PROVIDER_SITE_OTHER): Payer: BC Managed Care – PPO | Admitting: Family Medicine

## 2013-12-27 ENCOUNTER — Other Ambulatory Visit (INDEPENDENT_AMBULATORY_CARE_PROVIDER_SITE_OTHER): Payer: BC Managed Care – PPO

## 2013-12-27 VITALS — BP 136/88 | HR 58 | Ht 73.0 in | Wt 251.0 lb

## 2013-12-27 DIAGNOSIS — M79674 Pain in right toe(s): Secondary | ICD-10-CM

## 2013-12-27 DIAGNOSIS — M79609 Pain in unspecified limb: Secondary | ICD-10-CM

## 2013-12-27 DIAGNOSIS — M79676 Pain in unspecified toe(s): Secondary | ICD-10-CM

## 2013-12-27 DIAGNOSIS — M109 Gout, unspecified: Secondary | ICD-10-CM | POA: Insufficient documentation

## 2013-12-27 MED ORDER — LOSARTAN POTASSIUM 100 MG PO TABS: 100.0000 mg | ORAL_TABLET | Freq: Every day | ORAL | Status: DC

## 2013-12-27 NOTE — Progress Notes (Signed)
[name] [place] [name], [name] [address] Phone: [phone] Subjective:    I'm seeing this patient by the request  of:  Dr. Garret Reddish  CC: Right toe pain  [id number] [name] is a 63 y.o. male coming in with complaint of right toe pain. Patient has had this pain for quite some time. Patient originally was seen at [facility] on July 23 for what seemed to be a cellulitis. Patient was treated with doxycycline with complete resolution of pain. Patient was then seen back in urgent care on December 17, 2013. Patient at that time was diagnosed with a cellulitis of the great toe. Patient was given Keflex and did not make any significant improvement but changed to doxycycline. Patient has been seen now by his primary care provider 2 times over the course last week with mild improvement with patient being on the doxycycline and did have significant improvement over the course last 2 days with the indomethacin. Patient denies any fevers, chills, or any abnormal weight loss. Patient states overall he feels like he is his normal self again at this time but is concerned because of the recurrence. X-rays were reviewed by me from urgent care visit on August 22. Patient had significant amount of soft tissue swelling and very mild osteoarthritic changes. No osseous lesion noted.    Past medical history, social, surgical and family history all reviewed in electronic medical record.   Review of Systems: No headache, visual changes, nausea, vomiting, diarrhea, constipation, dizziness, abdominal pain, skin rash, fevers, chills, night sweats, weight loss, swollen lymph nodes, body aches, joint swelling, muscle aches, chest pain, shortness of breath, mood changes.   Objective Blood pressure 136/88, pulse 58, height 6\' 1"  (1.854 m), weight 251 lb (113.853 kg), SpO2 98.00%.  General: No apparent distress alert and oriented x3 mood and affect normal, dressed appropriately.    HEENT: Pupils equal, extraocular movements intact  Respiratory: Patient's speak in full sentences and does not appear short of breath  Cardiovascular: No lower extremity edema, non tender, no erythema  Skin: Warm dry intact with no signs of infection or rash on extremities or on axial skeleton.  Abdomen: Soft nontender  Neuro: Cranial nerves II through XII are intact, neurovascularly intact in all extremities with 2+ DTRs and 2+ pulses.  Lymph: No lymphadenopathy of posterior or anterior cervical chain or axillae bilaterally.  Gait normal with good balance and coordination.  MSK:  Non tender with full range of motion and good stability and symmetric strength and tone of shoulders, elbows, wrist, hip, knee and ankles bilaterally.  Exam shows patient does have some mild erythema noted over the IP of the right toe on the first but. This is not warm to touch. Patient is nontender in exam with good range of motion. 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 Nontender over this joint as well. Patient does have a small plantar wart on the plantar aspect of the first toe.  Limited musculoskeletal ultrasound was performed and interpreted by Hulan Saas, M  Limited ultrasound of the first toe shows the patient does have a very mild double and that is potentially consistent with uric acid to position. No sign of infection noted. No foreign body noted. 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 No bone fracture noted. No soft tissue swelling appreciated.  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   Impression  and Recommendations:     This case required medical decision making of moderate complexity.

## 2013-12-27 NOTE — Patient Instructions (Addendum)
Good to meet you Stop the HCTZ/Lisinopril Start losartan. One pill daily. This will decrease uric acid and help high blood pressure.  Continue the vitamin D 5000 IU daily Calcium only 3 times a week and only daily.  Finish the doxycycline for now.  If another flare occurs try the indomethacin.  Resistance training 30 minutes 3 times weekly.   If flare occurs keep diary of food.    Gout Gout is an inflammatory arthritis caused by a buildup of uric acid crystals in the joints. Uric acid is a chemical that is normally present in the blood. When the level of uric acid in the blood is too high it can form crystals that deposit in your joints and tissues. This causes joint redness, soreness, and swelling (inflammation). Repeat attacks are common. Over time, uric acid crystals can form into masses (tophi) near a joint, destroying bone and causing disfigurement. Gout is treatable and often preventable. CAUSES  The disease begins with elevated levels of uric acid in the blood. Uric acid is produced by your body when it breaks down a naturally found substance called purines. Certain foods you eat, such as meats and fish, contain high amounts of purines. Causes of an elevated uric acid level include:  Being passed down from parent to child (heredity).  Diseases that cause increased uric acid production (such as obesity, psoriasis, and certain cancers).  Excessive alcohol use.  Diet, especially diets rich in meat and seafood.  Medicines, including certain cancer-fighting medicines (chemotherapy), water pills (diuretics), and aspirin.  Chronic kidney disease. The kidneys are no longer able to remove uric acid well.  Problems with metabolism. Conditions strongly associated with gout include:  Obesity.  High blood pressure.  High cholesterol.  Diabetes. Not everyone with elevated uric acid levels gets gout. It is not understood why some people get gout and others do not. Surgery, joint injury,  and eating too much of certain foods are some of the factors that can lead to gout attacks. SYMPTOMS   An attack of gout comes on quickly. It causes intense pain with redness, swelling, and warmth in a joint.  Fever can occur.  Often, only one joint is involved. Certain joints are more commonly involved:  Base of the big toe.  Knee.  Ankle.  Wrist.  Finger. Without treatment, an attack usually goes away in a few days to weeks. Between attacks, you usually will not have symptoms, which is different from many other forms of arthritis. DIAGNOSIS  Your caregiver will suspect gout based on your symptoms and exam. In some cases, tests may be recommended. The tests may include:  Blood tests.  Urine tests.  X-rays.  Joint fluid exam. This exam requires a needle to remove fluid from the joint (arthrocentesis). Using a microscope, gout is confirmed when uric acid crystals are seen in the joint fluid. TREATMENT  There are two phases to gout treatment: treating the sudden onset (acute) attack and preventing attacks (prophylaxis).  Treatment of an Acute Attack.  Medicines are used. These include anti-inflammatory medicines or steroid medicines.  An injection of steroid medicine into the affected joint is sometimes necessary.  The painful joint is rested. Movement can worsen the arthritis.  You may use warm or cold treatments on painful joints, depending which works best for you.  Treatment to Prevent Attacks.  If you suffer from frequent gout attacks, your caregiver may advise preventive medicine. These medicines are started after the acute attack subsides. These medicines either help your  kidneys eliminate uric acid from your body or decrease your uric acid production. You may need to stay on these medicines for a very long time.  The early phase of treatment with preventive medicine can be associated with an increase in acute gout attacks. For this reason, during the first few  months of treatment, your caregiver may also advise you to take medicines usually used for acute gout treatment. Be sure you understand your caregiver's directions. Your caregiver may make several adjustments to your medicine dose before these medicines are effective.  Discuss dietary treatment with your caregiver or dietitian. Alcohol and drinks high in sugar and fructose and foods such as meat, poultry, and seafood can increase uric acid levels. Your caregiver or dietitian can advise you on drinks and foods that should be limited. HOME CARE INSTRUCTIONS   Do not take aspirin to relieve pain. This raises uric acid levels.  Only take over-the-counter or prescription medicines for pain, discomfort, or fever as directed by your caregiver.  Rest the joint as much as possible. When in bed, keep sheets and blankets off painful areas.  Keep the affected joint raised (elevated).  Apply warm or cold treatments to painful joints. Use of warm or cold treatments depends on which works best for you.  Use crutches if the painful joint is in your leg.  Drink enough fluids to keep your urine clear or pale yellow. This helps your body get rid of uric acid. Limit alcohol, sugary drinks, and fructose drinks.  Follow your dietary instructions. Pay careful attention to the amount of protein you eat. Your daily diet should emphasize fruits, vegetables, whole grains, and fat-free or low-fat milk products. Discuss the use of coffee, vitamin C, and cherries with your caregiver or dietitian. These may be helpful in lowering uric acid levels.  Maintain a healthy body weight. SEEK MEDICAL CARE IF:   You develop diarrhea, vomiting, or any side effects from medicines.  You do not feel better in 24 hours, or you are getting worse. SEEK IMMEDIATE MEDICAL CARE IF:   Your joint becomes suddenly more tender, and you have chills or a fever. MAKE SURE YOU:   Understand these instructions.  Will watch your  condition.  Will get help right away if you are not doing well or get worse. Document Released: 04/11/2000 Document Revised: 08/29/2013 Document Reviewed: 11/26/2011 [facility] Patient Information 2015 [place], [place]. This information is not intended to replace advice given to you by your health care provider. Make sure you discuss any questions you have with your health care provider.

## 2013-12-27 NOTE — Assessment & Plan Note (Signed)
Patient does have a fairly interesting presentation. I do believe that patient actually had 2 different problems. I believe the first was a cellulitis. Patient does like working in his yard on a regular basis and they had not been wearing the proper shoe wear and did have a bug bite that allowed for potential cellulitic changes. The patient did respond very well to the doxycycline initially.  I feel that this last layer was actually more do to potential uric acid that position. 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 Patient did state once asked that he did have seafood the day before symptoms started. She does not have seafood on a regular basis. Patient's ultrasound is inconclusive but does have some findings that can be consistent with crystal arthropathy. Patient's overlying skin tissue does not have any redness at this time and it appears the patient also responded better to the indomethacin this time then the doxycycline. We discussed changes in his medications. We did change his hydrochlorothiazide/lisinopril to losartan. This will help with any excessive uric acid hopefully. Patient was given a handout for further explanation of symptoms. We discussed the possibility of repeating his uric acid when he is not having a flare and he would like to wait until he has his blood drawn for his physical exam in the month of November. We discussed if patient has any flare I would start with the indomethacin and see if he gets better. If patient has any fevers, chills, or appears to be ill in any way he will come back and see me or his primary care physician as soon as possible. Patient though today his physical findings are not significant likely due to prior treatment. Patient can followup with me more on a as-needed basis.

## 2013-12-29 ENCOUNTER — Encounter: Payer: Self-pay | Admitting: Family Medicine

## 2014-01-23 ENCOUNTER — Ambulatory Visit (INDEPENDENT_AMBULATORY_CARE_PROVIDER_SITE_OTHER): Payer: BC Managed Care – PPO | Admitting: Family Medicine

## 2014-01-23 ENCOUNTER — Encounter: Payer: Self-pay | Admitting: Family Medicine

## 2014-01-23 VITALS — BP 120/78 | Temp 98.3°F | Wt 252.0 lb

## 2014-01-23 DIAGNOSIS — M79609 Pain in unspecified limb: Secondary | ICD-10-CM

## 2014-01-23 DIAGNOSIS — M109 Gout, unspecified: Secondary | ICD-10-CM

## 2014-01-23 DIAGNOSIS — M10071 Idiopathic gout, right ankle and foot: Secondary | ICD-10-CM

## 2014-01-23 DIAGNOSIS — I10 Essential (primary) hypertension: Secondary | ICD-10-CM

## 2014-01-23 DIAGNOSIS — M79674 Pain in right toe(s): Secondary | ICD-10-CM

## 2014-01-23 MED ORDER — INDOMETHACIN 50 MG PO CAPS: 50.0000 mg | ORAL_CAPSULE | Freq: Three times a day (TID) | ORAL | Status: DC | PRN

## 2014-01-23 NOTE — Progress Notes (Signed)
Garret Reddish, MD Phone: [phone]  Subjective:   [name] is a 63 y.o. year old very pleasant male patient who presents with the following:  Gout Flare Right great IP joint started with redness and pain this morning. Feels similar to prior gout attacks. Pain rated mild to moderate aching. No radiation. No treatments tried-thinks ran out of indomethacin from last visit.  ROS- no fever/chills/nausea/vomiting. No expanding redness  Hypertension-controlled, with change from ace-i/hctz to ARB alone  BP Readings from Last 3 Encounters:  01/23/14 120/78  12/27/13 136/88  12/26/13 164/91  Compliant with medications-yes without side effects ROS-Denies any CP, HA, SOB, blurry vision, LE edema  Past Medical History- Patient Active Problem List   Diagnosis Date Noted  . Prostate cancer 06/18/2011    Priority: High  . Essential hypertension, benign 09/11/2012    Priority: Medium  . HYPERLIPIDEMIA 12/30/2006    Priority: Medium  . Left groin pain 12/24/2012    Priority: Low  . ANKLE PAIN, CHRONIC 05/01/2009    Priority: Low  . Blood in stool 10/11/2008    Priority: Low  . OSTEOPENIA 08/15/2008    Priority: Low  . VARICOSE VEINS LOWER EXTREMITIES W/INFLAMMATION 11/01/2007    Priority: Low  . COLONIC POLYPS, HX OF 12/30/2006    Priority: Low  . Gout 12/27/2013   Medications- reviewed and updated Current Outpatient Prescriptions  Medication Sig Dispense Refill  . aspirin 325 MG tablet Take 325 mg by mouth daily.      Marland Kitchen CALCIUM PO Take 1 tablet by mouth 2 (two) times daily.       . Cholecalciferol (VITAMIN D3) 2000 UNITS TABS Take 2,000 Units by mouth daily.      Marland Kitchen losartan (COZAAR) 100 MG tablet Take 1 tablet (100 mg total) by mouth daily.  90 tablet  3  . magnesium 30 MG tablet Take 30 mg by mouth 2 (two) times daily.      . niacin (SLO-NIACIN) 500 MG tablet Take 500 mg by mouth 2 (two) times daily with a meal.      . Omega-3 Fatty Acids (OMEGA-3 FISH OIL) 500 MG CAPS  Take 500 mg by mouth 2 (two) times daily.      . indomethacin (INDOCIN) 50 MG capsule Take 1 capsule (50 mg total) by mouth 3 (three) times daily with meals as needed for mild pain.  60 capsule  0  . [DISCONTINUED] Calcium-Vitamin D (CALTRATE 600 PLUS-VIT D PO) Take by mouth 2 (two) times daily.        No current facility-administered medications for this visit.    Objective: BP 120/78  Temp(Src) 98.3 F (36.8 C)  Wt 252 lb (114.306 kg) Gen: NAD, resting comfortably CV: RRR no murmurs rubs or gallops Lungs: CTAB no crackles, wheeze, rhonchi Ext: no edema in tibial area. Right great toe with erythema at IP joint and pain with joint movement, mild tenderness.  Skin: warm, dry Neuro: grossly normal, moves all extremities. Intact distal sensation.   Assessment/Plan:  Gout Gout in right great toe. Last uric acid was 6.9 and will get level when not in attack at physical. Likely need to push this below 6 given possible 3 attacks over last 2-3 months. Treat with indomethacin at present. Return precautions advised.   Essential hypertension, benign Well controlled on losartan alone.    Meds ordered this encounter  Medications  . indomethacin (INDOCIN) 50 MG capsule    Sig: Take 1 capsule (50 mg total) by mouth 3 (three)  times daily with meals as needed for mild pain.    Dispense:  60 capsule    Refill:  0

## 2014-01-23 NOTE — Assessment & Plan Note (Signed)
Well controlled on losartan alone.

## 2014-01-23 NOTE — Assessment & Plan Note (Signed)
Gout in right great toe. Last uric acid was 6.9 and will get level when not in attack at physical. Likely need to push this below 6 given possible 3 attacks over last 2-3 months. Treat with indomethacin at present. Return precautions advised.

## 2014-01-23 NOTE — Patient Instructions (Addendum)
Gout  Take Indomethacin up to 3x a day until pain improves.   In November, we will check your uric acid level and consider allopurinol  If no improvement within a week or if symptoms significantly worsen, please come back to see [place]  Gout Gout is an inflammatory arthritis caused by a buildup of uric acid crystals in the joints. Uric acid is a chemical that is normally present in the blood. When the level of uric acid in the blood is too high it can form crystals that deposit in your joints and tissues. This causes joint redness, soreness, and swelling (inflammation). Repeat attacks are common. Over time, uric acid crystals can form into masses (tophi) near a joint, destroying bone and causing disfigurement. Gout is treatable and often preventable. CAUSES  The disease begins with elevated levels of uric acid in the blood. Uric acid is produced by your body when it breaks down a naturally found substance called purines. Certain foods you eat, such as meats and fish, contain high amounts of purines. Causes of an elevated uric acid level include:  Being passed down from parent to child (heredity).  Diseases that cause increased uric acid production (such as obesity, psoriasis, and certain cancers).  Excessive alcohol use.  Diet, especially diets rich in meat and seafood.  Medicines, including certain cancer-fighting medicines (chemotherapy), water pills (diuretics), and aspirin.  Chronic kidney disease. The kidneys are no longer able to remove uric acid well.  Problems with metabolism. Conditions strongly associated with gout include:  Obesity.  High blood pressure.  High cholesterol.  Diabetes. Not everyone with elevated uric acid levels gets gout. It is not understood why some people get gout and others do not. Surgery, joint injury, and eating too much of certain foods are some of the factors that can lead to gout attacks. SYMPTOMS   An attack of gout comes on quickly. It causes  intense pain with redness, swelling, and warmth in a joint.  Fever can occur.  Often, only one joint is involved. Certain joints are more commonly involved:  Base of the big toe.  Knee.  Ankle.  Wrist.  Finger. Without treatment, an attack usually goes away in a few days to weeks. Between attacks, you usually will not have symptoms, which is different from many other forms of arthritis. DIAGNOSIS  Your caregiver will suspect gout based on your symptoms and exam. In some cases, tests may be recommended. The tests may include:  Blood tests.  Urine tests.  X-rays.  Joint fluid exam. This exam requires a needle to remove fluid from the joint (arthrocentesis). Using a microscope, gout is confirmed when uric acid crystals are seen in the joint fluid. TREATMENT  There are two phases to gout treatment: treating the sudden onset (acute) attack and preventing attacks (prophylaxis).  Treatment of an Acute Attack.  Medicines are used. These include anti-inflammatory medicines or steroid medicines.  An injection of steroid medicine into the affected joint is sometimes necessary.  The painful joint is rested. Movement can worsen the arthritis.  You may use warm or cold treatments on painful joints, depending which works best for you.  Treatment to Prevent Attacks.  If you suffer from frequent gout attacks, your caregiver may advise preventive medicine. These medicines are started after the acute attack subsides. These medicines either help your kidneys eliminate uric acid from your body or decrease your uric acid production. You may need to stay on these medicines for a very long time.  The  early phase of treatment with preventive medicine can be associated with an increase in acute gout attacks. For this reason, during the first few months of treatment, your caregiver may also advise you to take medicines usually used for acute gout treatment. Be sure you understand your caregiver's  directions. Your caregiver may make several adjustments to your medicine dose before these medicines are effective.  Discuss dietary treatment with your caregiver or dietitian. Alcohol and drinks high in sugar and fructose and foods such as meat, poultry, and seafood can increase uric acid levels. Your caregiver or dietitian can advise you on drinks and foods that should be limited. HOME CARE INSTRUCTIONS   Do not take aspirin to relieve pain. This raises uric acid levels.  Only take over-the-counter or prescription medicines for pain, discomfort, or fever as directed by your caregiver.  Rest the joint as much as possible. When in bed, keep sheets and blankets off painful areas.  Keep the affected joint raised (elevated).  Apply warm or cold treatments to painful joints. Use of warm or cold treatments depends on which works best for you.  Use crutches if the painful joint is in your leg.  Drink enough fluids to keep your urine clear or pale yellow. This helps your body get rid of uric acid. Limit alcohol, sugary drinks, and fructose drinks.  Follow your dietary instructions. Pay careful attention to the amount of protein you eat. Your daily diet should emphasize fruits, vegetables, whole grains, and fat-free or low-fat milk products. Discuss the use of coffee, vitamin C, and cherries with your caregiver or dietitian. These may be helpful in lowering uric acid levels.  Maintain a healthy body weight. SEEK MEDICAL CARE IF:   You develop diarrhea, vomiting, or any side effects from medicines.  You do not feel better in 24 hours, or you are getting worse. SEEK IMMEDIATE MEDICAL CARE IF:   Your joint becomes suddenly more tender, and you have chills or a fever. MAKE SURE YOU:   Understand these instructions.  Will watch your condition.  Will get help right away if you are not doing well or get worse. Document Released: 04/11/2000 Document Revised: 08/29/2013 Document Reviewed:  11/26/2011 [facility] Patient Information 2015 [place], [place]. This information is not intended to replace advice given to you by your health care provider. Make sure you discuss any questions you have with your health care provider.

## 2014-02-09 ENCOUNTER — Encounter: Payer: Self-pay | Admitting: Gastroenterology

## 2014-03-09 ENCOUNTER — Telehealth: Payer: Self-pay | Admitting: Internal Medicine

## 2014-03-09 DIAGNOSIS — C61 Malignant neoplasm of prostate: Secondary | ICD-10-CM

## 2014-03-09 DIAGNOSIS — M10071 Idiopathic gout, right ankle and foot: Secondary | ICD-10-CM

## 2014-03-09 DIAGNOSIS — R7309 Other abnormal glucose: Secondary | ICD-10-CM

## 2014-03-09 DIAGNOSIS — E785 Hyperlipidemia, unspecified: Secondary | ICD-10-CM

## 2014-03-09 DIAGNOSIS — I159 Secondary hypertension, unspecified: Secondary | ICD-10-CM

## 2014-03-09 DIAGNOSIS — I10 Essential (primary) hypertension: Secondary | ICD-10-CM

## 2014-03-09 NOTE — Telephone Encounter (Signed)
CBC, CMET, UA under HTN TSH, Lipid panel under HLD PSA under prostate cancer a1c under elevated a1c Uric acid under gout

## 2014-03-09 NOTE — Telephone Encounter (Signed)
What labs need to be ordered 

## 2014-03-09 NOTE — Telephone Encounter (Signed)
Pt would like to go to [facility] on 03-13-14 for cpx labs. Please put order in system if its ok

## 2014-03-10 NOTE — Telephone Encounter (Signed)
Lab orders entered in for [name] office

## 2014-03-11 ENCOUNTER — Ambulatory Visit (INDEPENDENT_AMBULATORY_CARE_PROVIDER_SITE_OTHER): Payer: BC Managed Care – PPO | Admitting: Family Medicine

## 2014-03-11 VITALS — BP 140/90 | HR 71 | Temp 98.3°F | Resp 16 | Ht 72.5 in | Wt 246.0 lb

## 2014-03-11 DIAGNOSIS — S60459A Superficial foreign body of unspecified finger, initial encounter: Secondary | ICD-10-CM

## 2014-03-11 DIAGNOSIS — S60450A Superficial foreign body of right index finger, initial encounter: Secondary | ICD-10-CM

## 2014-03-11 DIAGNOSIS — Z23 Encounter for immunization: Secondary | ICD-10-CM

## 2014-03-11 MED ORDER — CEPHALEXIN 500 MG PO CAPS
500.0000 mg | ORAL_CAPSULE | Freq: Four times a day (QID) | ORAL | Status: DC
Start: 2014-03-11 — End: 2014-03-20

## 2014-03-11 NOTE — Patient Instructions (Addendum)
If the finger starts looking infected go ahead and take the Keflex (cephalexin) 13 times daily  TDAP is being given you today.  Return as necessary.

## 2014-03-11 NOTE — Progress Notes (Signed)
  Subjective: 63 year old man who was doing yard work and got a splinter under his right index fingernail. He try to get it out unsuccessfully. He cleaned it with peroxide. He came on over to the office to see if we could get the splinter out.  Objective: Approximate 3 mm long splinter underneath the middle of the right index fingernail. This was extracted using the alligator forceps. Patient tolerated it well. It was able to come out easily.  Assessment: Foreign body right index finger  Plan: His last tetanus shot was 8 years ago, a TD. Advised him to go ahead and take the TDAP And he was agreeable.  Do not think this needs any antibiotics, and probably will heal up unremarkably. However if it starts looking infected he is to go ahead and begin on Keflex 500 mg 3 times daily.  Return if problems.

## 2014-03-13 ENCOUNTER — Encounter: Payer: Self-pay | Admitting: Family Medicine

## 2014-03-13 ENCOUNTER — Other Ambulatory Visit (INDEPENDENT_AMBULATORY_CARE_PROVIDER_SITE_OTHER): Payer: BC Managed Care – PPO

## 2014-03-13 ENCOUNTER — Other Ambulatory Visit: Payer: BC Managed Care – PPO

## 2014-03-13 DIAGNOSIS — E785 Hyperlipidemia, unspecified: Secondary | ICD-10-CM

## 2014-03-13 DIAGNOSIS — I10 Essential (primary) hypertension: Secondary | ICD-10-CM

## 2014-03-13 DIAGNOSIS — R7309 Other abnormal glucose: Secondary | ICD-10-CM

## 2014-03-13 DIAGNOSIS — M10071 Idiopathic gout, right ankle and foot: Secondary | ICD-10-CM

## 2014-03-13 DIAGNOSIS — I159 Secondary hypertension, unspecified: Secondary | ICD-10-CM

## 2014-03-13 DIAGNOSIS — C61 Malignant neoplasm of prostate: Secondary | ICD-10-CM

## 2014-03-13 LAB — LIPID PANEL
CHOL/HDL RATIO: 6
Cholesterol: 198 mg/dL (ref 0–200)
HDL: 34.3 mg/dL — ABNORMAL LOW (ref >39.00)
LDL Cholesterol: 146 mg/dL — ABNORMAL HIGH (ref 0–99)
NONHDL: 163.7
Triglycerides: 87 mg/dL (ref 0.0–149.0)
VLDL: 17.4 mg/dL (ref 0.0–40.0)

## 2014-03-13 LAB — POCT URINALYSIS DIPSTICK
Bilirubin, UA: NEGATIVE
Blood, UA: NEGATIVE
Glucose, UA: NEGATIVE
Ketones, UA: NEGATIVE
Leukocytes, UA: NEGATIVE
NITRITE UA: NEGATIVE
PH UA: 5.5
Protein, UA: NEGATIVE
SPEC GRAV UA: 1.02
Urobilinogen, UA: 0.2

## 2014-03-13 LAB — CBC
HCT: 48.8 % (ref 39.0–52.0)
Hemoglobin: 16 g/dL (ref 13.0–17.0)
MCHC: 32.8 g/dL (ref 30.0–36.0)
MCV: 86 fl (ref 78.0–100.0)
Platelets: 226 10*3/uL (ref 150.0–400.0)
RBC: 5.67 Mil/uL (ref 4.22–5.81)
RDW: 14.7 % (ref 11.5–15.5)
WBC: 7 10*3/uL (ref 4.0–10.5)

## 2014-03-13 LAB — COMPREHENSIVE METABOLIC PANEL
ALK PHOS: 49 U/L (ref 39–117)
ALT: 20 U/L (ref 0–53)
AST: 21 U/L (ref 0–37)
Albumin: 3.8 g/dL (ref 3.5–5.2)
BILIRUBIN TOTAL: 0.7 mg/dL (ref 0.2–1.2)
BUN: 16 mg/dL (ref 6–23)
CHLORIDE: 105 meq/L (ref 96–112)
CO2: 30 mEq/L (ref 19–32)
Calcium: 9.3 mg/dL (ref 8.4–10.5)
Creatinine, Ser: 0.9 mg/dL (ref 0.4–1.5)
GFR: 87.8 mL/min (ref >60.00)
Glucose, Bld: 95 mg/dL (ref 70–99)
Potassium: 5.1 mEq/L (ref 3.5–5.1)
Sodium: 141 mEq/L (ref 135–145)
Total Protein: 6.8 g/dL (ref 6.0–8.3)

## 2014-03-13 LAB — HEMOGLOBIN A1C: Hgb A1c MFr Bld: 5.6 % (ref 4.6–6.5)

## 2014-03-13 LAB — TSH: TSH: 1.12 u[IU]/mL (ref 0.35–4.50)

## 2014-03-13 LAB — URIC ACID: URIC ACID, SERUM: 7.4 mg/dL (ref 4.0–7.8)

## 2014-03-13 LAB — PSA: PSA: 0.01 ng/mL — AB (ref 0.10–4.00)

## 2014-03-20 ENCOUNTER — Encounter: Payer: BC Managed Care – PPO | Admitting: Internal Medicine

## 2014-03-20 ENCOUNTER — Ambulatory Visit (INDEPENDENT_AMBULATORY_CARE_PROVIDER_SITE_OTHER): Payer: BC Managed Care – PPO | Admitting: Family Medicine

## 2014-03-20 ENCOUNTER — Encounter: Payer: Self-pay | Admitting: Family Medicine

## 2014-03-20 ENCOUNTER — Ambulatory Visit (INDEPENDENT_AMBULATORY_CARE_PROVIDER_SITE_OTHER): Payer: BC Managed Care – PPO

## 2014-03-20 VITALS — BP 138/82 | HR 86 | Temp 98.3°F | Wt 249.0 lb

## 2014-03-20 DIAGNOSIS — E785 Hyperlipidemia, unspecified: Secondary | ICD-10-CM

## 2014-03-20 DIAGNOSIS — Z23 Encounter for immunization: Secondary | ICD-10-CM

## 2014-03-20 DIAGNOSIS — C61 Malignant neoplasm of prostate: Secondary | ICD-10-CM

## 2014-03-20 DIAGNOSIS — IMO0001 Reserved for inherently not codable concepts without codable children: Secondary | ICD-10-CM

## 2014-03-20 DIAGNOSIS — Z0000 Encounter for general adult medical examination without abnormal findings: Secondary | ICD-10-CM

## 2014-03-20 DIAGNOSIS — M10071 Idiopathic gout, right ankle and foot: Secondary | ICD-10-CM

## 2014-03-20 NOTE — Assessment & Plan Note (Signed)
10 year risk >15%. Recommended statin but patient concerned about side effects. Wants to work on diet/exercise and reconsider in 6 months.

## 2014-03-20 NOTE — Progress Notes (Signed)
Roberto Reddish, MD Phone: [phone]  Subjective:  Patient presents today for annual physical. Chief complaint-noted.   Osteopenia Patient was taking calcium-vit D daily and now changed to 3x a week. Taking 5000 units Vit D.  Recommend 800 IU instead and agree with Vit D  Patient wants to take asa at night and drink more water.   Eating a lot of nuts, salmon, broccoli and not able to provoke attack Monitor for recurrent potential gout.   Advised patient to get a colonoscopy  Wants to continue to work on weight loss for hernia as well as cholesterol.   Rectal deferred with history of prostatectomy.   Plans to cut carbs 1/2 hour every day on ellipitical 7 days.   ROS- Full ROS completed and negative   The following were reviewed and entered/updated in epic: Past Medical History  Diagnosis Date  . Obesity   . Hyperlipidemia   . Allergy   . Diverticulosis   . Hemorrhoids   . Hx of adenomatous colonic polyps   . DVT (deep venous thrombosis)     1990s  . 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 Rhinitis   . Prostate cancer   . Hypertension   . DIVERTICULOSIS, COLON 12/30/2006    Qualifier: Diagnosis of  By: Rogue Bussing CMA, Jacqualynn     Patient Active Problem List   Diagnosis Date Noted  . Prostate cancer 06/18/2011    Priority: High  . Gout 12/27/2013    Priority: Medium  . Essential hypertension, benign 09/11/2012    Priority: Medium  . HYPERLIPIDEMIA 12/30/2006    Priority: Medium  . Left groin pain 12/24/2012    Priority: Low  . ANKLE PAIN, CHRONIC 05/01/2009    Priority: Low  . Blood in stool 10/11/2008    Priority: Low  . OSTEOPENIA 08/15/2008    Priority: Low  . VARICOSE VEINS LOWER EXTREMITIES W/INFLAMMATION 11/01/2007    Priority: Low  . COLONIC POLYPS, HX OF 12/30/2006    Priority: Low  . DVT (deep venous thrombosis)    Past Surgical History  Procedure Laterality Date  . Eye surgery    . Fx l tibia,fibula with  external fixator    . Remove fixator    . Varicocele repair    . Bilateral vein ligation    . Prostatectomy  2013    Open radical prostatecomy  . Fracture surgery      Family History  Problem Relation Age of Onset  . Diabetes Father   . Coronary artery disease Maternal Grandfather     67 never smoker  . Colon polyps Father   . Stroke Father     48 former smoker  . Stroke Maternal Grandfather   . Cancer Paternal Grandmother     pancreatic, breast  . Transient ischemic attack Mother     94, 45    Medications- reviewed and updated Current Outpatient Prescriptions  Medication Sig Dispense Refill  . aspirin 325 MG tablet Take 325 mg by mouth daily.    Marland Kitchen CALCIUM PO Take 1 tablet by mouth 2 (two) times daily.     . Cholecalciferol (VITAMIN D3) 2000 UNITS TABS Take 5,000 Units by mouth daily.     Marland Kitchen losartan (COZAAR) 100 MG tablet Take 1 tablet (100 mg total) by mouth daily. 90 tablet 3  . magnesium 30 MG tablet Take 30 mg by mouth 2 (two) times daily.    . niacin (SLO-NIACIN) 500 MG tablet Take 500 mg by mouth 2 (  two) times daily with a meal.    . Omega-3 Fatty Acids (OMEGA-3 FISH OIL) 500 MG CAPS Take 500 mg by mouth 2 (two) times daily.    . indomethacin (INDOCIN) 50 MG capsule Take 1 capsule (50 mg total) by mouth 3 (three) times daily with meals as needed for mild pain. (Patient not taking: Reported on 03/20/2014) 60 capsule 0  . [DISCONTINUED] Calcium-Vitamin D (CALTRATE 600 PLUS-VIT D PO) Take by mouth 2 (two) times daily.      No current facility-administered medications for this visit.    Allergies-reviewed and updated Allergies  Allergen Reactions  . Adhesive [Tape]     Irritation.  . Fentanyl     REACTION: N/V  . Latex     Irritation at site.  . Levofloxacin     Light headed    History   Social History  . Marital Status: Married    Spouse Name: N/A    Number of Children: N/A  . Years of Education: N/A   Occupational History  . retired    Social  History Main Topics  . Smoking status: Never Smoker   . Smokeless tobacco: Never Used  . Alcohol Use: Yes     Comment: occasional  . Drug Use: Yes    Special: Other-see comments  . Sexual Activity: None   Other Topics Concern  . None   Social History Narrative   Married. No kids. No pets.       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 Oropharynx normal. Good dentition. TM obscured by dry wax.  CV: RRR no murmurs rubs or gallops Lungs: CTAB no crackles, wheeze, rhonchi Abdomen: soft/nontender/nondistended/normal bowel sounds. No rebound or guarding.  Ext: no edema Skin: warm, dry, no rash Neuro: grossly normal, moves all extremities, PERRLA   Assessment/Plan:  63 y.o. male presenting for annual physical.  Health Maintenance counseling: 1. Anticipatory guidance: Patient counseled regarding regular dental exams, wearing seatbelts, wearing sunscreen.  2. Risk factor reduction:  Advised patient of need for regular exercise and diet rich and fruits and vegetables to reduce risk of heart attack and stroke.  3. Immunizations/screenings/ancillary studies -up to date. Recommended patient to follow through with colonoscopy.   See AVS for additional comments  Gout Diagnosis not clear at this point. Continue to monitor. If recurrence consider uric acid lowering medication. If cellulitis concern-consider sending for discussion about varicose veins with current provider.   Hyperlipidemia 10 year risk >15%. Recommended statin but patient concerned about side effects. Wants to work on diet/exercise and reconsider in 6 months.    F/u 6 months- gout symptoms, psa, weight loss goal of 1/2 lb to 1 lb per week. Recheck cholesterol

## 2014-03-20 NOTE — Assessment & Plan Note (Signed)
Diagnosis not clear at this point. Continue to monitor. If recurrence consider uric acid lowering medication. If cellulitis concern-consider sending for discussion about varicose veins with current provider.

## 2014-03-20 NOTE — Patient Instructions (Addendum)
63 y.o. male presenting for annual physical.  Health Maintenance counseling: 1. Anticipatory guidance: Patient counseled regarding regular dental exams, wearing seatbelts, wearing sunscreen.  2. Risk factor reduction:  Advised patient of need for regular exercise and diet rich and fruits and vegetables to reduce risk of heart attack and stroke.  3. Immunizations/screenings/ancillary studies -up to date. Recommended patient to follow through with colonoscopy.   ASA at night is fine.   Recommend 800 IU Vit D  See each other in 6 months-consider uric acid. Check PSA and cholesterol. Come in fasting.   You set a goal to work on weight loss. Let's target 1/2 to 1 lb per week. Focus on vegetables and fruits with goal of at least 5 servings per day. Work on portion size.

## 2014-04-06 ENCOUNTER — Other Ambulatory Visit: Payer: Self-pay | Admitting: Family Medicine

## 2014-04-06 MED ORDER — LOSARTAN POTASSIUM 100 MG PO TABS: 100.0000 mg | ORAL_TABLET | Freq: Every day | ORAL | Status: DC

## 2014-04-06 NOTE — Telephone Encounter (Signed)
Sent to the pharmacy by e-scribe. Pt seen in Nov 2015 and asked to return in 6 months.  Pt has a upcoming scheduled appt on 09/18/14.

## 2014-05-01 DIAGNOSIS — C44319 Basal cell carcinoma of skin of other parts of face: Secondary | ICD-10-CM | POA: Diagnosis not present

## 2014-05-01 DIAGNOSIS — X32XXXD Exposure to sunlight, subsequent encounter: Secondary | ICD-10-CM | POA: Diagnosis not present

## 2014-05-01 DIAGNOSIS — I8310 Varicose veins of unspecified lower extremity with inflammation: Secondary | ICD-10-CM | POA: Diagnosis not present

## 2014-05-01 DIAGNOSIS — D1801 Hemangioma of skin and subcutaneous tissue: Secondary | ICD-10-CM | POA: Diagnosis not present

## 2014-05-01 DIAGNOSIS — L570 Actinic keratosis: Secondary | ICD-10-CM | POA: Diagnosis not present

## 2014-05-01 DIAGNOSIS — L821 Other seborrheic keratosis: Secondary | ICD-10-CM | POA: Diagnosis not present

## 2014-05-01 DIAGNOSIS — C4441 Basal cell carcinoma of skin of scalp and neck: Secondary | ICD-10-CM | POA: Diagnosis not present

## 2014-05-01 DIAGNOSIS — D225 Melanocytic nevi of trunk: Secondary | ICD-10-CM | POA: Diagnosis not present

## 2014-05-31 DIAGNOSIS — L739 Follicular disorder, unspecified: Secondary | ICD-10-CM | POA: Diagnosis not present

## 2014-05-31 DIAGNOSIS — I8310 Varicose veins of unspecified lower extremity with inflammation: Secondary | ICD-10-CM | POA: Diagnosis not present

## 2014-05-31 DIAGNOSIS — Z08 Encounter for follow-up examination after completed treatment for malignant neoplasm: Secondary | ICD-10-CM | POA: Diagnosis not present

## 2014-05-31 DIAGNOSIS — Z85828 Personal history of other malignant neoplasm of skin: Secondary | ICD-10-CM | POA: Diagnosis not present

## 2014-06-07 DIAGNOSIS — H10502 Unspecified blepharoconjunctivitis, left eye: Secondary | ICD-10-CM | POA: Diagnosis not present

## 2014-07-02 ENCOUNTER — Ambulatory Visit (INDEPENDENT_AMBULATORY_CARE_PROVIDER_SITE_OTHER): Payer: Medicare Other | Admitting: Family Medicine

## 2014-07-02 VITALS — BP 150/88 | HR 87 | Temp 98.2°F | Resp 16 | Ht 73.0 in | Wt 251.8 lb

## 2014-07-02 DIAGNOSIS — J029 Acute pharyngitis, unspecified: Secondary | ICD-10-CM

## 2014-07-02 DIAGNOSIS — R51 Headache: Secondary | ICD-10-CM | POA: Diagnosis not present

## 2014-07-02 DIAGNOSIS — R519 Headache, unspecified: Secondary | ICD-10-CM

## 2014-07-02 LAB — POCT RAPID STREP A (OFFICE): Rapid Strep A Screen: NEGATIVE

## 2014-07-02 MED ORDER — AMOXICILLIN 875 MG PO TABS: 875.0000 mg | ORAL_TABLET | Freq: Two times a day (BID) | ORAL | Status: DC

## 2014-07-02 NOTE — Patient Instructions (Signed)
Results for orders placed or performed in visit on 03/13/14  CBC (no diff)  Result Value Ref Range   WBC 7.0 4.0 - 10.5 K/uL   RBC 5.67 4.22 - 5.81 Mil/uL   Platelets 226.0 150.0 - 400.0 K/uL   Hemoglobin 16.0 13.0 - 17.0 g/dL   HCT 48.8 39.0 - 52.0 %   MCV 86.0 78.0 - 100.0 fl   MCHC 32.8 30.0 - 36.0 g/dL   RDW 14.7 11.5 - 15.5 %  Uric Acid  Result Value Ref Range   Uric Acid, Serum 7.4 4.0 - 7.8 mg/dL  Hemoglobin A1c  Result Value Ref Range   Hgb A1c MFr Bld 5.6 4.6 - 6.5 %  TSH  Result Value Ref Range   TSH 1.12 0.35 - 4.50 uIU/mL  PSA  Result Value Ref Range   PSA 0.01 (L) 0.10 - 4.00 ng/mL  Lipid panel  Result Value Ref Range   Cholesterol 198 0 - 200 mg/dL   Triglycerides 87.0 0.0 - 149.0 mg/dL   HDL 34.30 (L) >39.00 mg/dL   VLDL 17.4 0.0 - 40.0 mg/dL   LDL Cholesterol 146 (H) 0 - 99 mg/dL   Total CHOL/HDL Ratio 6    NonHDL 163.70   Comprehensive metabolic panel  Result Value Ref Range   Sodium 141 135 - 145 mEq/L   Potassium 5.1 3.5 - 5.1 mEq/L   Chloride 105 96 - 112 mEq/L   CO2 30 19 - 32 mEq/L   Glucose, Bld 95 70 - 99 mg/dL   BUN 16 6 - 23 mg/dL   Creatinine, Ser 0.9 0.4 - 1.5 mg/dL   Total Bilirubin 0.7 0.2 - 1.2 mg/dL   Alkaline Phosphatase 49 39 - 117 U/L   AST 21 0 - 37 U/L   ALT 20 0 - 53 U/L   Total Protein 6.8 6.0 - 8.3 g/dL   Albumin 3.8 3.5 - 5.2 g/dL   Calcium 9.3 8.4 - 10.5 mg/dL   GFR 87.80 >60.00 mL/min  POC Urinalysis Dipstick  Result Value Ref Range   Color, UA yellow    Clarity, UA clear    Glucose, UA n    Bilirubin, UA n    Ketones, UA n    Spec Grav, UA 1.020    Blood, UA n    pH, UA 5.5    Protein, UA n    Urobilinogen, UA 0.2    Nitrite, UA n    Leukocytes, UA Negative

## 2014-07-02 NOTE — Progress Notes (Addendum)
° °  Subjective:    Patient ID: [name], male    DOB: 06/28/1950, 64 y.o.   MRN: [id number] This chart was scribed for Roberto Haber, MD by Zola Button, Medical Scribe. This patient was seen in Room 14 and the patient's care was started at 9:31 AM.   HPI HPI Comments: [name] is a 64 y.o. male who presents to the [facility] complaining of gradual onset sore throat that started this morning around 2:30 AM. Patient reports that he felt like he was about become ill last night, so he took some vitamin C. He then woke up around 2:30 AM with a moderate to severe sore throat. He had difficultly sleeping afterwards, but he states that patient normally has some trouble sleeping. Patient also reports having some sinus pressure.   Patient is retired. He used to work for [employer].  Review of Systems  HENT: Positive for sinus pressure and sore throat.        Objective:   Physical Exam CONSTITUTIONAL: Well developed/well nourished HEAD: Normocephalic/atraumatic EYES: EOM/PERRL ENMT: Mucous membranes moist, very red posterior pharynx NECK: supple no meningeal signs SPINE: entire spine nontender CV: S1/S2 noted, no murmurs/rubs/gallops noted LUNGS: Lungs are clear to auscultation bilaterally, no apparent distress ABDOMEN: soft, nontender, no rebound or guarding GU: no cva tenderness NEURO: Pt is awake/alert, moves all extremitiesx4 EXTREMITIES: pulses normal, full ROM SKIN: warm, color normal PSYCH: no abnormalities of mood noted  Results for orders placed or performed in visit on 07/02/14  POCT rapid strep A  Result Value Ref Range   Rapid Strep A Screen Negative Negative   Results for orders placed or performed in visit on 07/02/14  POCT rapid strep A  Result Value Ref Range   Rapid Strep A Screen Negative Negative        Assessment & Plan:   This chart was scribed in my presence and reviewed by me personally.    ICD-9-CM ICD-10-CM   1. Sore  throat 462 J02.9 POCT rapid strep A     Throat culture ([facility])     amoxicillin (AMOXIL) 875 MG tablet  2. Facial pain 784.0 R51 amoxicillin (AMOXIL) 875 MG tablet     Signed, Roberto Haber, MD

## 2014-07-03 LAB — CULTURE, GROUP A STREP: Organism ID, Bacteria: NORMAL

## 2014-07-05 ENCOUNTER — Encounter: Payer: Self-pay | Admitting: Family Medicine

## 2014-07-27 ENCOUNTER — Ambulatory Visit (INDEPENDENT_AMBULATORY_CARE_PROVIDER_SITE_OTHER): Payer: Medicare Other | Admitting: Family Medicine

## 2014-07-27 ENCOUNTER — Encounter: Payer: Self-pay | Admitting: Family Medicine

## 2014-07-27 VITALS — BP 130/82 | HR 88 | Temp 98.7°F | Wt 247.0 lb

## 2014-07-27 DIAGNOSIS — Z23 Encounter for immunization: Secondary | ICD-10-CM | POA: Diagnosis not present

## 2014-07-27 DIAGNOSIS — R059 Cough, unspecified: Secondary | ICD-10-CM

## 2014-07-27 DIAGNOSIS — J069 Acute upper respiratory infection, unspecified: Secondary | ICD-10-CM | POA: Diagnosis not present

## 2014-07-27 DIAGNOSIS — R05 Cough: Secondary | ICD-10-CM

## 2014-07-27 MED ORDER — LOSARTAN POTASSIUM 100 MG PO TABS: 100.0000 mg | ORAL_TABLET | Freq: Every day | ORAL | Status: DC

## 2014-07-27 MED ORDER — HYDROCODONE-HOMATROPINE 5-1.5 MG/5ML PO SYRP: 5.0000 mL | ORAL_SOLUTION | Freq: Three times a day (TID) | ORAL | Status: DC | PRN

## 2014-07-27 NOTE — Patient Instructions (Addendum)
Received [id number] today.  Return to see me if: you have improvement and then worsen, you continue to worsen into Monday, sooner if you have fevers or shortness of breath. Symptoms persist past 2 weeks.   We unfortunately do not have treatments for viruses like this.   Try a stronger cough syrup. No driving for 8 hours after dose   Upper Respiratory Infection, Adult An upper respiratory infection (URI) is also sometimes known as the common cold. The upper respiratory tract includes the nose, sinuses, throat, trachea, and bronchi. Bronchi are the airways leading to the lungs. Most people improve within 1 week, but symptoms can last up to 2 weeks. A residual cough may last even longer.  CAUSES Many different viruses can infect the tissues lining the upper respiratory tract. The tissues become irritated and inflamed and often become very moist. Mucus production is also common. A cold is contagious. You can easily spread the virus to others by oral contact. This includes kissing, sharing a glass, coughing, or sneezing. Touching your mouth or nose and then touching a surface, which is then touched by another person, can also spread the virus. SYMPTOMS  Symptoms typically develop 1 to 3 days after you come in contact with a cold virus. Symptoms vary from person to person. They may include:  Runny nose.  Sneezing.  Nasal congestion.  Sinus irritation.  Sore throat.  Loss of voice (laryngitis).  Cough.  Fatigue.  Muscle aches.  Loss of appetite.  Headache.  Low-grade fever. DIAGNOSIS  You might diagnose your own cold based on familiar symptoms, since most people get a cold 2 to 3 times a year. Your caregiver can confirm this based on your exam. Most importantly, your caregiver can check that your symptoms are not due to another disease such as strep throat, sinusitis, pneumonia, asthma, or epiglottitis. Blood tests, throat tests, and X-rays are not necessary to diagnose a common cold,  but they may sometimes be helpful in excluding other more serious diseases. Your caregiver will decide if any further tests are required. RISKS AND COMPLICATIONS  You may be at risk for a more severe case of the common cold if you smoke cigarettes, have chronic heart disease (such as heart failure) or lung disease (such as asthma), or if you have a weakened immune system. The very young and very old are also at risk for more serious infections. Bacterial sinusitis, middle ear infections, and bacterial pneumonia can complicate the common cold. The common cold can worsen asthma and chronic obstructive pulmonary disease (COPD). Sometimes, these complications can require emergency medical care and may be life-threatening. PREVENTION  The best way to protect against getting a cold is to practice good hygiene. Avoid oral or hand contact with people with cold symptoms. Wash your hands often if contact occurs. There is no clear evidence that vitamin C, vitamin E, echinacea, or exercise reduces the chance of developing a cold. However, it is always recommended to get plenty of rest and practice good nutrition. TREATMENT  Treatment is directed at relieving symptoms. There is no cure. Antibiotics are not effective, because the infection is caused by a virus, not by bacteria. Treatment may include:  Increased fluid intake. Sports drinks offer valuable electrolytes, sugars, and fluids.  Breathing heated mist or steam (vaporizer or shower).  Eating chicken soup or other clear broths, and maintaining good nutrition.  Getting plenty of rest.  Using gargles or lozenges for comfort.  Controlling fevers with ibuprofen or acetaminophen as directed by  your caregiver.  Increasing usage of your inhaler if you have asthma. Zinc gel and zinc lozenges, taken in the first 24 hours of the common cold, can shorten the duration and lessen the severity of symptoms. Pain medicines may help with fever, muscle aches, and throat  pain. A variety of non-prescription medicines are available to treat congestion and runny nose. Your caregiver can make recommendations and may suggest nasal or lung inhalers for other symptoms.  HOME CARE INSTRUCTIONS   Only take over-the-counter or prescription medicines for pain, discomfort, or fever as directed by your caregiver.  Use a warm mist humidifier or inhale steam from a shower to increase air moisture. This may keep secretions moist and make it easier to breathe.  Drink enough water and fluids to keep your urine clear or pale yellow.  Rest as needed.  Return to work when your temperature has returned to normal or as your caregiver advises. You may need to stay home longer to avoid infecting others. You can also use a face mask and careful hand washing to prevent spread of the virus. SEEK MEDICAL CARE IF:   After the first few days, you feel you are getting worse rather than better.  You need your caregiver's advice about medicines to control symptoms.  You develop chills, worsening shortness of breath, or brown or red sputum. These may be signs of pneumonia.  You develop yellow or brown nasal discharge or pain in the face, especially when you bend forward. These may be signs of sinusitis.  You develop a fever, swollen neck glands, pain with swallowing, or white areas in the back of your throat. These may be signs of strep throat. SEEK IMMEDIATE MEDICAL CARE IF:   You have a fever.  You develop severe or persistent headache, ear pain, sinus pain, or chest pain.  You develop wheezing, a prolonged cough, cough up blood, or have a change in your usual mucus (if you have chronic lung disease).  You develop sore muscles or a stiff neck. Document Released: 10/08/2000 Document Revised: 07/07/2011 Document Reviewed: 07/20/2013 [facility] Patient Information 2015 [place], [place]. This information is not intended to replace advice given to you by your health care provider. Make sure  you discuss any questions you have with your health care provider.

## 2014-07-27 NOTE — Progress Notes (Signed)
  Garret Reddish, MD Phone: [phone]  Subjective:  [name] is a 64 y.o. year old very pleasant male patient who presents with Upper Respiratory infection   Cough -started 07/02/14 and seen by [place] urgent [facility]. Treated with amoxicillin. Culture and rapid strep came back negative. ? If treated for bacterial sinusitis. Felt at least 75% better but never completely felt back to normal  Then he developed a Dry cough  2 days ago. Codeine cough syrup with mucinex helped slightly. Achy all over. Feels congested but no nasal drainage. No sore throat. Symptoms are stable to worsening.   Wife is sick and has been seen and possible "touch of pneumonia". No other treatments tried.   Some clear runny nose. Sneezing. No watery/itchy eyes.   ROS- no shortness of breath or chest pain. No fever/chills. No headache. No facial pain or sinus tenderness  Pertinent Past Medical History- prostate cancer, HLD, HTn, gout  Medications- reviewed  Current Outpatient Prescriptions  Medication Sig Dispense Refill  . aspirin 325 MG tablet Take 325 mg by mouth daily.    Marland Kitchen CALCIUM PO Take 1 tablet by mouth 2 (two) times daily.     . Cholecalciferol (VITAMIN D3) 2000 UNITS TABS Take 5,000 Units by mouth daily.     Marland Kitchen losartan (COZAAR) 100 MG tablet Take 1 tablet (100 mg total) by mouth daily. 90 tablet 3  . magnesium 30 MG tablet Take 30 mg by mouth 2 (two) times daily.    . niacin (SLO-NIACIN) 500 MG tablet Take 500 mg by mouth 2 (two) times daily with a meal.    . Omega-3 Fatty Acids (OMEGA-3 FISH OIL) 500 MG CAPS Take 500 mg by mouth 2 (two) times daily.     Objective: BP 130/82 mmHg  Pulse 88  Temp(Src) 98.7 F (37.1 C)  Wt 247 lb (112.038 kg)  SpO2 94% Gen: NAD, resting comfortably, appears tired HEENT: Turbinates erythematous with some clear drainage, TM normal, pharynx mildly erythematous with no tonsilar exudate or edema, no sinus tenderness CV: RRR no murmurs rubs or gallops Lungs: CTAB no  crackles, wheeze, rhonchi Abdomen: soft/nontender/nondistended/normal bowel sounds. No rebound or guarding.  Ext: no edema under compression stockings Skin: warm, dry, no rash  Assessment/Plan:  Upper Respiratory infection Symptomatic care with hycodan to help him rest.   History and exam today are suggestive of viral URI.  We discussed that a serious infection or illness is unlikely. We also discussed other potential etiologies none suggestive of bacterial infection at this time. We discussed treatment side effects, likely course, antibiotic misuse, transmission, and signs of developing a serious illness.  Finally, we reviewed reasons to return to care including if symptoms worsen or persist or new concerns arise. See AVS.   Meds ordered this encounter  Medications  . losartan (COZAAR) 100 MG tablet    Sig: Take 1 tablet (100 mg total) by mouth daily.    Dispense:  90 tablet    Refill:  3  . HYDROcodone-homatropine (HYCODAN) 5-1.5 MG/5ML syrup    Sig: Take 5 mLs by mouth every 8 (eight) hours as needed for cough.    Dispense:  240 mL    Refill:  0

## 2014-08-18 ENCOUNTER — Encounter: Payer: Self-pay | Admitting: Gastroenterology

## 2014-09-11 ENCOUNTER — Other Ambulatory Visit (INDEPENDENT_AMBULATORY_CARE_PROVIDER_SITE_OTHER): Payer: Medicare Other

## 2014-09-11 DIAGNOSIS — N400 Enlarged prostate without lower urinary tract symptoms: Secondary | ICD-10-CM

## 2014-09-11 DIAGNOSIS — E785 Hyperlipidemia, unspecified: Secondary | ICD-10-CM | POA: Diagnosis not present

## 2014-09-11 LAB — LIPID PANEL
CHOLESTEROL: 180 mg/dL (ref 0–200)
HDL: 41.9 mg/dL (ref >39.00)
LDL CALC: 107 mg/dL — AB (ref 0–99)
NonHDL: 138.1
Total CHOL/HDL Ratio: 4
Triglycerides: 155 mg/dL — ABNORMAL HIGH (ref 0.0–149.0)
VLDL: 31 mg/dL (ref 0.0–40.0)

## 2014-09-11 LAB — PSA: PSA: 0.02 ng/mL — ABNORMAL LOW (ref 0.10–4.00)

## 2014-09-12 ENCOUNTER — Encounter: Payer: Self-pay | Admitting: Family Medicine

## 2014-09-18 ENCOUNTER — Encounter: Payer: Self-pay | Admitting: Family Medicine

## 2014-09-18 ENCOUNTER — Ambulatory Visit (INDEPENDENT_AMBULATORY_CARE_PROVIDER_SITE_OTHER): Payer: Medicare Other | Admitting: Family Medicine

## 2014-09-18 VITALS — BP 118/78 | HR 77 | Temp 98.2°F | Wt 250.0 lb

## 2014-09-18 DIAGNOSIS — C61 Malignant neoplasm of prostate: Secondary | ICD-10-CM

## 2014-09-18 DIAGNOSIS — E785 Hyperlipidemia, unspecified: Secondary | ICD-10-CM | POA: Diagnosis not present

## 2014-09-18 DIAGNOSIS — I10 Essential (primary) hypertension: Secondary | ICD-10-CM | POA: Diagnosis not present

## 2014-09-18 DIAGNOSIS — M10071 Idiopathic gout, right ankle and foot: Secondary | ICD-10-CM

## 2014-09-18 NOTE — Progress Notes (Signed)
Garret Reddish, MD  Subjective:  [name] is a 64 y.o. year old very pleasant male patient who presents with:  Hyperlipidemia-improved control with 10 year risk calculation down to 13% from 17% with red yeast rice  Lab Results  Component Value Date   LDLCALC 107* 09/11/2014   On statin: red yeast rice- causes muscle aches Regular exercise: sparing Diet: poor control of portion size ROS- no chest pain or shortness of breath. No myalgias  Hypertension-controlled  BP Readings from Last 3 Encounters:  09/18/14 118/78  07/27/14 130/82  07/02/14 150/88   Home BP monitoring-no Compliant with medications-yes without side effects ROS-Denies any CP, HA, SOB, blurry vision, LE edema, transient weakness, orthopnea, PND.   Gout- controlled No flares since being off HCTZ ROS- no hot swollen joints  Past Medical History- prostate cancer s/p resection with q 6 month PSA sent to [place] clinic  Medications- reviewed and updated Current Outpatient Prescriptions  Medication Sig Dispense Refill  . aspirin 325 MG tablet Take 325 mg by mouth daily.    Marland Kitchen CALCIUM PO Take 1 tablet by mouth 2 (two) times daily.     . Cholecalciferol (VITAMIN D3) 2000 UNITS TABS Take 5,000 Units by mouth daily.     Marland Kitchen losartan (COZAAR) 100 MG tablet Take 1 tablet (100 mg total) by mouth daily. 90 tablet 3  . magnesium 30 MG tablet Take 30 mg by mouth 2 (two) times daily.    . niacin (SLO-NIACIN) 500 MG tablet Take 500 mg by mouth 2 (two) times daily with a meal.    . Omega-3 Fatty Acids (OMEGA-3 FISH OIL) 500 MG CAPS Take 500 mg by mouth 2 (two) times daily.     Objective: BP 118/78 mmHg  Pulse 77  Temp(Src) 98.2 F (36.8 C)  Wt 250 lb (113.399 kg) Gen: NAD, resting comfortably CV: RRR no murmurs rubs or gallops Lungs: CTAB no crackles, wheeze, rhonchi Abdomen: soft/nontender/nondistended/normal bowel sounds.Obese Ext: trace edema Skin: warm, dry Neuro: grossly normal, moves all extremities    Assessment/Plan:  Hyperlipidemia Weight actually going up. Reinforced diet/exercise. Patient seems to want to use supplements and states having a hard time losing weight. His exercise is minimal and needs to increase. i still advised statin with 13% 10 year risk but he declines. He may try coq10 to see if reduces muscle aches on red yeast rice. Also asks me about c reactive protein and b homocysteine and we had a long discussion about inconclusive evidence for their use.    Essential hypertension, benign Continue losartan as controlled   Gout Controlled off HCTZ. Ordered uric acid to check before next visit     AWV 6 monhs-gave forms  Future fasting labs Orders Placed This Encounter  Procedures  . PSA    Standing Status: Future     Number of Occurrences:      Standing Expiration Date: 09/18/2015  . LDL cholesterol, direct    [place]    Standing Status: Future     Number of Occurrences:      Standing Expiration Date: 09/18/2015  . CBC    [place]    Standing Status: Future     Number of Occurrences:      Standing Expiration Date: 09/18/2015  . Comprehensive metabolic panel    [place]    Standing Status: Future     Number of Occurrences:      Standing Expiration Date: 09/18/2015  . TSH    [place]    Standing Status:  Future     Number of Occurrences:      Standing Expiration Date: 09/18/2015  . Uric Acid    Standing Status: Future     Number of Occurrences:      Standing Expiration Date: 09/18/2015    Meds ordered this encounter  Medications  . Red Yeast Rice Extract (RED YEAST RICE PO)    Sig: Take by mouth.

## 2014-09-18 NOTE — Patient Instructions (Addendum)
Schedule a lab visit at the front desk for a week before your 6 month follow up. Return for future fasting labs. Nothing but water after midnight please.   Schedule annual wellness visit for 6 months at front desk. Fill out form before you come in.   BP looks great  Cholesterol has improved- red yeast rice seems to be working if you can tolerate it. Talk to Louie Casa about coenzyme q10.

## 2014-09-18 NOTE — Assessment & Plan Note (Signed)
Weight actually going up. Reinforced diet/exercise. Patient seems to want to use supplements and states having a hard time losing weight. His exercise is minimal and needs to increase. i still advised statin with 13% 10 year risk but he declines. He may try coq10 to see if reduces muscle aches on red yeast rice. Also asks me about c reactive protein and b homocysteine and we had a long discussion about inconclusive evidence for their use.

## 2014-09-18 NOTE — Assessment & Plan Note (Signed)
Controlled off HCTZ. Ordered uric acid to check before next visit

## 2014-09-18 NOTE — Assessment & Plan Note (Signed)
Continue losartan as controlled

## 2014-11-30 DIAGNOSIS — K432 Incisional hernia without obstruction or gangrene: Secondary | ICD-10-CM | POA: Diagnosis not present

## 2014-11-30 DIAGNOSIS — K439 Ventral hernia without obstruction or gangrene: Secondary | ICD-10-CM | POA: Diagnosis not present

## 2014-11-30 DIAGNOSIS — K5730 Diverticulosis of large intestine without perforation or abscess without bleeding: Secondary | ICD-10-CM | POA: Diagnosis not present

## 2014-11-30 DIAGNOSIS — N200 Calculus of kidney: Secondary | ICD-10-CM | POA: Diagnosis not present

## 2014-11-30 DIAGNOSIS — Z9079 Acquired absence of other genital organ(s): Secondary | ICD-10-CM | POA: Diagnosis not present

## 2014-11-30 DIAGNOSIS — D171 Benign lipomatous neoplasm of skin and subcutaneous tissue of trunk: Secondary | ICD-10-CM | POA: Diagnosis not present

## 2014-12-13 DIAGNOSIS — Z08 Encounter for follow-up examination after completed treatment for malignant neoplasm: Secondary | ICD-10-CM | POA: Diagnosis not present

## 2014-12-13 DIAGNOSIS — Z85828 Personal history of other malignant neoplasm of skin: Secondary | ICD-10-CM | POA: Diagnosis not present

## 2014-12-13 DIAGNOSIS — L570 Actinic keratosis: Secondary | ICD-10-CM | POA: Diagnosis not present

## 2014-12-13 DIAGNOSIS — X32XXXD Exposure to sunlight, subsequent encounter: Secondary | ICD-10-CM | POA: Diagnosis not present

## 2014-12-15 ENCOUNTER — Encounter: Payer: Self-pay | Admitting: Family Medicine

## 2015-01-05 ENCOUNTER — Encounter: Payer: Self-pay | Admitting: Family Medicine

## 2015-01-05 NOTE — Telephone Encounter (Signed)
Bevelyn Ngo can you make sure his med list is updated. I responded to him.

## 2015-02-06 ENCOUNTER — Ambulatory Visit (INDEPENDENT_AMBULATORY_CARE_PROVIDER_SITE_OTHER): Payer: Medicare Other | Admitting: Family Medicine

## 2015-02-06 VITALS — BP 130/74 | HR 82 | Temp 98.8°F | Resp 18 | Ht 73.0 in | Wt 255.0 lb

## 2015-02-06 DIAGNOSIS — S60459A Superficial foreign body of unspecified finger, initial encounter: Secondary | ICD-10-CM

## 2015-02-06 DIAGNOSIS — S60450A Superficial foreign body of right index finger, initial encounter: Secondary | ICD-10-CM | POA: Diagnosis not present

## 2015-02-06 DIAGNOSIS — Z23 Encounter for immunization: Secondary | ICD-10-CM | POA: Diagnosis not present

## 2015-02-06 NOTE — Patient Instructions (Addendum)
Keep the finger clean  Return if problems  You have received a flu shot today.  Influenza Virus Vaccine injection (Fluarix) What is this medicine? INFLUENZA VIRUS VACCINE (in floo EN zuh VAHY ruhs vak SEEN) helps to reduce the risk of getting influenza also known as the flu. This medicine may be used for other purposes; ask your health care provider or pharmacist if you have questions. What should I tell my health care provider before I take this medicine? They need to know if you have any of these conditions: -bleeding disorder like hemophilia -fever or infection -Guillain-Barre syndrome or other neurological problems -immune system problems -infection with the human immunodeficiency virus (HIV) or AIDS -low blood platelet counts -multiple sclerosis -an unusual or allergic reaction to influenza virus vaccine, eggs, chicken proteins, latex, gentamicin, other medicines, foods, dyes or preservatives -pregnant or trying to get pregnant -breast-feeding How should I use this medicine? This vaccine is for injection into a muscle. It is given by a health care professional. A copy of Vaccine Information Statements will be given before each vaccination. Read this sheet carefully each time. The sheet may change frequently. Talk to your pediatrician regarding the use of this medicine in children. Special care may be needed. Overdosage: If you think you have taken too much of this medicine contact a poison control center or emergency room at once. NOTE: This medicine is only for you. Do not share this medicine with others. What if I miss a dose? This does not apply. What may interact with this medicine? -chemotherapy or radiation therapy -medicines that lower your immune system like etanercept, anakinra, infliximab, and adalimumab -medicines that treat or prevent blood clots like warfarin -phenytoin -steroid medicines like prednisone or cortisone -theophylline -vaccines This list may not  describe all possible interactions. Give your health care provider a list of all the medicines, herbs, non-prescription drugs, or dietary supplements you use. Also tell them if you smoke, drink alcohol, or use illegal drugs. Some items may interact with your medicine. What should I watch for while using this medicine? Report any side effects that do not go away within 3 days to your doctor or health care professional. Call your health care provider if any unusual symptoms occur within 6 weeks of receiving this vaccine. You may still catch the flu, but the illness is not usually as bad. You cannot get the flu from the vaccine. The vaccine will not protect against colds or other illnesses that may cause fever. The vaccine is needed every year. What side effects may I notice from receiving this medicine? Side effects that you should report to your doctor or health care professional as soon as possible: -allergic reactions like skin rash, itching or hives, swelling of the face, lips, or tongue Side effects that usually do not require medical attention (report to your doctor or health care professional if they continue or are bothersome): -fever -headache -muscle aches and pains -pain, tenderness, redness, or swelling at site where injected -weak or tired This list may not describe all possible side effects. Call your doctor for medical advice about side effects. You may report side effects to FDA at 1-800-FDA-1088. Where should I keep my medicine? This vaccine is only given in a clinic, pharmacy, doctor's office, or other health care setting and will not be stored at home. NOTE: This sheet is a summary. It may not cover all possible information. If you have questions about this medicine, talk to your doctor, pharmacist, or health care  provider.    2016, Elsevier/Gold Standard. (2007-11-10 09:30:40)

## 2015-02-06 NOTE — Progress Notes (Signed)
Patient ID: [name], male    DOB: 08-31-1950  Age: 63 y.o. MRN: [id number]  Chief Complaint  Patient presents with  . Foreign Body    under rt index finger   . Flu Vaccine    Subjective:   Patient noted that he had a splinter under his right index finger. He didn't want to get infected. It is not painful.  Current allergies, medications, problem list, past/family and social histories reviewed.  Objective:  BP 130/74 mmHg  Pulse 82  Temp(Src) 98.8 F (37.1 C) (Oral)  Resp 18  Ht 6\' 1"  (1.854 m)  Wt 255 lb (115.667 kg)  BMI 33.65 kg/m2  SpO2 97%  No acute distress. I was able to remove the splinter with forceps and a 18-gauge needle. It came out in fragments. Trimmed a tiny bit of the nail back. Patient tolerated this well.  Assessment & Plan:   Assessment: 1. Needs flu shot   2. Subungual foreign body of finger, initial encounter       Plan: No special care needed. He is to watch closely. If he is concerned he will let me know.  Orders Placed This Encounter  Procedures  . Flu Vaccine QUAD 36+ mos IM     Patient Instructions  Keep the finger clean  Return if problems  You have received a flu shot today.  Influenza Virus Vaccine injection (Fluarix) What is this medicine? INFLUENZA VIRUS VACCINE (in floo EN zuh VAHY ruhs vak SEEN) helps to reduce the risk of getting influenza also known as the flu. This medicine may be used for other purposes; ask your health care provider or pharmacist if you have questions. What should I tell my health care provider before I take this medicine? They need to know if you have any of these conditions: -bleeding disorder like hemophilia -fever or infection -Guillain-Barre syndrome or other neurological problems -immune system problems -infection with the human immunodeficiency virus (HIV) or AIDS -low blood platelet counts -multiple sclerosis -an unusual or allergic reaction to influenza virus vaccine, eggs, chicken  proteins, latex, gentamicin, other medicines, foods, dyes or preservatives -pregnant or trying to get pregnant -breast-feeding How should I use this medicine? This vaccine is for injection into a muscle. It is given by a health care professional. A copy of Vaccine Information Statements will be given before each vaccination. Read this sheet carefully each time. The sheet may change frequently. Talk to your pediatrician regarding the use of this medicine in children. Special care may be needed. Overdosage: If you think you have taken too much of this medicine contact a poison control center or emergency room at once. NOTE: This medicine is only for you. Do not share this medicine with others. What if I miss a dose? This does not apply. What may interact with this medicine? -chemotherapy or radiation therapy -medicines that lower your immune system like etanercept, anakinra, infliximab, and adalimumab -medicines that treat or prevent blood clots like warfarin -phenytoin -steroid medicines like prednisone or cortisone -theophylline -vaccines This list may not describe all possible interactions. Give your health care provider a list of all the medicines, herbs, non-prescription drugs, or dietary supplements you use. Also tell them if you smoke, drink alcohol, or use illegal drugs. Some items may interact with your medicine. What should I watch for while using this medicine? Report any side effects that do not go away within 3 days to your doctor or health care professional. Call your health care provider if any  unusual symptoms occur within 6 weeks of receiving this vaccine. You may still catch the flu, but the illness is not usually as bad. You cannot get the flu from the vaccine. The vaccine will not protect against colds or other illnesses that may cause fever. The vaccine is needed every year. What side effects may I notice from receiving this medicine? Side effects that you should report to  your doctor or health care professional as soon as possible: -allergic reactions like skin rash, itching or hives, swelling of the face, lips, or tongue Side effects that usually do not require medical attention (report to your doctor or health care professional if they continue or are bothersome): -fever -headache -muscle aches and pains -pain, tenderness, redness, or swelling at site where injected -weak or tired This list may not describe all possible side effects. Call your doctor for medical advice about side effects. You may report side effects to FDA at 1-800-FDA-1088. Where should I keep my medicine? This vaccine is only given in a clinic, pharmacy, doctor's office, or other health care setting and will not be stored at home. NOTE: This sheet is a summary. It may not cover all possible information. If you have questions about this medicine, talk to your doctor, pharmacist, or health care provider.    2016, Elsevier/Gold Standard. (2007-11-10 09:30:40)      Return if symptoms worsen or fail to improve.   Dejane Scheibe, MD 02/06/2015

## 2015-03-05 ENCOUNTER — Encounter: Payer: Self-pay | Admitting: Family Medicine

## 2015-03-05 NOTE — Telephone Encounter (Signed)
[name] you can add UA under hypertension a1c under obesity (as has not had elevated a1c not 100% sure will be covered) Hepatitis C antibody under exposure to viral illness  Inform patient too early for insurance to cover repeat lipid panel- must wait full year I will not order the other tests without consultation in person first so those will not be added on. I do not think that will change anything in his management.  Vitamin D is very difficult to get covered

## 2015-03-08 ENCOUNTER — Ambulatory Visit (INDEPENDENT_AMBULATORY_CARE_PROVIDER_SITE_OTHER): Payer: Medicare Other | Admitting: Family Medicine

## 2015-03-08 ENCOUNTER — Encounter: Payer: Self-pay | Admitting: Family Medicine

## 2015-03-08 VITALS — BP 122/80 | Temp 98.5°F | Wt 256.0 lb

## 2015-03-08 DIAGNOSIS — M8580 Other specified disorders of bone density and structure, unspecified site: Secondary | ICD-10-CM | POA: Diagnosis not present

## 2015-03-08 DIAGNOSIS — E785 Hyperlipidemia, unspecified: Secondary | ICD-10-CM

## 2015-03-08 DIAGNOSIS — R739 Hyperglycemia, unspecified: Secondary | ICD-10-CM | POA: Diagnosis not present

## 2015-03-08 DIAGNOSIS — I10 Essential (primary) hypertension: Secondary | ICD-10-CM | POA: Diagnosis not present

## 2015-03-08 DIAGNOSIS — Z20828 Contact with and (suspected) exposure to other viral communicable diseases: Secondary | ICD-10-CM

## 2015-03-08 NOTE — Progress Notes (Signed)
BP 122/80 mmHg  Temp(Src) 98.5 F (36.9 C)  Wt 256 lb (116.121 kg)  >50% of 28 minute  (5pm-5:28pm) office visit was spent on counseling (questions about additional blood tests that patient requested, billing concerns about labs and questions about how his insurance works in general) and coordination of care   Patient requested addition of CRP, homocysteine, vit D, coq10 to AWV labs. We discussed unclear how these would affect his care if high or low. It is important for him to have these as he likes to discuss results with Louie Casa at [facility] so it would be important for him even if I could not provide guidance. He chooses to proceed forward. Extensive counseling provided today  Other labs also added- UA, triglycerides, a1c given prior a1c 5.8 two tests ago

## 2015-03-08 NOTE — Patient Instructions (Signed)
We ordered all lab tests but Vitamin D  I would talk to Dorna Bloom about he advance beneficiary notice of noncoverage. Alos would ask her the question about the 100% of medicare -approved amount for the tests. Since there was no block on me ordering therm, I assume they will be paid for though.

## 2015-03-16 ENCOUNTER — Other Ambulatory Visit (INDEPENDENT_AMBULATORY_CARE_PROVIDER_SITE_OTHER): Payer: Medicare Other

## 2015-03-16 DIAGNOSIS — R739 Hyperglycemia, unspecified: Secondary | ICD-10-CM

## 2015-03-16 DIAGNOSIS — Z20828 Contact with and (suspected) exposure to other viral communicable diseases: Secondary | ICD-10-CM

## 2015-03-16 DIAGNOSIS — E785 Hyperlipidemia, unspecified: Secondary | ICD-10-CM

## 2015-03-16 DIAGNOSIS — I10 Essential (primary) hypertension: Secondary | ICD-10-CM

## 2015-03-16 DIAGNOSIS — M8580 Other specified disorders of bone density and structure, unspecified site: Secondary | ICD-10-CM

## 2015-03-16 DIAGNOSIS — C61 Malignant neoplasm of prostate: Secondary | ICD-10-CM | POA: Diagnosis not present

## 2015-03-16 DIAGNOSIS — M10071 Idiopathic gout, right ankle and foot: Secondary | ICD-10-CM

## 2015-03-16 LAB — LDL CHOLESTEROL, DIRECT: LDL DIRECT: 138 mg/dL

## 2015-03-16 LAB — COMPREHENSIVE METABOLIC PANEL
ALBUMIN: 4 g/dL (ref 3.5–5.2)
ALK PHOS: 56 U/L (ref 39–117)
ALT: 20 U/L (ref 0–53)
AST: 19 U/L (ref 0–37)
BILIRUBIN TOTAL: 0.7 mg/dL (ref 0.2–1.2)
BUN: 15 mg/dL (ref 6–23)
CALCIUM: 9.1 mg/dL (ref 8.4–10.5)
CO2: 29 mEq/L (ref 19–32)
CREATININE: 0.84 mg/dL (ref 0.40–1.50)
Chloride: 104 mEq/L (ref 96–112)
GFR: 97.22 mL/min (ref >60.00)
Glucose, Bld: 87 mg/dL (ref 70–99)
Potassium: 4.2 mEq/L (ref 3.5–5.1)
Sodium: 141 mEq/L (ref 135–145)
TOTAL PROTEIN: 6.6 g/dL (ref 6.0–8.3)

## 2015-03-16 LAB — POCT URINALYSIS DIPSTICK
BILIRUBIN UA: NEGATIVE
Ketones, UA: NEGATIVE
LEUKOCYTES UA: NEGATIVE
NITRITE UA: NEGATIVE
PH UA: 5
Protein, UA: NEGATIVE
Spec Grav, UA: <=1.005
UROBILINOGEN UA: 0.2

## 2015-03-16 LAB — CBC
HCT: 47.7 % (ref 39.0–52.0)
HEMOGLOBIN: 15.8 g/dL (ref 13.0–17.0)
MCHC: 33.2 g/dL (ref 30.0–36.0)
MCV: 85.6 fl (ref 78.0–100.0)
PLATELETS: 236 10*3/uL (ref 150.0–400.0)
RBC: 5.57 Mil/uL (ref 4.22–5.81)
RDW: 14.2 % (ref 11.5–15.5)
WBC: 7.2 10*3/uL (ref 4.0–10.5)

## 2015-03-16 LAB — VITAMIN D 25 HYDROXY (VIT D DEFICIENCY, FRACTURES): VITD: 41.84 ng/mL (ref 30.00–100.00)

## 2015-03-16 LAB — TRIGLYCERIDES: TRIGLYCERIDES: 149 mg/dL (ref 0.0–149.0)

## 2015-03-16 LAB — C-REACTIVE PROTEIN: CRP: 0.5 mg/dL (ref 0.5–20.0)

## 2015-03-16 LAB — HEMOGLOBIN A1C: HEMOGLOBIN A1C: 5.7 % (ref 4.6–6.5)

## 2015-03-16 LAB — URIC ACID: URIC ACID, SERUM: 6.1 mg/dL (ref 4.0–7.8)

## 2015-03-16 LAB — PSA: PSA: 0.01 ng/mL — AB (ref 0.10–4.00)

## 2015-03-16 LAB — TSH: TSH: 1.49 u[IU]/mL (ref 0.35–4.50)

## 2015-03-17 LAB — HEPATITIS C ANTIBODY: HCV Ab: NEGATIVE

## 2015-03-17 LAB — HIV ANTIBODY (ROUTINE TESTING W REFLEX): HIV: NONREACTIVE

## 2015-03-17 LAB — HOMOCYSTEINE: HOMOCYSTEINE: 9.2 umol/L (ref <11.4)

## 2015-03-21 LAB — PLASMA COENZYME Q10, BLOOD: Plasma CoEnzyme Q10: 2.75 mg/L — ABNORMAL HIGH (ref 0.44–1.64)

## 2015-03-23 ENCOUNTER — Encounter: Payer: Medicare Other | Admitting: Family Medicine

## 2015-03-28 ENCOUNTER — Ambulatory Visit (INDEPENDENT_AMBULATORY_CARE_PROVIDER_SITE_OTHER): Payer: Medicare Other | Admitting: Family Medicine

## 2015-03-28 ENCOUNTER — Encounter: Payer: Self-pay | Admitting: Family Medicine

## 2015-03-28 VITALS — BP 144/80 | HR 73 | Temp 98.9°F | Wt 253.0 lb

## 2015-03-28 DIAGNOSIS — E785 Hyperlipidemia, unspecified: Secondary | ICD-10-CM | POA: Diagnosis not present

## 2015-03-28 DIAGNOSIS — R319 Hematuria, unspecified: Secondary | ICD-10-CM | POA: Diagnosis not present

## 2015-03-28 DIAGNOSIS — Z0001 Encounter for general adult medical examination with abnormal findings: Secondary | ICD-10-CM | POA: Diagnosis not present

## 2015-03-28 DIAGNOSIS — Z0000 Encounter for general adult medical examination without abnormal findings: Secondary | ICD-10-CM

## 2015-03-28 LAB — URINALYSIS, MICROSCOPIC ONLY: RBC / HPF: NONE SEEN (ref 0–?)

## 2015-03-28 NOTE — Progress Notes (Signed)
Roberto Reddish, MD Phone: [phone]  Subjective:  Patient presents today for their welcome to medicare exam.    Preventive Screening-Counseling & Management  Smoking Status: Never Smoker Second Hand Smoking status: No smokers in home  Risk Factors Regular exercise: yes see scan sheet Diet: working on weight loss Wt Readings from Last 3 Encounters:  03/28/15 253 lb (114.76 kg)  03/08/15 256 lb (116.121 kg)  02/06/15 255 lb (115.667 kg)   Fall Risk: None   Cardiac risk factors:  advanced age  Hyperlipidemia - declines statin. Is on red yeast rice. 18.4% 10 year risk Lab Results  Component Value Date   CHOL 180 09/11/2014   HDL 41.90 09/11/2014   LDLCALC 107* 09/11/2014   LDLDIRECT 138.0 03/16/2015   TRIG 149.0 03/16/2015   CHOLHDL 4 09/11/2014  No diabetes.  Family History: CVA in father age 74 but former smoker   Depression Screen None. PHQ2 0   Activities of Daily Living Independent ADLs and IADLs   Hearing Difficulties: -patient declines  Cognitive Testing No reported trouble.   Normal 3 word recall  Eye Exam: Declines exam today, sees optho in February  List the Names of Other Physician/Practitioners you currently use: 1.Dr. Rozann Lesches of dermatology 2. Optho Dr. Rachelle Hora 3. Dr. Delene Loll [facility] for hernia  Advanced directives: will seek estate planning. Full code at present  Immunization History  Administered Date(s) Administered  . Influenza Split 01/31/2011, 02/26/2012  . Influenza Whole 04/28/2005, 01/24/2008, 02/19/2009  . Influenza,inj,Quad PF,36+ Mos 03/14/2013, 03/20/2014, 02/06/2015  . Pneumococcal Conjugate-13 07/27/2014  . Td 04/28/2005  . Tdap 03/11/2014  . Zoster 10/25/2009, 10/25/2009   Required Immunizations needed today none  Screening tests- will call to schedule and wants to see Dr. Henrene Pastor Health Maintenance Due  Topic Date Due  . COLONOSCOPY  10/13/2013    ROS- No pertinent positives discovered in course  of AWV Except some ear wax. Has no chest pain, shortness of breath with activity.   The following were reviewed and entered/updated in epic: Past Medical History  Diagnosis Date  . Obesity   . Hyperlipidemia   . Allergy   . Diverticulosis   . Hemorrhoids   . Hx of adenomatous colonic polyps   . DVT (deep venous thrombosis) ([place])     1990s  . 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 Rhinitis   . Prostate cancer ([place])   . Hypertension   . DIVERTICULOSIS, COLON 12/30/2006    Qualifier: Diagnosis of  By: Rogue Bussing CMA, Jacqualynn     Patient Active Problem List   Diagnosis Date Noted  . Prostate cancer ([place]) 06/18/2011    Priority: High  . Gout 12/27/2013    Priority: Medium  . Essential hypertension, benign 09/11/2012    Priority: Medium  . Hyperlipidemia 12/30/2006    Priority: Medium  . Left groin pain 12/24/2012    Priority: Low  . ANKLE PAIN, CHRONIC 05/01/2009    Priority: Low  . Blood in stool 10/11/2008    Priority: Low  . Osteopenia 08/15/2008    Priority: Low  . VARICOSE VEINS LOWER EXTREMITIES W/INFLAMMATION 11/01/2007    Priority: Low  . COLONIC POLYPS, HX OF 12/30/2006    Priority: Low   Past Surgical History  Procedure Laterality Date  . Eye surgery    . Fx l tibia,fibula with external fixator    . Remove fixator    . Varicocele repair    . Bilateral vein ligation    .  Prostatectomy  2013    Open radical prostatecomy  . Fracture surgery      Family History  Problem Relation Age of Onset  . Diabetes Father   . Coronary artery disease Maternal Grandfather     67 never smoker  . Colon polyps Father   . Stroke Father     33 former smoker  . Stroke Maternal Grandfather   . Cancer Paternal Grandmother     pancreatic, breast  . Transient ischemic attack Mother     14, 23    Medications- reviewed and updated Current Outpatient Prescriptions  Medication Sig Dispense Refill  . aspirin 325 MG tablet Take 325 mg by  mouth daily.    Marland Kitchen CALCIUM PO Take 1 tablet by mouth 2 (two) times daily.     . Cholecalciferol (VITAMIN D3) 2000 UNITS TABS Take 5,000 Units by mouth daily.     Marland Kitchen losartan (COZAAR) 100 MG tablet Take 1 tablet (100 mg total) by mouth daily. 90 tablet 3  . magnesium 30 MG tablet Take 30 mg by mouth 2 (two) times daily.    . niacin (SLO-NIACIN) 500 MG tablet Take 500 mg by mouth 2 (two) times daily with a meal.    . Omega-3 Fatty Acids (OMEGA-3 FISH OIL) 500 MG CAPS Take 500 mg by mouth 2 (two) times daily.    . Red Yeast Rice Extract (RED YEAST RICE PO) Take by mouth.    . [DISCONTINUED] Calcium-Vitamin D (CALTRATE 600 PLUS-VIT D PO) Take by mouth 2 (two) times daily.      Allergies-reviewed and updated Allergies  Allergen Reactions  . Adhesive [Tape]     Irritation.  . Fentanyl     REACTION: N/V  . Latex     Irritation at site.  . Levofloxacin     Light headed    Social History   Social History  . Marital Status: Married    Spouse Name: N/A  . Number of Children: N/A  . Years of Education: N/A   Occupational History  . retired    Social History Main Topics  . Smoking status: Never Smoker   . Smokeless tobacco: Never Used  . Alcohol Use: Yes     Comment: occasional  . Drug Use: Yes    Special: Other-see comments  . Sexual Activity: Not Asked   Other Topics Concern  . None   Social History Narrative   Married. No kids. No pets.       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 Oropharynx normal Neck: no thyromegaly CV: RRR no murmurs rubs or gallops Lungs: CTAB no crackles, wheeze, rhonchi Abdomen: soft/nontender/nondistended/normal bowel sounds. No rebound or guarding.  Ext: no edema Skin: warm, dry Neuro: grossly normal, moves all extremities, PERRLA  Assessment/Plan:  Low back  pain intermittent- consider PT. Will write rx if needed. No red flags.   Repeat UA for hematuria- suspect lab error  Hyperlipidemia-Advised statin given >15% risk but patient declines. Return in 6 monhts with 20 lbs weight loss hoped for  Welcome to Medicare completed as above Full history reviewed and no significant changes Was evaluated at [facility] for hernia and plan is to work on weight loss (in care everywhere)  Orders Placed This Encounter  Procedures  . Urine Microscopic Only

## 2015-03-28 NOTE — Patient Instructions (Addendum)
Stop by the lab and leave urine sample on your way out.  Please call GI to set up repeat colonoscopy   [name] , Thank you for taking time to come for your Medicare Wellness Visit. I appreciate your ongoing commitment to your health goals. Please review the following plan we discussed and let me know if I can assist you in the future.   These are the goals we discussed: 1. Continue exercise 2. Work on [employer] loss   This is a list of the screening recommended for you and due dates:  Health Maintenance  Topic Date Due  . Colon Cancer Screening  10/13/2013  . Pneumonia vaccines (2 of 2 - PPSV23) 07/27/2015  . Flu Shot  11/27/2015  . Tetanus Vaccine  03/11/2024  . Shingles Vaccine  Completed  .  Hepatitis C: One time screening is recommended by Center for Disease Control  (CDC) for  adults born from [birth year] through 1966.   Completed  . HIV Screening  Completed

## 2015-03-29 DIAGNOSIS — X32XXXD Exposure to sunlight, subsequent encounter: Secondary | ICD-10-CM | POA: Diagnosis not present

## 2015-03-29 DIAGNOSIS — L723 Sebaceous cyst: Secondary | ICD-10-CM | POA: Diagnosis not present

## 2015-03-29 DIAGNOSIS — L570 Actinic keratosis: Secondary | ICD-10-CM | POA: Diagnosis not present

## 2015-03-29 DIAGNOSIS — T07 Unspecified multiple injuries: Secondary | ICD-10-CM | POA: Diagnosis not present

## 2015-03-29 DIAGNOSIS — L739 Follicular disorder, unspecified: Secondary | ICD-10-CM | POA: Diagnosis not present

## 2015-05-24 ENCOUNTER — Other Ambulatory Visit: Payer: Self-pay | Admitting: Family Medicine

## 2015-05-24 ENCOUNTER — Encounter: Payer: Self-pay | Admitting: Family Medicine

## 2015-05-24 DIAGNOSIS — R739 Hyperglycemia, unspecified: Secondary | ICD-10-CM

## 2015-05-24 DIAGNOSIS — E785 Hyperlipidemia, unspecified: Secondary | ICD-10-CM

## 2015-05-24 NOTE — Telephone Encounter (Signed)
From uptodate  "Vitamin K is a cofactor for some proteins involved in bone mineralization. Clinical trials have examined the use of vitamin K1 (phylloquinone) or vitamin K2 for the treatment of osteoporosis, with conflicting results." Basically I do not have enough data to recommend for or against k2.   Calcium intake should be 1200mg  per day between supplements and diet- seems to be best to get this from diet.   Great job on weight loss!   6 months follow up is fine for me. Bevelyn Ngo- please set him up with a1c under hyperglycemia and lipid panel under hyperlipidemia- make sure it is a full year from last lipid panel

## 2015-05-28 ENCOUNTER — Ambulatory Visit (AMBULATORY_SURGERY_CENTER): Payer: Self-pay | Admitting: *Deleted

## 2015-05-28 VITALS — Ht 73.0 in | Wt 234.0 lb

## 2015-05-28 DIAGNOSIS — Z86010 Personal history of colonic polyps: Secondary | ICD-10-CM

## 2015-05-28 MED ORDER — NA SULFATE-K SULFATE-MG SULF 17.5-3.13-1.6 GM/177ML PO SOLN: 1.0000 | Freq: Once | ORAL | Status: DC

## 2015-05-28 NOTE — Progress Notes (Signed)
No egg or soy allergy known to patient  Past issues with past sedation with any surgeries  or procedures-- pt is allergic to fentanyl- had terrible reaction with fentanyl- felt terrible, thought head was going to explode , no intubation problems  No diet pills per patient No home 02 use per patient  No blood thinners per patient    Samples of this drug were given to the patient, quantity suprep, Lot Number [id number] exp 12-18

## 2015-06-11 ENCOUNTER — Encounter: Payer: Self-pay | Admitting: Gastroenterology

## 2015-06-11 ENCOUNTER — Ambulatory Visit (AMBULATORY_SURGERY_CENTER): Payer: Medicare Other | Admitting: Gastroenterology

## 2015-06-11 VITALS — BP 129/76 | HR 68 | Temp 96.7°F | Resp 18 | Ht 73.0 in | Wt 234.0 lb

## 2015-06-11 DIAGNOSIS — Z86010 Personal history of colonic polyps: Secondary | ICD-10-CM

## 2015-06-11 DIAGNOSIS — I10 Essential (primary) hypertension: Secondary | ICD-10-CM | POA: Diagnosis not present

## 2015-06-11 DIAGNOSIS — D122 Benign neoplasm of ascending colon: Secondary | ICD-10-CM | POA: Diagnosis not present

## 2015-06-11 DIAGNOSIS — K635 Polyp of colon: Secondary | ICD-10-CM | POA: Diagnosis not present

## 2015-06-11 DIAGNOSIS — E7800 Pure hypercholesterolemia, unspecified: Secondary | ICD-10-CM | POA: Diagnosis not present

## 2015-06-11 MED ORDER — SODIUM CHLORIDE 0.9 % IV SOLN: 500.0000 mL | INTRAVENOUS | Status: DC

## 2015-06-11 NOTE — Progress Notes (Signed)
Report to PACU, RN, vss, BBS= Clear.  

## 2015-06-11 NOTE — Progress Notes (Signed)
Called to room to assist during endoscopic procedure.  Patient ID and intended procedure confirmed with present staff. Received instructions for my participation in the procedure from the performing physician.  

## 2015-06-11 NOTE — Patient Instructions (Signed)
YOU HAD AN ENDOSCOPIC PROCEDURE TODAY AT [facility]:   Refer to the procedure report that was given to you for any specific questions about what was found during the examination.  If the procedure report does not answer your questions, please call your gastroenterologist to clarify.  If you requested that your care partner not be given the details of your procedure findings, then the procedure report has been included in a sealed envelope for you to review at your convenience later.  YOU SHOULD EXPECT: Some feelings of bloating in the abdomen. Passage of more gas than usual.  Walking can help get rid of the air that was put into your GI tract during the procedure and reduce the bloating. If you had a lower endoscopy (such as a colonoscopy or flexible sigmoidoscopy) you may notice spotting of blood in your stool or on the toilet paper. If you underwent a bowel prep for your procedure, you may not have a normal bowel movement for a few days.  Please Note:  You might notice some irritation and congestion in your nose or some drainage.  This is from the oxygen used during your procedure.  There is no need for concern and it should clear up in a day or so.  SYMPTOMS TO REPORT IMMEDIATELY:   Following lower endoscopy (colonoscopy or flexible sigmoidoscopy):  Excessive amounts of blood in the stool  Significant tenderness or worsening of abdominal pains  Swelling of the abdomen that is new, acute  Fever of 100F or higher    For urgent or emergent issues, a gastroenterologist can be reached at any hour by calling [phone].   DIET: Your first meal following the procedure should be a small meal and then it is ok to progress to your normal diet. Heavy or fried foods are harder to digest and may make you feel nauseous or bloated.  Likewise, meals heavy in dairy and vegetables can increase bloating.  Drink plenty of fluids but you should avoid alcoholic beverages for 24  hours.  ACTIVITY:  You should plan to take it easy for the rest of today and you should NOT DRIVE or use heavy machinery until tomorrow (because of the sedation medicines used during the test).    FOLLOW UP: Our staff will call the number listed on your records the next business day following your procedure to check on you and address any questions or concerns that you may have regarding the information given to you following your procedure. If we do not reach you, we will leave a message.  However, if you are feeling well and you are not experiencing any problems, there is no need to return our call.  We will assume that you have returned to your regular daily activities without incident.  If any biopsies were taken you will be contacted by phone or by letter within the next 1-3 weeks.  Please call us at [phone] if you have not heard about the biopsies in 3 weeks.    SIGNATURES/CONFIDENTIALITY: You and/or your care partner have signed paperwork which will be entered into your electronic medical record.  These signatures attest to the fact that that the information above on your After Visit Summary has been reviewed and is understood.  Full responsibility of the confidentiality of this discharge information lies with you and/or your care-partner.   Information on polyps,diverticulosis ,hemorrhoids ,and high fiber diet given to you today

## 2015-06-11 NOTE — Op Note (Signed)
[place]  [address] [place], [address]   COLONOSCOPY PROCEDURE REPORT  PATIENT: [name], [name]  MR#: [id number] BIRTHDATE: 1950/05/16 , 79  yrs. old GENDER: male ENDOSCOPIST: Ladene Artist, MD, [facility] PROCEDURE DATE:  06/11/2015 PROCEDURE:   Colonoscopy, surveillance and Colonoscopy with snare polypectomy First Screening Colonoscopy - Avg.  risk and is 50 yrs.  old or older - No.  Prior Negative Screening - Now for repeat screening. N/A  History of Adenoma - Now for follow-up colonoscopy & has been > or = to 3 yrs.  Yes hx of adenoma.  Has been 3 or more years since last colonoscopy.  Polyps removed today? Yes ASA CLASS:   Class II INDICATIONS:Surveillance due to prior colonic neoplasia and PH Colon Adenoma. MEDICATIONS: Monitored anesthesia care and Propofol 200 mg IV DESCRIPTION OF PROCEDURE:   After the risks benefits and alternatives of the procedure were thoroughly explained, informed consent was obtained.  The digital rectal exam revealed no abnormalities of the rectum.   The [id number]  endoscope was introduced through the anus and advanced to the cecum, which was identified by both the appendix and ileocecal valve. No adverse events experienced.   The quality of the prep was good.  (Suprep was used)  The instrument was then slowly withdrawn as the colon was fully examined. Estimated blood loss is zero unless otherwise noted in this procedure report.    COLON FINDINGS: A sessile polyp measuring 7 mm in size was found in the ascending colon.  A polypectomy was performed with a cold snare.  The resection was complete, the polyp tissue was completely retrieved and sent to histology.   There was moderate diverticulosis noted in the sigmoid colon and descending colon with associated luminal narrowing and muscular hypertrophy.   There was mild diverticulosis noted in the transverse colon.   The examination was otherwise normal.  Retroflexed  views revealed internal Grade I hemorrhoids. The time to cecum = 2.4 Withdrawal time = 9.4   The scope was withdrawn and the procedure completed. COMPLICATIONS: There were no immediate complications.  ENDOSCOPIC IMPRESSION: 1.   Sessile polyp in the ascending colon; polypectomy performed with a cold snare 2.   Moderate diverticulosis in the sigmoid colon and descending colon 3.   Mild diverticulosis in the transverse colon 4.   Grade l internal hemorrhoids  RECOMMENDATIONS: 1.  Await pathology results 2.  Repeat colonoscopy in 5 years if polyp adenomatous; otherwise 10 years 3.  High fiber diet with liberal fluid intake.  eSigned:  Ladene Artist, MD, [facility] 06/11/2015 9:16 AM  [C

## 2015-06-12 ENCOUNTER — Telehealth: Payer: Self-pay | Admitting: *Deleted

## 2015-06-12 NOTE — Telephone Encounter (Signed)
  Follow up Call-  Call back number 06/11/2015  Post procedure Call Back phone  # [phone]  Permission to leave phone message Yes     Patient questions:  Do you have a fever, pain , or abdominal swelling? No. Pain Score  0 *  Have you tolerated food without any problems? Yes.    Have you been able to return to your normal activities? Yes.    Do you have any questions about your discharge instructions: Diet   No. Medications  No. Follow up visit  No.  Do you have questions or concerns about your Care? No.  Actions: * If pain score is 4 or above: No action needed, pain <4.

## 2015-06-14 ENCOUNTER — Telehealth: Payer: Self-pay | Admitting: Gastroenterology

## 2015-06-14 DIAGNOSIS — H52203 Unspecified astigmatism, bilateral: Secondary | ICD-10-CM | POA: Diagnosis not present

## 2015-06-14 DIAGNOSIS — H01009 Unspecified blepharitis unspecified eye, unspecified eyelid: Secondary | ICD-10-CM | POA: Diagnosis not present

## 2015-06-14 DIAGNOSIS — H527 Unspecified disorder of refraction: Secondary | ICD-10-CM | POA: Diagnosis not present

## 2015-06-14 DIAGNOSIS — H01021 Squamous blepharitis right upper eyelid: Secondary | ICD-10-CM | POA: Diagnosis not present

## 2015-06-14 DIAGNOSIS — H25813 Combined forms of age-related cataract, bilateral: Secondary | ICD-10-CM | POA: Diagnosis not present

## 2015-06-14 DIAGNOSIS — H43812 Vitreous degeneration, left eye: Secondary | ICD-10-CM | POA: Diagnosis not present

## 2015-06-14 DIAGNOSIS — H5213 Myopia, bilateral: Secondary | ICD-10-CM | POA: Diagnosis not present

## 2015-06-14 DIAGNOSIS — Z885 Allergy status to narcotic agent status: Secondary | ICD-10-CM | POA: Diagnosis not present

## 2015-06-14 DIAGNOSIS — Z7982 Long term (current) use of aspirin: Secondary | ICD-10-CM | POA: Diagnosis not present

## 2015-06-14 DIAGNOSIS — Z79899 Other long term (current) drug therapy: Secondary | ICD-10-CM | POA: Diagnosis not present

## 2015-06-14 DIAGNOSIS — H43813 Vitreous degeneration, bilateral: Secondary | ICD-10-CM | POA: Diagnosis not present

## 2015-06-14 DIAGNOSIS — Z881 Allergy status to other antibiotic agents status: Secondary | ICD-10-CM | POA: Diagnosis not present

## 2015-06-14 DIAGNOSIS — H0289 Other specified disorders of eyelid: Secondary | ICD-10-CM | POA: Diagnosis not present

## 2015-06-14 DIAGNOSIS — I10 Essential (primary) hypertension: Secondary | ICD-10-CM | POA: Diagnosis not present

## 2015-06-14 DIAGNOSIS — H35369 Drusen (degenerative) of macula, unspecified eye: Secondary | ICD-10-CM | POA: Diagnosis not present

## 2015-06-14 DIAGNOSIS — H269 Unspecified cataract: Secondary | ICD-10-CM | POA: Diagnosis not present

## 2015-06-14 DIAGNOSIS — H01022 Squamous blepharitis right lower eyelid: Secondary | ICD-10-CM | POA: Diagnosis not present

## 2015-06-14 DIAGNOSIS — H35363 Drusen (degenerative) of macula, bilateral: Secondary | ICD-10-CM | POA: Diagnosis not present

## 2015-06-14 DIAGNOSIS — H40003 Preglaucoma, unspecified, bilateral: Secondary | ICD-10-CM | POA: Diagnosis not present

## 2015-06-15 NOTE — Telephone Encounter (Signed)
Patient reports that he had a small BM this am at 1:30 and then again at 4:30.  He is feeling better.

## 2015-06-18 ENCOUNTER — Encounter: Payer: Self-pay | Admitting: Gastroenterology

## 2015-06-20 DIAGNOSIS — I872 Venous insufficiency (chronic) (peripheral): Secondary | ICD-10-CM | POA: Diagnosis not present

## 2015-06-20 DIAGNOSIS — D225 Melanocytic nevi of trunk: Secondary | ICD-10-CM | POA: Diagnosis not present

## 2015-06-20 DIAGNOSIS — Z1283 Encounter for screening for malignant neoplasm of skin: Secondary | ICD-10-CM | POA: Diagnosis not present

## 2015-06-20 DIAGNOSIS — L84 Corns and callosities: Secondary | ICD-10-CM | POA: Diagnosis not present

## 2015-08-15 ENCOUNTER — Other Ambulatory Visit: Payer: Self-pay | Admitting: Family Medicine

## 2015-08-21 ENCOUNTER — Telehealth: Payer: Self-pay | Admitting: Family Medicine

## 2015-08-21 NOTE — Telephone Encounter (Signed)
Used a SDA for 08/23/15 pt needed to come in soon

## 2015-08-23 ENCOUNTER — Encounter: Payer: Self-pay | Admitting: Family Medicine

## 2015-08-23 ENCOUNTER — Ambulatory Visit (INDEPENDENT_AMBULATORY_CARE_PROVIDER_SITE_OTHER): Payer: Medicare Other | Admitting: Family Medicine

## 2015-08-23 VITALS — BP 120/72 | HR 59 | Temp 98.8°F | Wt 210.0 lb

## 2015-08-23 DIAGNOSIS — R1310 Dysphagia, unspecified: Secondary | ICD-10-CM | POA: Diagnosis not present

## 2015-08-23 DIAGNOSIS — M8580 Other specified disorders of bone density and structure, unspecified site: Secondary | ICD-10-CM | POA: Diagnosis not present

## 2015-08-23 DIAGNOSIS — R6889 Other general symptoms and signs: Secondary | ICD-10-CM

## 2015-08-23 DIAGNOSIS — M85851 Other specified disorders of bone density and structure, right thigh: Secondary | ICD-10-CM | POA: Diagnosis not present

## 2015-08-23 DIAGNOSIS — I10 Essential (primary) hypertension: Secondary | ICD-10-CM | POA: Diagnosis not present

## 2015-08-23 DIAGNOSIS — C61 Malignant neoplasm of prostate: Secondary | ICD-10-CM | POA: Diagnosis not present

## 2015-08-23 DIAGNOSIS — R5383 Other fatigue: Secondary | ICD-10-CM

## 2015-08-23 NOTE — Patient Instructions (Signed)
We will call you within a week about your referral to Gastroenterology for EGD vs. consult. If you do not hear within 2 weeks, give [place] a call.   Schedule a lab visit in about a month then a visit a few days later with me  Try 1/2 tablet of losartan. Check blood pressure 4x a week and keep log and bring to visit. May be able to further reduce if you have continued weight loss

## 2015-08-23 NOTE — Assessment & Plan Note (Signed)
S: controlled on losartan 100mg  BP Readings from Last 3 Encounters:  08/23/15 120/72  06/11/15 129/76  03/28/15 144/80  A/P:Continue current meds:  But reduce dose to 50mg  to see if can tolerate lower dose weight weight loss. Follow up in about a month

## 2015-08-23 NOTE — Progress Notes (Signed)
Subjective:  [name] is a 65 y.o. year old very pleasant male patient who presents for/with See problem oriented charting ROS- see ROS below  Past Medical History-  Patient Active Problem List   Diagnosis Date Noted  . Prostate cancer ([place]) 06/18/2011    Priority: High  . Gout 12/27/2013    Priority: Medium  . Essential hypertension, benign 09/11/2012    Priority: Medium  . Hyperlipidemia 12/30/2006    Priority: Medium  . Left groin pain 12/24/2012    Priority: Low  . ANKLE PAIN, CHRONIC 05/01/2009    Priority: Low  . Blood in stool 10/11/2008    Priority: Low  . Osteopenia 08/15/2008    Priority: Low  . VARICOSE VEINS LOWER EXTREMITIES W/INFLAMMATION 11/01/2007    Priority: Low  . COLONIC POLYPS, HX OF 12/30/2006    Priority: Low    Medications- reviewed and updated Current Outpatient Prescriptions  Medication Sig Dispense Refill  . aspirin 325 MG tablet Take 325 mg by mouth daily.    . Cholecalciferol (VITAMIN D3) 2000 UNITS TABS Take 5,000 Units by mouth daily.     . Coenzyme Q10 (CO Q 10) 100 MG CAPS Take 100 mg by mouth daily.    Marland Kitchen losartan (COZAAR) 100 MG tablet TAKE 1 TABLET ONCE DAILY. 90 tablet 3  . Menaquinone-7 (VITAMIN K2 PO) Take 180 mcg by mouth daily. Pt takes 90 mcg tablets- 2 po daily    . Omega-3 Fatty Acids (OMEGA-3 FISH OIL) 500 MG CAPS Take 500 mg by mouth 2 (two) times daily.    Marland Kitchen OVER THE COUNTER MEDICATION Take 1 capsule by mouth daily. Calcium/ magnesium/ phosphorus 500/250/250    . Red Yeast Rice Extract (RED YEAST RICE PO) Take by mouth.    . [DISCONTINUED] Calcium-Vitamin D (CALTRATE 600 PLUS-VIT D PO) Take by mouth 2 (two) times daily.      No current facility-administered medications for this visit.    Objective: BP 120/72 mmHg  Pulse 59  Temp(Src) 98.8 F (37.1 C)  Wt 210 lb (95.255 kg) Gen: NAD, resting comfortably Oropharynx normal with no tonsillar enlargement or erythema, no lymphadenopathy, no thyromegaly CV: RRR no  murmurs rubs or gallops Lungs: CTAB no crackles, wheeze, rhonchi Abdomen: soft/nontender/nondistended/normal bowel sounds. No rebound or guarding.  Ext: no edema Skin: warm, dry Neuro: grossly normal, moves all extremities  Assessment/Plan:  Rouble swallowing/dysphagia(solids primarily) S: started taking vitamin k2 180 mcg in januar. After he started this he had issues with swallowing. "googled it" and stated on [facility] could have difficulty swallowing then cut it down to 50 mcg and seemed to make the issues slightly better. On April the 6th he stopped the medication but since that time he continues to have the issue. Coffee goes down normally with cream. Starts to eat then feels a tightness or pressure in his throat. Does not feel food gets caught. Starts hiccuping after first few bites. If he eats quickly, throat gets tighter but slower tends to help. At lunch and dinner no hiccuping- food goes down better at lunch and dinner but still feels sensation. Tries to chew food thoroughly. Sparingly with liquid will feel issue. Has lost a lot of weight but has drastically improved diet and is intentionally losing weight- cut down on sugar and wheat, now artificial sweetener out.  ROS- no chest pain or shortness of breath. Had some reflux symptoms before over 20 lbs weight loss but none now. No unintentional weight loss, fever, night sweats A/P: refer  to GI for potential EGD. I told patient I was not clear of relation with k2 supplement and this issue but I would avoid its use for now.   Fatigue S: since weight loss has experienced some fatigue. Gets more tired when doing his activities but is not short of breath- just wants to stop sooner such as with yard work- eating high protein, low carb A/P:Add cbc, cmet, tsh, b12 with next labs  Essential hypertension, benign S: controlled on losartan 100mg  BP Readings from Last 3 Encounters:  08/23/15 120/72  06/11/15 129/76  03/28/15 144/80   A/P:Continue current meds:  But reduce dose to 50mg  to see if can tolerate lower dose weight weight loss. Follow up in about a month   Approximately one month. Return precautions advised.   Orders Placed This Encounter  Procedures  . PSA    Standing Status: Future     Number of Occurrences:      Standing Expiration Date: 08/22/2016  . CBC    [place]    Standing Status: Future     Number of Occurrences:      Standing Expiration Date: 08/22/2016  . Comprehensive metabolic panel    [place]    Standing Status: Future     Number of Occurrences:      Standing Expiration Date: 08/22/2016  . TSH    [place]    Standing Status: Future     Number of Occurrences:      Standing Expiration Date: 08/22/2016  . Vitamin B12    Standing Status: Future     Number of Occurrences:      Standing Expiration Date: 08/22/2016  . Ambulatory referral to Gastroenterology    Referral Priority:  Routine    Referral Type:  Consultation    Referral Reason:  Specialty Services Required    Number of Visits Requested:  1    Meds ordered this encounter  Medications  . aspirin 81 MG tablet    Sig: Take 81 mg by mouth daily.   The duration of face-to-face time during this visit was 35 minutes. Greater than 50% of this time was spent in counseling, explanation of diagnosis, planning of further management, and/or coordination of care.     Garret Reddish, MD

## 2015-08-24 ENCOUNTER — Telehealth: Payer: Self-pay | Admitting: Gastroenterology

## 2015-08-24 ENCOUNTER — Telehealth: Payer: Self-pay | Admitting: Family Medicine

## 2015-08-24 NOTE — Telephone Encounter (Signed)
Spoke w/pt.  Offered next available with extender and Pt. Declined.  Sch'd with Dr. Fuller Plan for 10-17-15

## 2015-08-24 NOTE — Addendum Note (Signed)
Addended by: Marin Olp on: 08/24/2015 09:49 AM   Modules accepted: Orders

## 2015-08-24 NOTE — Telephone Encounter (Signed)
Can offer a pm slot to APP schedule for next week

## 2015-08-24 NOTE — Telephone Encounter (Signed)
Office not faxed, no labs were obtained yesterday.

## 2015-08-24 NOTE — Telephone Encounter (Signed)
Pt call and said he could not get an appt until late June with [facility] so he contacted [facility] in [place] [phone] and they can see him sooner  Spoke with Butch Penny at the office     Dr  Vira Agar at the above office need office notes and labs from the last visit faxed over to them [phone]

## 2015-08-27 ENCOUNTER — Ambulatory Visit (INDEPENDENT_AMBULATORY_CARE_PROVIDER_SITE_OTHER)
Admission: RE | Admit: 2015-08-27 | Discharge: 2015-08-27 | Disposition: A | Discharge status: Home / Self Care | Payer: Medicare Other | Source: Ambulatory Visit | Attending: Family Medicine | Admitting: Family Medicine

## 2015-08-27 DIAGNOSIS — M85851 Other specified disorders of bone density and structure, right thigh: Secondary | ICD-10-CM

## 2015-08-29 ENCOUNTER — Encounter: Payer: Self-pay | Admitting: Family Medicine

## 2015-08-29 ENCOUNTER — Ambulatory Visit: Payer: Medicare Other | Admitting: Family Medicine

## 2015-08-30 ENCOUNTER — Telehealth: Payer: Self-pay | Admitting: Family Medicine

## 2015-08-30 ENCOUNTER — Other Ambulatory Visit: Payer: Self-pay | Admitting: Family Medicine

## 2015-08-30 DIAGNOSIS — E785 Hyperlipidemia, unspecified: Secondary | ICD-10-CM

## 2015-08-30 NOTE — Telephone Encounter (Signed)
Lab ordered.

## 2015-08-30 NOTE — Telephone Encounter (Signed)
Pt would like a lipid panel done tomorrow 08/31/15 .May I have a order please and he said he will pay for the test

## 2015-08-31 ENCOUNTER — Encounter: Payer: Self-pay | Admitting: Family Medicine

## 2015-08-31 ENCOUNTER — Other Ambulatory Visit (INDEPENDENT_AMBULATORY_CARE_PROVIDER_SITE_OTHER): Payer: Medicare Other

## 2015-08-31 DIAGNOSIS — E785 Hyperlipidemia, unspecified: Secondary | ICD-10-CM | POA: Diagnosis not present

## 2015-08-31 DIAGNOSIS — R6889 Other general symptoms and signs: Secondary | ICD-10-CM

## 2015-08-31 DIAGNOSIS — C61 Malignant neoplasm of prostate: Secondary | ICD-10-CM | POA: Diagnosis not present

## 2015-08-31 DIAGNOSIS — R739 Hyperglycemia, unspecified: Secondary | ICD-10-CM | POA: Diagnosis not present

## 2015-08-31 DIAGNOSIS — I10 Essential (primary) hypertension: Secondary | ICD-10-CM

## 2015-08-31 DIAGNOSIS — R5383 Other fatigue: Secondary | ICD-10-CM | POA: Diagnosis not present

## 2015-08-31 LAB — COMPREHENSIVE METABOLIC PANEL
ALBUMIN: 3.4 g/dL — AB (ref 3.5–5.2)
ALK PHOS: 36 U/L — AB (ref 39–117)
ALT: 12 U/L (ref 0–53)
AST: 12 U/L (ref 0–37)
BUN: 11 mg/dL (ref 6–23)
CHLORIDE: 105 meq/L (ref 96–112)
CO2: 29 mEq/L (ref 19–32)
Calcium: 8.9 mg/dL (ref 8.4–10.5)
Creatinine, Ser: 0.75 mg/dL (ref 0.40–1.50)
GFR: 110.64 mL/min (ref >60.00)
GLUCOSE: 93 mg/dL (ref 70–99)
POTASSIUM: 4.6 meq/L (ref 3.5–5.1)
SODIUM: 141 meq/L (ref 135–145)
TOTAL PROTEIN: 5.7 g/dL — AB (ref 6.0–8.3)
Total Bilirubin: 0.7 mg/dL (ref 0.2–1.2)

## 2015-08-31 LAB — CBC
HEMATOCRIT: 43.1 % (ref 39.0–52.0)
HEMOGLOBIN: 14.4 g/dL (ref 13.0–17.0)
MCHC: 33.5 g/dL (ref 30.0–36.0)
MCV: 86.1 fl (ref 78.0–100.0)
Platelets: 231 10*3/uL (ref 150.0–400.0)
RBC: 5.01 Mil/uL (ref 4.22–5.81)
RDW: 15.1 % (ref 11.5–15.5)
WBC: 6 10*3/uL (ref 4.0–10.5)

## 2015-08-31 LAB — LIPID PANEL
CHOL/HDL RATIO: 4
Cholesterol: 170 mg/dL (ref 0–200)
HDL: 38.5 mg/dL — AB (ref >39.00)
LDL Cholesterol: 114 mg/dL — ABNORMAL HIGH (ref 0–99)
NONHDL: 131.76
TRIGLYCERIDES: 87 mg/dL (ref 0.0–149.0)
VLDL: 17.4 mg/dL (ref 0.0–40.0)

## 2015-08-31 LAB — HEMOGLOBIN A1C: Hgb A1c MFr Bld: 5.5 % (ref 4.6–6.5)

## 2015-08-31 LAB — VITAMIN B12: Vitamin B-12: 380 pg/mL (ref 211–911)

## 2015-08-31 LAB — TSH: TSH: 2.04 u[IU]/mL (ref 0.35–4.50)

## 2015-08-31 LAB — PSA: PSA: 0.01 ng/mL — ABNORMAL LOW (ref 0.10–4.00)

## 2015-09-03 ENCOUNTER — Other Ambulatory Visit: Payer: Self-pay | Admitting: Family Medicine

## 2015-09-03 DIAGNOSIS — C61 Malignant neoplasm of prostate: Secondary | ICD-10-CM

## 2015-09-03 DIAGNOSIS — I10 Essential (primary) hypertension: Secondary | ICD-10-CM

## 2015-09-10 DIAGNOSIS — R1310 Dysphagia, unspecified: Secondary | ICD-10-CM | POA: Diagnosis not present

## 2015-09-11 ENCOUNTER — Other Ambulatory Visit: Payer: Self-pay | Admitting: Unknown Physician Specialty

## 2015-09-11 DIAGNOSIS — R1310 Dysphagia, unspecified: Secondary | ICD-10-CM

## 2015-09-14 ENCOUNTER — Encounter: Payer: Self-pay | Admitting: *Deleted

## 2015-09-14 ENCOUNTER — Encounter: Payer: Self-pay | Admitting: Family Medicine

## 2015-09-14 ENCOUNTER — Ambulatory Visit
Admission: RE | Admit: 2015-09-14 | Discharge: 2015-09-14 | Disposition: A | Discharge status: Home / Self Care | Payer: Medicare Other | Source: Ambulatory Visit | Attending: Unknown Physician Specialty | Admitting: Unknown Physician Specialty

## 2015-09-14 DIAGNOSIS — R1310 Dysphagia, unspecified: Secondary | ICD-10-CM | POA: Diagnosis not present

## 2015-09-14 DIAGNOSIS — K229 Disease of esophagus, unspecified: Secondary | ICD-10-CM | POA: Diagnosis not present

## 2015-09-14 DIAGNOSIS — R933 Abnormal findings on diagnostic imaging of other parts of digestive tract: Secondary | ICD-10-CM | POA: Diagnosis not present

## 2015-09-17 ENCOUNTER — Ambulatory Visit: Payer: Medicare Other | Admitting: Certified Registered"

## 2015-09-17 ENCOUNTER — Encounter: Payer: Self-pay | Admitting: *Deleted

## 2015-09-17 ENCOUNTER — Other Ambulatory Visit: Payer: Self-pay | Admitting: Unknown Physician Specialty

## 2015-09-17 ENCOUNTER — Ambulatory Visit
Admission: RE | Admit: 2015-09-17 | Discharge: 2015-09-17 | Disposition: A | Discharge status: Home / Self Care | Payer: Medicare Other | Source: Ambulatory Visit | Attending: Unknown Physician Specialty | Admitting: Unknown Physician Specialty

## 2015-09-17 ENCOUNTER — Ambulatory Visit: Payer: Medicare Other

## 2015-09-17 ENCOUNTER — Encounter
Admission: RE | Disposition: A | Discharge status: Home / Self Care | Payer: Self-pay | Source: Ambulatory Visit | Attending: Unknown Physician Specialty

## 2015-09-17 DIAGNOSIS — E785 Hyperlipidemia, unspecified: Secondary | ICD-10-CM | POA: Insufficient documentation

## 2015-09-17 DIAGNOSIS — Z86010 Personal history of colonic polyps: Secondary | ICD-10-CM | POA: Insufficient documentation

## 2015-09-17 DIAGNOSIS — I129 Hypertensive chronic kidney disease with stage 1 through stage 4 chronic kidney disease, or unspecified chronic kidney disease: Secondary | ICD-10-CM | POA: Diagnosis not present

## 2015-09-17 DIAGNOSIS — Z7982 Long term (current) use of aspirin: Secondary | ICD-10-CM | POA: Insufficient documentation

## 2015-09-17 DIAGNOSIS — Z86718 Personal history of other venous thrombosis and embolism: Secondary | ICD-10-CM | POA: Insufficient documentation

## 2015-09-17 DIAGNOSIS — K229 Disease of esophagus, unspecified: Secondary | ICD-10-CM | POA: Diagnosis not present

## 2015-09-17 DIAGNOSIS — I10 Essential (primary) hypertension: Secondary | ICD-10-CM | POA: Diagnosis not present

## 2015-09-17 DIAGNOSIS — K228 Other specified diseases of esophagus: Secondary | ICD-10-CM | POA: Diagnosis not present

## 2015-09-17 DIAGNOSIS — C159 Malignant neoplasm of esophagus, unspecified: Secondary | ICD-10-CM

## 2015-09-17 DIAGNOSIS — K449 Diaphragmatic hernia without obstruction or gangrene: Secondary | ICD-10-CM | POA: Diagnosis not present

## 2015-09-17 DIAGNOSIS — Z8371 Family history of colonic polyps: Secondary | ICD-10-CM | POA: Insufficient documentation

## 2015-09-17 DIAGNOSIS — C155 Malignant neoplasm of lower third of esophagus: Secondary | ICD-10-CM | POA: Diagnosis not present

## 2015-09-17 DIAGNOSIS — Z8546 Personal history of malignant neoplasm of prostate: Secondary | ICD-10-CM | POA: Diagnosis not present

## 2015-09-17 DIAGNOSIS — Z79899 Other long term (current) drug therapy: Secondary | ICD-10-CM | POA: Diagnosis not present

## 2015-09-17 DIAGNOSIS — I739 Peripheral vascular disease, unspecified: Secondary | ICD-10-CM | POA: Diagnosis not present

## 2015-09-17 DIAGNOSIS — N189 Chronic kidney disease, unspecified: Secondary | ICD-10-CM | POA: Diagnosis not present

## 2015-09-17 HISTORY — PX: ESOPHAGOGASTRODUODENOSCOPY (EGD) WITH PROPOFOL: SHX5813

## 2015-09-17 HISTORY — DX: Malignant neoplasm of esophagus, unspecified: C15.9

## 2015-09-17 LAB — HEPATIC FUNCTION PANEL
ALBUMIN: 3.6 g/dL (ref 3.5–5.0)
ALK PHOS: 43 U/L (ref 38–126)
ALT: 14 U/L — AB (ref 17–63)
AST: 17 U/L (ref 15–41)
Bilirubin, Direct: 0.1 mg/dL (ref 0.1–0.5)
Indirect Bilirubin: 0.6 mg/dL (ref 0.3–0.9)
TOTAL PROTEIN: 6.4 g/dL — AB (ref 6.5–8.1)
Total Bilirubin: 0.7 mg/dL (ref 0.3–1.2)

## 2015-09-17 LAB — BASIC METABOLIC PANEL
ANION GAP: 4 — AB (ref 5–15)
BUN: 14 mg/dL (ref 6–20)
CHLORIDE: 107 mmol/L (ref 101–111)
CO2: 28 mmol/L (ref 22–32)
Calcium: 8.6 mg/dL — ABNORMAL LOW (ref 8.9–10.3)
Creatinine, Ser: 0.82 mg/dL (ref 0.61–1.24)
GFR calc non Af Amer: >60 mL/min (ref >60)
Glucose, Bld: 104 mg/dL — ABNORMAL HIGH (ref 65–99)
POTASSIUM: 3.5 mmol/L (ref 3.5–5.1)
SODIUM: 139 mmol/L (ref 135–145)

## 2015-09-17 LAB — CBC
HEMATOCRIT: 42 % (ref 40.0–52.0)
HEMOGLOBIN: 14.2 g/dL (ref 13.0–18.0)
MCH: 28.9 pg (ref 26.0–34.0)
MCHC: 33.7 g/dL (ref 32.0–36.0)
MCV: 85.6 fL (ref 80.0–100.0)
Platelets: 219 10*3/uL (ref 150–440)
RBC: 4.9 MIL/uL (ref 4.40–5.90)
RDW: 14.4 % (ref 11.5–14.5)
WBC: 9 10*3/uL (ref 3.8–10.6)

## 2015-09-17 SURGERY — ESOPHAGOGASTRODUODENOSCOPY (EGD) WITH PROPOFOL
Anesthesia: General

## 2015-09-17 MED ORDER — SODIUM CHLORIDE 0.9 % IV SOLN: INTRAVENOUS | Status: DC

## 2015-09-17 MED ORDER — SODIUM CHLORIDE 0.9 % IV SOLN
INTRAVENOUS | Status: DC | PRN
  Administered 2015-09-17: 13:00:00 via INTRAVENOUS

## 2015-09-17 MED ORDER — PROPOFOL 10 MG/ML IV BOLUS
INTRAVENOUS | Status: DC | PRN
  Administered 2015-09-17 (×3): 20 mg via INTRAVENOUS
  Administered 2015-09-17: 100 mg via INTRAVENOUS
  Administered 2015-09-17 (×2): 20 mg via INTRAVENOUS

## 2015-09-17 MED ORDER — SODIUM CHLORIDE 0.9 % IV SOLN
INTRAVENOUS | Status: DC
  Administered 2015-09-17: 13:00:00 via INTRAVENOUS

## 2015-09-17 NOTE — Anesthesia Preprocedure Evaluation (Signed)
Anesthesia Evaluation  Patient identified by MRN, date of birth, ID band Patient awake    Reviewed: Allergy & Precautions, H&P , NPO status , Patient's Chart, lab work & pertinent test results, reviewed documented beta blocker date and time   Airway Mallampati: III   Neck ROM: full    Dental  (+) Poor Dentition, Teeth Intact   Pulmonary neg pulmonary ROS,    Pulmonary exam normal        Cardiovascular hypertension, + Peripheral Vascular Disease  negative cardio ROS Normal cardiovascular exam Rate:Normal     Neuro/Psych negative neurological ROS  negative psych ROS   GI/Hepatic negative GI ROS, Neg liver ROS,   Endo/Other  negative endocrine ROS  Renal/GU CRFnegative Renal ROS  negative genitourinary   Musculoskeletal   Abdominal   Peds  Hematology negative hematology ROS (+)   Anesthesia Other Findings Past Medical History:   Obesity                                                      Hyperlipidemia                                               Allergy                                                      Diverticulosis                                               Hemorrhoids                                                  Hx of adenomatous colonic polyps                             DVT (deep venous thrombosis) (HCC)                             Comment:1990s   Prostate cancer ([place])                           06/18/11        Comment:adenocarcinoma,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,PSA=4.52   Rhinitis                                                     Prostate cancer ([place])  Hypertension                                                 DIVERTICULOSIS, COLON                           12/30/2006       Comment:Qualifier: Diagnosis of  By: Rogue Bussing CMA,               Jacqualynn     Chronic kidney disease                                         Comment:kidney stone   Hernia, incisional                                          Past Surgical History:   EYE SURGERY                                                     Comment:as child    fx l tibia,fibula with external fixator          04-24-2008   remove fixator                                                varicocele repair                                               Comment:1990's   bilateral vein ligation                                         Comment:varicose veins    PROSTATECTOMY                                    2013           Comment:Open radical prostatecomy   FRACTURE SURGERY                                 2009         COLONOSCOPY                                      09-2008       POLYPECTOMY  09-2008     BMI    Body Mass Index   27.71 kg/m 2     Reproductive/Obstetrics                             Anesthesia Physical Anesthesia Plan  ASA: III  Anesthesia Plan: General   Post-op Pain Management:    Induction:   Airway Management Planned:   Additional Equipment:   Intra-op Plan:   Post-operative Plan:   Informed Consent: I have reviewed the patients History and Physical, chart, labs and discussed the procedure including the risks, benefits and alternatives for the proposed anesthesia with the patient or authorized representative who has indicated his/her understanding and acceptance.   Dental Advisory Given  Plan Discussed with: CRNA  Anesthesia Plan Comments:         Anesthesia Quick Evaluation

## 2015-09-17 NOTE — Op Note (Signed)
[facility] Patient Name: [name] Procedure Date: 09/17/2015 1:05 PM MRN: [id number] Account #: [id number] Date of Birth: 31-Jul-1950 Admit Type: Outpatient Age: 65 Room: [facility] ENDO ROOM 1 Gender: Male Note Status: Finalized Procedure:            Upper GI endoscopy Indications:          Dysphagia Providers:            Manya Silvas, MD Referring MD:         Garret Reddish, MD (Referring MD) Medicines:            Propofol per Anesthesia Complications:        No immediate complications. Procedure:            Pre-Anesthesia Assessment:                       - After reviewing the risks and benefits, the patient                        was deemed in satisfactory condition to undergo the                        procedure.                       After obtaining informed consent, the endoscope was                        passed under direct vision. Throughout the procedure,                        the patient's blood pressure, pulse, and oxygen                        saturations were monitored continuously. The Endoscope                        was introduced through the mouth, and advanced to the                        second part of duodenum. The upper GI endoscopy was                        accomplished without difficulty. The patient tolerated                        the procedure well. Findings:      A large, fungating irregular circumferential and ulcerating mass with no       recent bleeding and no stigmata of recent bleeding was found in the       lower third of the esophagus, 30 cm from the incisors. The mass was       partially obstructing and circumferential. Biopsies were taken with a       cold forceps for histology.      A small hiatal hernia was present.      The stomach was normal.      The examined duodenum was normal. 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 Biopsied.                       - Small hiatal hernia.                       - Normal stomach.                       - Normal examined duodenum. Recommendation:       - Await pathology results.                       - The findings and recommendations were discussed with                        the patient's family. Need CT scan and thoracic or                        general surgeon. Manya Silvas, MD 09/17/2015 1:27:21 PM This report has been signed electronically. Number of Addenda: 0 Note Initiated On: 09/17/2015 1:05 PM      [facility]

## 2015-09-17 NOTE — H&P (Signed)
Primary Care Physician:  Garret Reddish, MD Primary Gastroenterologist:  Dr. Vira Agar  Pre-Procedure History & Physical: HPI:  [name] is a 65 y.o. male is here for an endoscopy.   Past Medical History  Diagnosis Date  . Obesity   . Hyperlipidemia   . Allergy   . Diverticulosis   . Hemorrhoids   . Hx of adenomatous colonic polyps   . DVT (deep venous thrombosis) ([place])     1990s  . Prostate cancer ([place]) 06/18/11    adenocarcinoma,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,PSA=4.52  . Rhinitis   . Prostate cancer ([place])   . Hypertension   . DIVERTICULOSIS, COLON 12/30/2006    Qualifier: Diagnosis of  By: Rogue Bussing CMA, Maryann Alar    . Chronic kidney disease     kidney stone  . Hernia, incisional     Past Surgical History  Procedure Laterality Date  . Eye surgery      as child   . Fx l tibia,fibula with external fixator  04-24-2008  . Remove fixator    . Varicocele repair      1990's  . Bilateral vein ligation      varicose veins   . Prostatectomy  2013    Open radical prostatecomy  . Fracture surgery  2009  . Colonoscopy  09-2008  . Polypectomy  09-2008    Prior to Admission medications   Medication Sig Start Date End Date Taking? Authorizing Provider  losartan (COZAAR) 100 MG tablet TAKE 1 TABLET ONCE DAILY. 08/15/15  Yes Marin Olp, MD  aspirin 81 MG tablet Take 81 mg by mouth daily.    Historical Provider, MD  Cholecalciferol (VITAMIN D3) 2000 UNITS TABS Take 5,000 Units by mouth daily.     Historical Provider, MD  Coenzyme Q10 (CO Q 10) 100 MG CAPS Take 100 mg by mouth daily.    Historical Provider, MD  Omega-3 Fatty Acids (OMEGA-3 FISH OIL) 500 MG CAPS Take 500 mg by mouth 2 (two) times daily.    Historical Provider, MD  OVER THE COUNTER MEDICATION Take 1 capsule by mouth daily. Calcium/ magnesium/ phosphorus 500/250/250    Historical Provider, MD  Red Yeast Rice Extract (RED YEAST RICE PO) Take by mouth.    Historical Provider, MD    Allergies as of 09/14/2015 - Review  Complete 09/14/2015  Allergen Reaction Noted  . Adhesive [tape]  11/20/2011  . Fentanyl    . Latex  11/20/2011  . Levofloxacin  11/20/2011  . Other Other (See Comments) 05/28/2015    Family History  Problem Relation Age of Onset  . Diabetes Father   . Colon polyps Father   . Stroke Father     48 former smoker  . Coronary artery disease Maternal Grandfather     67 never smoker  . Stroke Maternal Grandfather   . Cancer Paternal Grandmother     pancreatic, breast  . Transient ischemic attack Mother     6, 78  . Colon cancer Neg Hx   . Esophageal cancer Neg Hx   . Rectal cancer Neg Hx   . Stomach cancer Neg Hx     Social History   Social History  . Marital Status: Married    Spouse Name: N/A  . Number of Children: N/A  . Years of Education: N/A   Occupational History  . retired    Social History Main Topics  . Smoking status: Never Smoker   . Smokeless tobacco: Never Used  . Alcohol Use: 0.0 oz/week  0 Standard drinks or equivalent per week     Comment: occasional  . Drug Use: No  . Sexual Activity: Not on file   Other Topics Concern  . Not on file   Social History Narrative   Married. No kids. No pets.       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 Neck:  Supple; no masses or thyromegaly. Lungs:  Clear throughout to auscultation.    Heart:  Regular rate and rhythm. Abdomen:  Soft, nontender and nondistended. Normal bowel sounds, without guarding, and without rebound.   Neurologic:  Alert and  oriented x4;  grossly normal neurologically.  Impression/Plan: [name] is here for an endoscopy to be performed for  esophageal mass on barium swallow  Risks, benefits, limitations, and alternatives regarding  endoscopy have been reviewed with the patient.  Questions have been answered.  All parties agreeable.   Gaylyn Cheers, MD  09/17/2015, 1:05 PM

## 2015-09-17 NOTE — Transfer of Care (Signed)
Immediate Anesthesia Transfer of Care Note  Patient: [name]  Procedure(s) Performed: Procedure(s): ESOPHAGOGASTRODUODENOSCOPY (EGD) WITH PROPOFOL (N/A)  Patient Location: PACU and Endoscopy Unit  Anesthesia Type:General  Level of Consciousness: awake, alert  and oriented  Airway & Oxygen Therapy: Patient Spontanous Breathing and Patient connected to nasal cannula oxygen  Post-op Assessment: Report given to RN and Post -op Vital signs reviewed and stable  Post vital signs: Reviewed and stable  Last Vitals:  Filed Vitals:   09/17/15 1229 09/17/15 1328  BP: 130/83 105/64  Pulse: 76 70  Temp: 36.7 C 36.7 C  Resp: 16 21    Last Pain: There were no vitals filed for this visit.       Complications: No apparent anesthesia complications

## 2015-09-18 ENCOUNTER — Encounter: Payer: Self-pay | Admitting: Family Medicine

## 2015-09-18 ENCOUNTER — Ambulatory Visit
Admission: RE | Admit: 2015-09-18 | Discharge: 2015-09-18 | Disposition: A | Discharge status: Home / Self Care | Payer: Medicare Other | Source: Ambulatory Visit | Attending: Unknown Physician Specialty | Admitting: Unknown Physician Specialty

## 2015-09-18 DIAGNOSIS — R938 Abnormal findings on diagnostic imaging of other specified body structures: Secondary | ICD-10-CM | POA: Insufficient documentation

## 2015-09-18 DIAGNOSIS — C159 Malignant neoplasm of esophagus, unspecified: Secondary | ICD-10-CM

## 2015-09-18 HISTORY — DX: Malignant neoplasm of esophagus, unspecified: C15.9

## 2015-09-18 LAB — SURGICAL PATHOLOGY

## 2015-09-18 MED ORDER — IOPAMIDOL (ISOVUE-300) INJECTION 61%
100.0000 mL | Freq: Once | INTRAVENOUS | Status: AC | PRN
  Administered 2015-09-18: 100 mL via INTRAVENOUS

## 2015-09-20 NOTE — Anesthesia Postprocedure Evaluation (Signed)
Anesthesia Post Note  Patient: [name]  Procedure(s) Performed: Procedure(s) (LRB): ESOPHAGOGASTRODUODENOSCOPY (EGD) WITH PROPOFOL (N/A)  Patient location during evaluation: PACU Anesthesia Type: General Level of consciousness: awake and alert Pain management: pain level controlled Vital Signs Assessment: post-procedure vital signs reviewed and stable Respiratory status: spontaneous breathing, nonlabored ventilation, respiratory function stable and patient connected to nasal cannula oxygen Cardiovascular status: blood pressure returned to baseline and stable Postop Assessment: no signs of nausea or vomiting Anesthetic complications: no    Last Vitals:  Filed Vitals:   09/17/15 1358 09/17/15 1408  BP: 133/88 150/87  Pulse: 61 67  Temp:    Resp: 18 16    Last Pain: There were no vitals filed for this visit.               Molli Barrows

## 2015-09-21 ENCOUNTER — Other Ambulatory Visit: Payer: Medicare Other

## 2015-09-21 DIAGNOSIS — C154 Malignant neoplasm of middle third of esophagus: Secondary | ICD-10-CM | POA: Diagnosis not present

## 2015-09-21 DIAGNOSIS — Z79899 Other long term (current) drug therapy: Secondary | ICD-10-CM | POA: Diagnosis not present

## 2015-09-21 DIAGNOSIS — K228 Other specified diseases of esophagus: Secondary | ICD-10-CM | POA: Diagnosis not present

## 2015-09-21 DIAGNOSIS — M8580 Other specified disorders of bone density and structure, unspecified site: Secondary | ICD-10-CM | POA: Diagnosis not present

## 2015-09-21 DIAGNOSIS — Z7982 Long term (current) use of aspirin: Secondary | ICD-10-CM | POA: Diagnosis not present

## 2015-09-21 DIAGNOSIS — I899 Noninfective disorder of lymphatic vessels and lymph nodes, unspecified: Secondary | ICD-10-CM | POA: Diagnosis not present

## 2015-09-21 DIAGNOSIS — E7800 Pure hypercholesterolemia, unspecified: Secondary | ICD-10-CM | POA: Diagnosis not present

## 2015-09-21 DIAGNOSIS — I898 Other specified noninfective disorders of lymphatic vessels and lymph nodes: Secondary | ICD-10-CM | POA: Diagnosis not present

## 2015-09-21 DIAGNOSIS — I10 Essential (primary) hypertension: Secondary | ICD-10-CM | POA: Diagnosis not present

## 2015-09-21 DIAGNOSIS — C155 Malignant neoplasm of lower third of esophagus: Secondary | ICD-10-CM | POA: Diagnosis not present

## 2015-09-21 DIAGNOSIS — K449 Diaphragmatic hernia without obstruction or gangrene: Secondary | ICD-10-CM | POA: Diagnosis not present

## 2015-09-27 ENCOUNTER — Encounter: Payer: Self-pay | Admitting: Family Medicine

## 2015-09-27 ENCOUNTER — Ambulatory Visit (INDEPENDENT_AMBULATORY_CARE_PROVIDER_SITE_OTHER): Payer: Medicare Other | Admitting: Family Medicine

## 2015-09-27 VITALS — BP 110/82 | HR 73 | Wt 201.0 lb

## 2015-09-27 DIAGNOSIS — E785 Hyperlipidemia, unspecified: Secondary | ICD-10-CM

## 2015-09-27 DIAGNOSIS — I10 Essential (primary) hypertension: Secondary | ICD-10-CM

## 2015-09-27 DIAGNOSIS — C159 Malignant neoplasm of esophagus, unspecified: Secondary | ICD-10-CM | POA: Diagnosis not present

## 2015-09-27 NOTE — Assessment & Plan Note (Signed)
S: controlled. Despite reduction to losartan 50mg   BP Readings from Last 3 Encounters:  09/27/15 110/82  09/17/15 150/87  08/23/15 120/72  A/P:Continue current meds:  Doing well. Home readings in 120s 130s. Discussed goal <140/90 and likely keep SBP over 100- call me if this changes. We did nto paln follow up but am available as he goes through chemo/radiation for esophageal cancer

## 2015-09-27 NOTE — Assessment & Plan Note (Signed)
S: elevated cardiac risk but improved on red yeast rice. With swallowing difficulties is going to hold red yeast rice as well due to pill size.  A/P: i also discussed holding aspirin due to possible increased bleeding risk when undergoing radiation- he is going to get [facility]'s opinion. He will hold all meds except aspirin potentially and losartan 50mg .

## 2015-09-27 NOTE — Assessment & Plan Note (Addendum)
Extended counseling in regards to upcoming meetings with [facility] radiation oncology and medical oncology. Offered 2nd opinion but he is very comfortable with current group and will proceed. I advised no further weight loss effort but to continue his regular exercise.

## 2015-09-27 NOTE — Progress Notes (Signed)
Subjective:  [name] is a 65 y.o. year old very pleasant male patient who presents for/with See problem oriented charting ROS- still has trouble swallowing- not with liquids, some with larger pills. No chest pain or shortness of breath with activity- continues to exercise though some less last week with procedures/workup.see any ROS included in HPI as well.   Past Medical History-  Patient Active Problem List   Diagnosis Date Noted  . Esophageal cancer ([place]) 09/18/2015    Priority: High  . Prostate cancer ([place]) 06/18/2011    Priority: High  . Gout 12/27/2013    Priority: Medium  . Essential hypertension, benign 09/11/2012    Priority: Medium  . Hyperlipidemia 12/30/2006    Priority: Medium  . Left groin pain 12/24/2012    Priority: Low  . ANKLE PAIN, CHRONIC 05/01/2009    Priority: Low  . Blood in stool 10/11/2008    Priority: Low  . osteopenia right femur 08/15/2008    Priority: Low  . VARICOSE VEINS LOWER EXTREMITIES W/INFLAMMATION 11/01/2007    Priority: Low  . COLONIC POLYPS, HX OF 12/30/2006    Priority: Low    Medications- reviewed and updated Current Outpatient Prescriptions  Medication Sig Dispense Refill  . aspirin 81 MG tablet Take 81 mg by mouth daily. Reported on 09/27/2015    . losartan (COZAAR) 100 MG tablet TAKE 1 TABLET ONCE DAILY. (Patient taking differently: Take 1/2 tabler daily.) 90 tablet 3  . Cholecalciferol (VITAMIN D3) 2000 UNITS TABS Take 5,000 Units by mouth daily. Reported on 09/27/2015    . Coenzyme Q10 (CO Q 10) 100 MG CAPS Take 100 mg by mouth daily. Reported on 09/27/2015    . Omega-3 Fatty Acids (OMEGA-3 FISH OIL) 500 MG CAPS Take 500 mg by mouth 2 (two) times daily. Reported on 09/27/2015    . Red Yeast Rice Extract (RED YEAST RICE PO) Take by mouth. Reported on 09/27/2015    . [DISCONTINUED] Calcium-Vitamin D (CALTRATE 600 PLUS-VIT D PO) Take by mouth 2 (two) times daily.      No current facility-administered medications for this visit.     Objective: BP 110/82 mmHg  Pulse 73  Wt 201 lb (91.173 kg)  SpO2 96% Gen: NAD, resting comfortably CV: RRR no murmurs rubs or gallops Lungs: CTAB no crackles, wheeze, rhonchi Abdomen: soft/nontender/nondistended/normal bowel sounds Ext: no edema Skin: warm, dry Neuro: grossly normal, moves all extremities  Assessment/Plan:  Essential hypertension, benign S: controlled. Despite reduction to losartan 50mg   BP Readings from Last 3 Encounters:  09/27/15 110/82  09/17/15 150/87  08/23/15 120/72  A/P:Continue current meds:  Doing well. Home readings in 120s 130s. Discussed goal <140/90 and likely keep SBP over 100- call me if this changes. We did nto paln follow up but am available as he goes through chemo/radiation for esophageal cancer  Hyperlipidemia S: elevated cardiac risk but improved on red yeast rice. With swallowing difficulties is going to hold red yeast rice as well due to pill size.  A/P: i also discussed holding aspirin due to possible increased bleeding risk when undergoing radiation- he is going to get [facility] opinion. He will hold all meds except aspirin potentially and losartan 50mg .    Esophageal cancer ([place]) Extended counseling in regards to upcoming meetings with [facility] radiation oncology and medical oncology. Offered 2nd opinion but he is very comfortable with current group and will proceed. I advised no further weight loss effort but to continue his regular exercise.    The duration  of face-to-face time during this visit was 25 minutes. Greater than 50% of this time was spent in counseling, explanation of diagnosis, planning of further management, and/or coordination of care.   Return precautions advised.   Garret Reddish, MD

## 2015-09-27 NOTE — Patient Instructions (Signed)
Continue your losartan 50mg  as only pill for now firmly. Can also ask [facility] about their opinion on aspirin. Hold off on red yeast rice given size of pill.   Would try to avoid any further weight loss but what you have done so far has put you in a great position- overall labs look great. Continue exercise.

## 2015-09-28 DIAGNOSIS — C155 Malignant neoplasm of lower third of esophagus: Secondary | ICD-10-CM | POA: Diagnosis not present

## 2015-09-28 DIAGNOSIS — C159 Malignant neoplasm of esophagus, unspecified: Secondary | ICD-10-CM | POA: Diagnosis not present

## 2015-10-08 DIAGNOSIS — C159 Malignant neoplasm of esophagus, unspecified: Secondary | ICD-10-CM | POA: Diagnosis not present

## 2015-10-10 DIAGNOSIS — Z1283 Encounter for screening for malignant neoplasm of skin: Secondary | ICD-10-CM | POA: Diagnosis not present

## 2015-10-10 DIAGNOSIS — B880 Other acariasis: Secondary | ICD-10-CM | POA: Diagnosis not present

## 2015-10-10 DIAGNOSIS — X32XXXD Exposure to sunlight, subsequent encounter: Secondary | ICD-10-CM | POA: Diagnosis not present

## 2015-10-10 DIAGNOSIS — L570 Actinic keratosis: Secondary | ICD-10-CM | POA: Diagnosis not present

## 2015-10-10 DIAGNOSIS — L821 Other seborrheic keratosis: Secondary | ICD-10-CM | POA: Diagnosis not present

## 2015-10-12 DIAGNOSIS — C155 Malignant neoplasm of lower third of esophagus: Secondary | ICD-10-CM | POA: Diagnosis not present

## 2015-10-15 DIAGNOSIS — C155 Malignant neoplasm of lower third of esophagus: Secondary | ICD-10-CM | POA: Diagnosis not present

## 2015-10-15 DIAGNOSIS — Z510 Encounter for antineoplastic radiation therapy: Secondary | ICD-10-CM | POA: Diagnosis not present

## 2015-10-17 ENCOUNTER — Ambulatory Visit: Payer: Medicare Other | Admitting: Gastroenterology

## 2015-10-18 DIAGNOSIS — C155 Malignant neoplasm of lower third of esophagus: Secondary | ICD-10-CM | POA: Diagnosis not present

## 2015-10-22 DIAGNOSIS — Z5111 Encounter for antineoplastic chemotherapy: Secondary | ICD-10-CM | POA: Diagnosis not present

## 2015-10-22 DIAGNOSIS — R0781 Pleurodynia: Secondary | ICD-10-CM | POA: Diagnosis not present

## 2015-10-22 DIAGNOSIS — Y33XXXD Other specified events, undetermined intent, subsequent encounter: Secondary | ICD-10-CM | POA: Diagnosis not present

## 2015-10-22 DIAGNOSIS — Z510 Encounter for antineoplastic radiation therapy: Secondary | ICD-10-CM | POA: Diagnosis not present

## 2015-10-22 DIAGNOSIS — S2231XD Fracture of one rib, right side, subsequent encounter for fracture with routine healing: Secondary | ICD-10-CM | POA: Diagnosis not present

## 2015-10-22 DIAGNOSIS — Z79899 Other long term (current) drug therapy: Secondary | ICD-10-CM | POA: Diagnosis not present

## 2015-10-22 DIAGNOSIS — C155 Malignant neoplasm of lower third of esophagus: Secondary | ICD-10-CM | POA: Diagnosis not present

## 2015-10-22 DIAGNOSIS — Z7982 Long term (current) use of aspirin: Secondary | ICD-10-CM | POA: Diagnosis not present

## 2015-10-23 DIAGNOSIS — Z510 Encounter for antineoplastic radiation therapy: Secondary | ICD-10-CM | POA: Diagnosis not present

## 2015-10-23 DIAGNOSIS — C155 Malignant neoplasm of lower third of esophagus: Secondary | ICD-10-CM | POA: Diagnosis not present

## 2015-10-24 DIAGNOSIS — Z510 Encounter for antineoplastic radiation therapy: Secondary | ICD-10-CM | POA: Diagnosis not present

## 2015-10-24 DIAGNOSIS — C155 Malignant neoplasm of lower third of esophagus: Secondary | ICD-10-CM | POA: Diagnosis not present

## 2015-10-25 DIAGNOSIS — Z510 Encounter for antineoplastic radiation therapy: Secondary | ICD-10-CM | POA: Diagnosis not present

## 2015-10-25 DIAGNOSIS — C155 Malignant neoplasm of lower third of esophagus: Secondary | ICD-10-CM | POA: Diagnosis not present

## 2015-10-26 DIAGNOSIS — C155 Malignant neoplasm of lower third of esophagus: Secondary | ICD-10-CM | POA: Diagnosis not present

## 2015-10-26 DIAGNOSIS — Z510 Encounter for antineoplastic radiation therapy: Secondary | ICD-10-CM | POA: Diagnosis not present

## 2015-10-29 DIAGNOSIS — Z510 Encounter for antineoplastic radiation therapy: Secondary | ICD-10-CM | POA: Diagnosis not present

## 2015-10-29 DIAGNOSIS — Z7982 Long term (current) use of aspirin: Secondary | ICD-10-CM | POA: Diagnosis not present

## 2015-10-29 DIAGNOSIS — C155 Malignant neoplasm of lower third of esophagus: Secondary | ICD-10-CM | POA: Diagnosis not present

## 2015-10-29 DIAGNOSIS — H539 Unspecified visual disturbance: Secondary | ICD-10-CM | POA: Diagnosis not present

## 2015-10-29 DIAGNOSIS — Z79899 Other long term (current) drug therapy: Secondary | ICD-10-CM | POA: Diagnosis not present

## 2015-10-29 DIAGNOSIS — K5900 Constipation, unspecified: Secondary | ICD-10-CM | POA: Diagnosis not present

## 2015-10-29 DIAGNOSIS — Z5111 Encounter for antineoplastic chemotherapy: Secondary | ICD-10-CM | POA: Diagnosis not present

## 2015-10-31 DIAGNOSIS — Z510 Encounter for antineoplastic radiation therapy: Secondary | ICD-10-CM | POA: Diagnosis not present

## 2015-10-31 DIAGNOSIS — C155 Malignant neoplasm of lower third of esophagus: Secondary | ICD-10-CM | POA: Diagnosis not present

## 2015-11-01 DIAGNOSIS — C155 Malignant neoplasm of lower third of esophagus: Secondary | ICD-10-CM | POA: Diagnosis not present

## 2015-11-01 DIAGNOSIS — Z510 Encounter for antineoplastic radiation therapy: Secondary | ICD-10-CM | POA: Diagnosis not present

## 2015-11-02 DIAGNOSIS — C155 Malignant neoplasm of lower third of esophagus: Secondary | ICD-10-CM | POA: Diagnosis not present

## 2015-11-02 DIAGNOSIS — Z510 Encounter for antineoplastic radiation therapy: Secondary | ICD-10-CM | POA: Diagnosis not present

## 2015-11-05 DIAGNOSIS — Z510 Encounter for antineoplastic radiation therapy: Secondary | ICD-10-CM | POA: Diagnosis not present

## 2015-11-05 DIAGNOSIS — C155 Malignant neoplasm of lower third of esophagus: Secondary | ICD-10-CM | POA: Diagnosis not present

## 2015-11-06 DIAGNOSIS — Z79899 Other long term (current) drug therapy: Secondary | ICD-10-CM | POA: Diagnosis not present

## 2015-11-06 DIAGNOSIS — K5900 Constipation, unspecified: Secondary | ICD-10-CM | POA: Diagnosis not present

## 2015-11-06 DIAGNOSIS — C155 Malignant neoplasm of lower third of esophagus: Secondary | ICD-10-CM | POA: Diagnosis not present

## 2015-11-06 DIAGNOSIS — Z923 Personal history of irradiation: Secondary | ICD-10-CM | POA: Diagnosis not present

## 2015-11-06 DIAGNOSIS — R5383 Other fatigue: Secondary | ICD-10-CM | POA: Diagnosis not present

## 2015-11-06 DIAGNOSIS — Z9079 Acquired absence of other genital organ(s): Secondary | ICD-10-CM | POA: Diagnosis not present

## 2015-11-06 DIAGNOSIS — Z5111 Encounter for antineoplastic chemotherapy: Secondary | ICD-10-CM | POA: Diagnosis not present

## 2015-11-06 DIAGNOSIS — F419 Anxiety disorder, unspecified: Secondary | ICD-10-CM | POA: Diagnosis not present

## 2015-11-06 DIAGNOSIS — Z510 Encounter for antineoplastic radiation therapy: Secondary | ICD-10-CM | POA: Diagnosis not present

## 2015-11-06 DIAGNOSIS — Z7982 Long term (current) use of aspirin: Secondary | ICD-10-CM | POA: Diagnosis not present

## 2015-11-07 DIAGNOSIS — C155 Malignant neoplasm of lower third of esophagus: Secondary | ICD-10-CM | POA: Diagnosis not present

## 2015-11-07 DIAGNOSIS — Z510 Encounter for antineoplastic radiation therapy: Secondary | ICD-10-CM | POA: Diagnosis not present

## 2015-11-08 DIAGNOSIS — Z510 Encounter for antineoplastic radiation therapy: Secondary | ICD-10-CM | POA: Diagnosis not present

## 2015-11-08 DIAGNOSIS — C155 Malignant neoplasm of lower third of esophagus: Secondary | ICD-10-CM | POA: Diagnosis not present

## 2015-11-09 DIAGNOSIS — Z510 Encounter for antineoplastic radiation therapy: Secondary | ICD-10-CM | POA: Diagnosis not present

## 2015-11-09 DIAGNOSIS — C155 Malignant neoplasm of lower third of esophagus: Secondary | ICD-10-CM | POA: Diagnosis not present

## 2015-11-12 DIAGNOSIS — Z7982 Long term (current) use of aspirin: Secondary | ICD-10-CM | POA: Diagnosis not present

## 2015-11-12 DIAGNOSIS — C155 Malignant neoplasm of lower third of esophagus: Secondary | ICD-10-CM | POA: Diagnosis not present

## 2015-11-12 DIAGNOSIS — Z5111 Encounter for antineoplastic chemotherapy: Secondary | ICD-10-CM | POA: Diagnosis not present

## 2015-11-12 DIAGNOSIS — Z79899 Other long term (current) drug therapy: Secondary | ICD-10-CM | POA: Diagnosis not present

## 2015-11-12 DIAGNOSIS — Z510 Encounter for antineoplastic radiation therapy: Secondary | ICD-10-CM | POA: Diagnosis not present

## 2015-11-13 DIAGNOSIS — Z510 Encounter for antineoplastic radiation therapy: Secondary | ICD-10-CM | POA: Diagnosis not present

## 2015-11-13 DIAGNOSIS — C155 Malignant neoplasm of lower third of esophagus: Secondary | ICD-10-CM | POA: Diagnosis not present

## 2015-11-14 DIAGNOSIS — C155 Malignant neoplasm of lower third of esophagus: Secondary | ICD-10-CM | POA: Diagnosis not present

## 2015-11-14 DIAGNOSIS — Z510 Encounter for antineoplastic radiation therapy: Secondary | ICD-10-CM | POA: Diagnosis not present

## 2015-11-15 DIAGNOSIS — C155 Malignant neoplasm of lower third of esophagus: Secondary | ICD-10-CM | POA: Diagnosis not present

## 2015-11-15 DIAGNOSIS — Z510 Encounter for antineoplastic radiation therapy: Secondary | ICD-10-CM | POA: Diagnosis not present

## 2015-11-16 DIAGNOSIS — Z510 Encounter for antineoplastic radiation therapy: Secondary | ICD-10-CM | POA: Diagnosis not present

## 2015-11-16 DIAGNOSIS — C155 Malignant neoplasm of lower third of esophagus: Secondary | ICD-10-CM | POA: Diagnosis not present

## 2015-11-19 DIAGNOSIS — C155 Malignant neoplasm of lower third of esophagus: Secondary | ICD-10-CM | POA: Diagnosis not present

## 2015-11-19 DIAGNOSIS — Z79899 Other long term (current) drug therapy: Secondary | ICD-10-CM | POA: Diagnosis not present

## 2015-11-19 DIAGNOSIS — Z7982 Long term (current) use of aspirin: Secondary | ICD-10-CM | POA: Diagnosis not present

## 2015-11-19 DIAGNOSIS — Z510 Encounter for antineoplastic radiation therapy: Secondary | ICD-10-CM | POA: Diagnosis not present

## 2015-11-19 DIAGNOSIS — Z5111 Encounter for antineoplastic chemotherapy: Secondary | ICD-10-CM | POA: Diagnosis not present

## 2015-11-20 DIAGNOSIS — C155 Malignant neoplasm of lower third of esophagus: Secondary | ICD-10-CM | POA: Diagnosis not present

## 2015-11-20 DIAGNOSIS — Z510 Encounter for antineoplastic radiation therapy: Secondary | ICD-10-CM | POA: Diagnosis not present

## 2015-11-21 DIAGNOSIS — C155 Malignant neoplasm of lower third of esophagus: Secondary | ICD-10-CM | POA: Diagnosis not present

## 2015-11-21 DIAGNOSIS — Z510 Encounter for antineoplastic radiation therapy: Secondary | ICD-10-CM | POA: Diagnosis not present

## 2015-11-22 DIAGNOSIS — C155 Malignant neoplasm of lower third of esophagus: Secondary | ICD-10-CM | POA: Diagnosis not present

## 2015-11-22 DIAGNOSIS — Z510 Encounter for antineoplastic radiation therapy: Secondary | ICD-10-CM | POA: Diagnosis not present

## 2015-11-23 DIAGNOSIS — Z510 Encounter for antineoplastic radiation therapy: Secondary | ICD-10-CM | POA: Diagnosis not present

## 2015-11-23 DIAGNOSIS — C155 Malignant neoplasm of lower third of esophagus: Secondary | ICD-10-CM | POA: Diagnosis not present

## 2015-11-26 DIAGNOSIS — Z510 Encounter for antineoplastic radiation therapy: Secondary | ICD-10-CM | POA: Diagnosis not present

## 2015-11-26 DIAGNOSIS — C155 Malignant neoplasm of lower third of esophagus: Secondary | ICD-10-CM | POA: Diagnosis not present

## 2015-11-27 DIAGNOSIS — C155 Malignant neoplasm of lower third of esophagus: Secondary | ICD-10-CM | POA: Diagnosis not present

## 2015-11-27 DIAGNOSIS — Z510 Encounter for antineoplastic radiation therapy: Secondary | ICD-10-CM | POA: Diagnosis not present

## 2015-11-28 DIAGNOSIS — C155 Malignant neoplasm of lower third of esophagus: Secondary | ICD-10-CM | POA: Diagnosis not present

## 2015-11-28 DIAGNOSIS — Z510 Encounter for antineoplastic radiation therapy: Secondary | ICD-10-CM | POA: Diagnosis not present

## 2015-11-29 DIAGNOSIS — Z510 Encounter for antineoplastic radiation therapy: Secondary | ICD-10-CM | POA: Diagnosis not present

## 2015-11-29 DIAGNOSIS — C155 Malignant neoplasm of lower third of esophagus: Secondary | ICD-10-CM | POA: Diagnosis not present

## 2015-12-07 ENCOUNTER — Encounter: Payer: Self-pay | Admitting: Family Medicine

## 2015-12-20 DIAGNOSIS — K229 Disease of esophagus, unspecified: Secondary | ICD-10-CM | POA: Diagnosis not present

## 2015-12-20 DIAGNOSIS — Z923 Personal history of irradiation: Secondary | ICD-10-CM | POA: Diagnosis not present

## 2015-12-20 DIAGNOSIS — C155 Malignant neoplasm of lower third of esophagus: Secondary | ICD-10-CM | POA: Diagnosis not present

## 2015-12-20 DIAGNOSIS — Z9221 Personal history of antineoplastic chemotherapy: Secondary | ICD-10-CM | POA: Diagnosis not present

## 2015-12-24 ENCOUNTER — Encounter: Payer: Self-pay | Admitting: Family Medicine

## 2015-12-28 DIAGNOSIS — X32XXXD Exposure to sunlight, subsequent encounter: Secondary | ICD-10-CM | POA: Diagnosis not present

## 2015-12-28 DIAGNOSIS — D225 Melanocytic nevi of trunk: Secondary | ICD-10-CM | POA: Diagnosis not present

## 2015-12-28 DIAGNOSIS — L570 Actinic keratosis: Secondary | ICD-10-CM | POA: Diagnosis not present

## 2016-01-01 ENCOUNTER — Ambulatory Visit (INDEPENDENT_AMBULATORY_CARE_PROVIDER_SITE_OTHER): Payer: Medicare Other | Admitting: Family Medicine

## 2016-01-01 DIAGNOSIS — Z23 Encounter for immunization: Secondary | ICD-10-CM | POA: Diagnosis not present

## 2016-01-08 DIAGNOSIS — R1310 Dysphagia, unspecified: Secondary | ICD-10-CM | POA: Diagnosis not present

## 2016-01-08 DIAGNOSIS — C61 Malignant neoplasm of prostate: Secondary | ICD-10-CM | POA: Diagnosis not present

## 2016-01-08 DIAGNOSIS — I10 Essential (primary) hypertension: Secondary | ICD-10-CM | POA: Diagnosis not present

## 2016-01-08 DIAGNOSIS — Z79899 Other long term (current) drug therapy: Secondary | ICD-10-CM | POA: Diagnosis not present

## 2016-01-08 DIAGNOSIS — M109 Gout, unspecified: Secondary | ICD-10-CM | POA: Diagnosis not present

## 2016-01-08 DIAGNOSIS — C155 Malignant neoplasm of lower third of esophagus: Secondary | ICD-10-CM | POA: Diagnosis not present

## 2016-01-11 DIAGNOSIS — L03116 Cellulitis of left lower limb: Secondary | ICD-10-CM | POA: Diagnosis not present

## 2016-01-11 DIAGNOSIS — D2371 Other benign neoplasm of skin of right lower limb, including hip: Secondary | ICD-10-CM | POA: Diagnosis not present

## 2016-01-25 DIAGNOSIS — C771 Secondary and unspecified malignant neoplasm of intrathoracic lymph nodes: Secondary | ICD-10-CM | POA: Diagnosis present

## 2016-01-25 DIAGNOSIS — I959 Hypotension, unspecified: Secondary | ICD-10-CM | POA: Diagnosis not present

## 2016-01-25 DIAGNOSIS — I10 Essential (primary) hypertension: Secondary | ICD-10-CM | POA: Diagnosis present

## 2016-01-25 DIAGNOSIS — J90 Pleural effusion, not elsewhere classified: Secondary | ICD-10-CM | POA: Diagnosis not present

## 2016-01-25 DIAGNOSIS — C772 Secondary and unspecified malignant neoplasm of intra-abdominal lymph nodes: Secondary | ICD-10-CM | POA: Diagnosis not present

## 2016-01-25 DIAGNOSIS — E785 Hyperlipidemia, unspecified: Secondary | ICD-10-CM | POA: Diagnosis present

## 2016-01-25 DIAGNOSIS — J939 Pneumothorax, unspecified: Secondary | ICD-10-CM | POA: Diagnosis not present

## 2016-01-25 DIAGNOSIS — G8918 Other acute postprocedural pain: Secondary | ICD-10-CM | POA: Diagnosis not present

## 2016-01-25 DIAGNOSIS — C155 Malignant neoplasm of lower third of esophagus: Secondary | ICD-10-CM | POA: Diagnosis not present

## 2016-01-25 DIAGNOSIS — Z9221 Personal history of antineoplastic chemotherapy: Secondary | ICD-10-CM | POA: Diagnosis not present

## 2016-01-25 DIAGNOSIS — J9811 Atelectasis: Secondary | ICD-10-CM | POA: Diagnosis not present

## 2016-01-25 DIAGNOSIS — C159 Malignant neoplasm of esophagus, unspecified: Secondary | ICD-10-CM | POA: Diagnosis not present

## 2016-01-25 DIAGNOSIS — Z8546 Personal history of malignant neoplasm of prostate: Secondary | ICD-10-CM | POA: Diagnosis not present

## 2016-01-25 DIAGNOSIS — R918 Other nonspecific abnormal finding of lung field: Secondary | ICD-10-CM | POA: Diagnosis not present

## 2016-01-25 DIAGNOSIS — Z923 Personal history of irradiation: Secondary | ICD-10-CM | POA: Diagnosis not present

## 2016-01-25 DIAGNOSIS — Z4682 Encounter for fitting and adjustment of non-vascular catheter: Secondary | ICD-10-CM | POA: Diagnosis not present

## 2016-02-08 ENCOUNTER — Telehealth: Payer: Self-pay | Admitting: Family Medicine

## 2016-02-08 ENCOUNTER — Ambulatory Visit (INDEPENDENT_AMBULATORY_CARE_PROVIDER_SITE_OTHER): Payer: Medicare Other | Admitting: Family Medicine

## 2016-02-08 ENCOUNTER — Encounter: Payer: Self-pay | Admitting: Family Medicine

## 2016-02-08 VITALS — BP 128/74 | HR 98 | Temp 98.4°F | Wt 191.8 lb

## 2016-02-08 DIAGNOSIS — L89153 Pressure ulcer of sacral region, stage 3: Secondary | ICD-10-CM | POA: Diagnosis not present

## 2016-02-08 MED ORDER — CEPHALEXIN 250 MG/5ML PO SUSR: 500.0000 mg | Freq: Two times a day (BID) | ORAL | 0 refills | Status: AC

## 2016-02-08 NOTE — Telephone Encounter (Signed)
Pt has a pressure ulcer on his bottom he would like dr hunter to take a look at. Its not healing well. Pt starts his feeding tube at 4 pm, (which last 16 hrs) only appt left is 4:15 pm. Pt wants to know if ok to come in sooner than 4:15?

## 2016-02-08 NOTE — Telephone Encounter (Signed)
We don't have any other appointments available today

## 2016-02-08 NOTE — Progress Notes (Signed)
Pre visit review using our clinic review tool, if applicable. No additional management support is needed unless otherwise documented below in the visit note. 

## 2016-02-08 NOTE — Patient Instructions (Signed)
So thrilled you got into wound care  We are doing the best we can today with your treatment plan but I am not an expert in wound care. I did believe we needed to get the dead superficial skin off the top layer (there was no deeper dead/necrotic tissue thankfully) and cover for potential infection so we did both of these today  7 days of antibiotics twice a day through the J tube  Thanks for all you have done to be proactive with this

## 2016-02-08 NOTE — Progress Notes (Addendum)
Subjective:  [name] is a 65 y.o. year old very pleasant male patient who presents for/with See problem oriented charting ROS- no fever, chills ,nausea, vomiting. Doing feeds through j tube.see any ROS included in HPI as well.   Past Medical History-  Patient Active Problem List   Diagnosis Date Noted  . Esophageal cancer ([place]) 09/18/2015    Priority: High  . Prostate cancer ([place]) 06/18/2011    Priority: High  . Gout 12/27/2013    Priority: Medium  . Essential hypertension, benign 09/11/2012    Priority: Medium  . Hyperlipidemia 12/30/2006    Priority: Medium  . Left groin pain 12/24/2012    Priority: Low  . ANKLE PAIN, CHRONIC 05/01/2009    Priority: Low  . Blood in stool 10/11/2008    Priority: Low  . osteopenia right femur 08/15/2008    Priority: Low  . VARICOSE VEINS LOWER EXTREMITIES W/INFLAMMATION 11/01/2007    Priority: Low  . COLONIC POLYPS, HX OF 12/30/2006    Priority: Low    Medications- reviewed and updated Current Outpatient Prescriptions  Medication Sig Dispense Refill  . aspirin 81 MG tablet Take 81 mg by mouth daily. Reported on 09/27/2015    . Cholecalciferol (VITAMIN D3) 2000 UNITS TABS Take 5,000 Units by mouth daily. Reported on 09/27/2015    . Coenzyme Q10 (CO Q 10) 100 MG CAPS Take 100 mg by mouth daily. Reported on 09/27/2015    . losartan (COZAAR) 100 MG tablet TAKE 1 TABLET ONCE DAILY. (Patient taking differently: Take 1/2 tabler daily.) 90 tablet 3  . Omega-3 Fatty Acids (OMEGA-3 FISH OIL) 500 MG CAPS Take 500 mg by mouth 2 (two) times daily. Reported on 09/27/2015    . Red Yeast Rice Extract (RED YEAST RICE PO) Take by mouth. Reported on 09/27/2015    . cephALEXin (KEFLEX) 250 MG/5ML suspension Place 10 mLs (500 mg total) into feeding tube 2 (two) times daily. 200 mL 0   No current facility-administered medications for this visit.     Objective: BP 128/74 (BP Location: Right Arm, Patient Position: Sitting, Cuff Size: Large)   Pulse 98   Temp  98.4 F (36.9 C) (Oral)   Wt 191 lb 12.8 oz (87 kg)   SpO2 95%   BMI 25.30 kg/m  Gen: NAD, resting comfortably CV: RRR no murmurs rubs or gallops Lungs: CTAB no crackles, wheeze, rhonchi  See below for skin exam  Assessment/Plan:  Stage III decubitus ulcer S: Had a [place] esophagectomy 01/25/16 at [facility] for esophageal cancer. Perhaps October 1st nursing staff noted a decubitus ulcer. Wound care came in and took pictures and gave a barrier cream with zinc oxide . Also encouraged position changes. At first, ulceration was on just small portion of left and right buttocks near crevice. He states has gotten larger over last several days and is now as big as noted in objective. Also has a thick white area over portions of the wound which he was told was healing skin.   He took initiative to look for wound care but could not get in until 27th with [facility] in [place] but was able to get in next Tuesday with Dr. Linton Ham in [place] wound care. He was worried about ulcer worsening or him ending up in hospital if he waited. There is some erythema, warmth, but minimal pain O: starting at crease of buttocks there is a wound extending 9 cm down right buttocks and on left side approximately 3-4 cm. Wound  on left side appears to be healing in well form edges. At peak of wound and down majority of 23mm white slough/devitalized epidermis which appears to be dead/macerated skin. This tissue was debrided with 15 blade scalpel down to the dermis. appoximately 2 mm of slough were removed. Underneath there was red/pink viable tissue without any tunneling. Surrounding skin with dried white zinc oxide. Under that skin is some mild erythema particularly at the top which is blanching and somewhat warm concerning for infection.  A/P: At time of visit I told patient this was a stage II ulcer, on further thought after visit- the macerated area of dead skin represented slough so this is likely stage III though no  underlying fat was exposed. We did a simple debridement of the devitalized epidermis/slough and underneath was pink/red viable tissue. Benefits/risks and aftercare were discussed. There was some erythema in surrounding tissues that was blanchable and did not appear to be stage I ulceration. Concern was for potential mild cellulitis- opted to cover with keflex for 7 days. Very thankful patient has upcoming wound care appointment- given concern for potential infection I did not want to cover with occlusive dressing. We opted to trial wet to dry dressings but if there is much bleeding/pulling away on drys then can just do wets every 6-8 hours. Extended counseling was required about causes for ulceration, treatment, follow up indications with visit lasting from 4 50 - 6 PM.   Meds ordered this encounter  Medications  . cephALEXin (KEFLEX) 250 MG/5ML suspension    Sig: Place 10 mLs (500 mg total) into feeding tube 2 (two) times daily.    Dispense:  200 mL    Refill:  0   The duration of face-to-face time during this visit was greater than 40 minutes. Greater than 50% of this time was spent in counseling, explanation of diagnosis, planning of further management as noted above  Return precautions advised for over weekend Garret Reddish, MD

## 2016-02-12 ENCOUNTER — Encounter: Payer: Medicare Other | Attending: Internal Medicine | Admitting: Internal Medicine

## 2016-02-12 DIAGNOSIS — Z8501 Personal history of malignant neoplasm of esophagus: Secondary | ICD-10-CM | POA: Insufficient documentation

## 2016-02-12 DIAGNOSIS — I10 Essential (primary) hypertension: Secondary | ICD-10-CM | POA: Insufficient documentation

## 2016-02-12 DIAGNOSIS — Z8546 Personal history of malignant neoplasm of prostate: Secondary | ICD-10-CM | POA: Insufficient documentation

## 2016-02-12 DIAGNOSIS — L89311 Pressure ulcer of right buttock, stage 1: Secondary | ICD-10-CM | POA: Insufficient documentation

## 2016-02-12 DIAGNOSIS — E785 Hyperlipidemia, unspecified: Secondary | ICD-10-CM | POA: Insufficient documentation

## 2016-02-12 DIAGNOSIS — M109 Gout, unspecified: Secondary | ICD-10-CM | POA: Insufficient documentation

## 2016-02-12 DIAGNOSIS — L89322 Pressure ulcer of left buttock, stage 2: Secondary | ICD-10-CM | POA: Insufficient documentation

## 2016-02-13 NOTE — Progress Notes (Signed)
DORN, KARP ([id number]) Visit Report for 02/12/2016 Abuse/Suicide Risk Screen Details Patient Name: [name], [name]. Date of Service: 02/12/2016 2:15 PM Medical Record Patient Account Number: [id number] Number: Treating RN: Ahmed Prima Nov 02, 1950 (65 y.o. Other Clinician: Date of Birth/Sex: Male) Treating [name], [name] Primary Care Physician: Garret Reddish Physician/Extender: G Referring Physician: Suella Grove in Treatment: 0 Abuse/Suicide Risk Screen Items Answer ABUSE/SUICIDE RISK SCREEN: Has anyone close to you tried to hurt or harm you recentlyo No Do you feel uncomfortable with anyone in your familyo No Has anyone forced you do things that you didnot want to doo No Do you have any thoughts of harming yourselfo No Patient displays signs or symptoms of abuse and/or neglect. No Electronic Signature(s) Signed: 02/12/2016 5:14:55 PM By: Alric Quan Entered By: Alric Quan on 02/12/2016 15:22:54 [name] ([id number]) -------------------------------------------------------------------------------- Activities of Daily Living Details Patient Name: [name], [name]. Date of Service: 02/12/2016 2:15 PM Medical Record Patient Account Number: [id number] Number: Treating RN: Ahmed Prima 04-04-1951 (65 y.o. Other Clinician: Date of Birth/Sex: Male) Treating [name], [name] Primary Care Physician: Garret Reddish Physician/Extender: G Referring Physician: Suella Grove in Treatment: 0 Activities of Daily Living Items Answer Activities of Daily Living (Please select one for each item) Drive Automobile Completely Able Take Medications Completely Able Use Telephone Completely Able Care for Appearance Completely Able Use Toilet Completely Able Bath / Shower Completely Able Dress Self Completely Able Feed Self Completely Able Walk Completely Able Get In / Out Bed Completely Able Housework Not Able Prepare Meals Not Able Handle Money  Completely Able Shop for Self Need Assistance Electronic Signature(s) Signed: 02/12/2016 5:14:55 PM By: Alric Quan Entered By: Alric Quan on 02/12/2016 15:23:28 [name] ([id number]) -------------------------------------------------------------------------------- Education Assessment Details Patient Name: [name]. Date of Service: 02/12/2016 2:15 PM Medical Record Patient Account Number: [id number] Number: Treating RN: Ahmed Prima 07-22-1950 (65 y.o. Other Clinician: Date of Birth/Sex: Male) Treating [name], [place] Primary Care Physician: Garret Reddish Physician/Extender: G Referring Physician: Suella Grove in Treatment: 0 Primary Learner Assessed: Patient Learning Preferences/Education Level/Primary Language Learning Preference: Explanation, Printed Material Highest Education Level: College or Above Preferred Language: English Cognitive Barrier Assessment/Beliefs Language Barrier: No Translator Needed: No Memory Deficit: No Emotional Barrier: No Cultural/Religious Beliefs Affecting Medical No Care: Physical Barrier Assessment Impaired Vision: Yes Glasses Impaired Hearing: No Decreased Hand dexterity: No Knowledge/Comprehension Assessment Knowledge Level: High Comprehension Level: High Ability to understand written High instructions: Ability to understand verbal High instructions: Motivation Assessment Anxiety Level: Calm Cooperation: Cooperative Education Importance: Acknowledges Need Interest in Health Problems: Asks Questions Perception: Coherent Willingness to Engage in Self- High Management Activities: Readiness to Engage in Self- High Management Activities: [name], [name] ([id number]) Electronic Signature(s) Signed: 02/12/2016 5:14:55 PM By: Alric Quan Entered By: Alric Quan on 02/12/2016 15:23:50 [name], [name]  ([id number]) -------------------------------------------------------------------------------- Fall Risk Assessment Details Patient Name: [name] Date of Service: 02/12/2016 2:15 PM Medical Record Patient Account Number: [id number] Number: Treating RN: Ahmed Prima [birth year] (65 y.o. Other Clinician: Date of Birth/Sex: Male) Treating [name], [place] Primary Care Physician: Garret Reddish Physician/Extender: G Referring Physician: Suella Grove in Treatment: 0 Fall Risk Assessment Items Have you had 2 or more falls in the last 12 monthso 0 No Have you had any fall that resulted in injury in the last 12 monthso 0 No FALL RISK ASSESSMENT: History of falling - immediate or within 3 months 0 No Secondary diagnosis 15 Yes Ambulatory aid None/bed rest/wheelchair/nurse 0 No Crutches/cane/walker 0 No Furniture 0 No  IV Access/Saline Lock 0 No Gait/Training Normal/bed rest/immobile 0 No Weak 0 No Impaired 0 No Mental Status Oriented to own ability 0 Yes Electronic Signature(s) Signed: 02/12/2016 5:14:55 PM By: Alric Quan Entered By: Alric Quan on 02/12/2016 15:24:05 Hofacker, [name] ([id number]) -------------------------------------------------------------------------------- Foot Assessment Details Patient Name: [name]. Date of Service: 02/12/2016 2:15 PM Medical Record Patient Account Number: [id number] Number: Treating RN: Ahmed Prima April 04, 1951 (65 y.o. Other Clinician: Date of Birth/Sex: Male) Treating [name], [name] Primary Care Physician: Garret Reddish Physician/Extender: G Referring Physician: Suella Grove in Treatment: 0 Foot Assessment Items Site Locations + = Sensation present, - = Sensation absent, C = Callus, U = Ulcer R = Redness, W = Warmth, M = Maceration, PU = Pre-ulcerative lesion F = Fissure, S = Swelling, D = Dryness Assessment Right: Left: Other Deformity: No No Prior Foot Ulcer: No No Prior Amputation:  No No Charcot Joint: No No Ambulatory Status: Ambulatory Without Help Gait: Steady Electronic Signature(s) Signed: 02/12/2016 5:14:55 PM By: Alric Quan Entered By: Alric Quan on 02/12/2016 15:24:38 Schneck, [name] ([id number]) -------------------------------------------------------------------------------- Nutrition Risk Assessment Details Patient Name: [name]. Date of Service: 02/12/2016 2:15 PM Medical Record Patient Account Number: [id number] Number: Treating RN: Ahmed Prima 05-19-1950 (65 y.o. Other Clinician: Date of Birth/Sex: Male) Treating [name], [name] Primary Care Physician: Garret Reddish Physician/Extender: G Referring Physician: Suella Grove in Treatment: 0 Height (in): 73 Weight (lbs): 187 Body Mass Index (BMI): 24.7 Nutrition Risk Assessment Items NUTRITION RISK SCREEN: I have an illness or condition that made me change the kind and/or 2 Yes amount of food I eat I eat fewer than two meals per day 0 No I eat few fruits and vegetables, or milk products 0 No I have three or more drinks of beer, liquor or wine almost every day 0 No I have tooth or mouth problems that make it hard for me to eat 0 No I don't always have enough money to buy the food I need 0 No I eat alone most of the time 0 No I take three or more different prescribed or over-the-counter drugs a 1 Yes day Without wanting to, I have lost or gained 10 pounds in the last six 2 Yes months I am not always physically able to shop, cook and/or feed myself 0 No Nutrition Protocols Good Risk Protocol Moderate Risk Protocol Electronic Signature(s) Signed: 02/12/2016 5:14:55 PM By: Alric Quan Entered By: Alric Quan on 02/12/2016 15:24:27

## 2016-02-13 NOTE — Progress Notes (Signed)
KAMARION, CICALE ([id number]) Visit Report for 02/12/2016 Allergy List Details Patient Name: [name], [name]. Date of Service: 02/12/2016 2:15 PM Medical Record Number: [id number] Patient Account Number: [id number] Date of Birth/Sex: 08-19-1950 (65 y.o. Male) Treating RN: Ahmed Prima Primary Care Physician: Garret Reddish Other Clinician: Referring Physician: Treating Physician/Extender: Ricard Dillon Weeks in Treatment: 0 Allergies Active Allergies fentanyl levofloxacin latex animal dander Allergy Notes Electronic Signature(s) Signed: 02/12/2016 5:14:55 PM By: Alric Quan Entered By: Alric Quan on 02/12/2016 15:08:14 Herro, [name] ([id number]) -------------------------------------------------------------------------------- Arrival Information Details Patient Name: [name]. Date of Service: 02/12/2016 2:15 PM Medical Record Number: [id number] Patient Account Number: [id number] Date of Birth/Sex: [birth year] (65 y.o. Male) Treating RN: Ahmed Prima Primary Care Physician: Garret Reddish Other Clinician: Referring Physician: Treating Physician/Extender: Tito Dine in Treatment: 0 Visit Information Patient Arrived: Ambulatory Arrival Time: 15:02 Accompanied By: Hansel Feinstein Transfer Assistance: None Patient Identification Verified: Yes Secondary Verification Process Yes Completed: Patient Requires Transmission-Based No Precautions: Patient Has Alerts: No Electronic Signature(s) Signed: 02/12/2016 5:14:55 PM By: Alric Quan Entered By: Alric Quan on 02/12/2016 15:04:31 Nakata, [name] ([id number]) -------------------------------------------------------------------------------- Clinic Level of Care Assessment Details Patient Name: [name] Date of Service: 02/12/2016 2:15 PM Medical Record Number: [id number] Patient Account Number: [id number] Date of Birth/Sex: 03-17-1951 (65 y.o. Male) Treating RN: Ahmed Prima Primary Care Physician: Garret Reddish Other Clinician: Referring Physician: Treating Physician/Extender: Ricard Dillon Weeks in Treatment: 0 Clinic Level of Care Assessment Items TOOL 1 Quantity Score X - Use when EandM and Procedure is performed on INITIAL visit 1 0 ASSESSMENTS - Nursing Assessment / Reassessment X - General Physical Exam (combine w/ comprehensive assessment (listed just 1 20 below) when performed on new pt. evals) X - Comprehensive Assessment (HX, ROS, Risk Assessments, Wounds Hx, etc.) 1 25 ASSESSMENTS - Wound and Skin Assessment / Reassessment []  - Dermatologic / Skin Assessment (not related to wound area) 0 ASSESSMENTS - Ostomy and/or Continence Assessment and Care []  - Incontinence Assessment and Management 0 []  - Ostomy Care Assessment and Management (repouching, etc.) 0 PROCESS - Coordination of Care []  - Simple Patient / Family Education for ongoing care 0 X - Complex (extensive) Patient / Family Education for ongoing care 1 20 X - Staff obtains Programmer, systems, Records, Test Results / Process Orders 1 10 []  - Staff telephones HHA, Nursing Homes / Clarify orders / etc 0 []  - Routine Transfer to another Facility (non-emergent condition) 0 []  - Routine Hospital Admission (non-emergent condition) 0 X - New Admissions / Biomedical engineer / Ordering NPWT, Apligraf, etc. 1 15 []  - Emergency Hospital Admission (emergent condition) 0 PROCESS - Special Needs []  - Pediatric / Minor Patient Management 0 []  - Isolation Patient Management 0 Bee, Majed J. ([id number]) []  - Hearing / Language / Visual special needs 0 []  - Assessment of Community assistance (transportation, D/C planning, etc.) 0 []  - Additional assistance / Altered mentation 0 []  - Support Surface(s) Assessment (bed, cushion, seat, etc.) 0 INTERVENTIONS - Miscellaneous []  - External ear exam 0 []  - Patient Transfer (multiple staff / Civil Service fast streamer / Similar devices) 0 []  - Simple Staple /  Suture removal (25 or less) 0 []  - Complex Staple / Suture removal (26 or more) 0 []  - Hypo/Hyperglycemic Management (do not check if billed separately) 0 []  - Ankle / Brachial Index (ABI) - do not check if billed separately 0 Has the patient been seen at the hospital within the last three years: Yes Total Score:  90 Level Of Care: New/Established - Level 3 Electronic Signature(s) Signed: 02/12/2016 5:14:55 PM By: Alric Quan Entered By: Alric Quan on 02/12/2016 16:35:28 Gipe, [name] ([id number]) -------------------------------------------------------------------------------- Encounter Discharge Information Details Patient Name: [name] Date of Service: 02/12/2016 2:15 PM Medical Record Number: [id number] Patient Account Number: [id number] Date of Birth/Sex: August 08, 1950 (65 y.o. Male) Treating RN: Ahmed Prima Primary Care Physician: Garret Reddish Other Clinician: Referring Physician: Treating Physician/Extender: Tito Dine in Treatment: 0 Encounter Discharge Information Items Discharge Pain Level: 0 Discharge Condition: Stable Ambulatory Status: Ambulatory Discharge Destination: Home Transportation: Private Auto Accompanied By: Hansel Feinstein Schedule Follow-up Appointment: Yes Medication Reconciliation completed and provided to Patient/Care No Halona Amstutz: Provided on Clinical Summary of Care: 02/12/2016 Form Type Recipient Paper Patient DM Electronic Signature(s) Signed: 02/12/2016 4:11:32 PM By: Ruthine Dose Entered By: Ruthine Dose on 02/12/2016 16:11:32 Erwin, [name] ([id number]) -------------------------------------------------------------------------------- Lower Extremity Assessment Details Patient Name: [name]. Date of Service: 02/12/2016 2:15 PM Medical Record Number: [id number] Patient Account Number: [id number] Date of Birth/Sex: 07-16-1950 (65 y.o. Male) Treating RN: Ahmed Prima Primary Care Physician: Garret Reddish Other Clinician: Referring Physician: Treating Physician/Extender: Ricard Dillon Weeks in Treatment: 0 Electronic Signature(s) Signed: 02/12/2016 5:14:55 PM By: Alric Quan Entered By: Alric Quan on 02/12/2016 15:07:09 Messenger, [name] ([id number]) -------------------------------------------------------------------------------- Multi Wound Chart Details Patient Name: [name] Date of Service: 02/12/2016 2:15 PM Medical Record Number: [id number] Patient Account Number: [id number] Date of Birth/Sex: [birth year] (65 y.o. Male) Treating RN: Ahmed Prima Primary Care Physician: Garret Reddish Other Clinician: Referring Physician: Treating Physician/Extender: Ricard Dillon Weeks in Treatment: 0 Vital Signs Height(in): 73 Pulse(bpm): 83 Weight(lbs): 187 Blood Pressure 141/83 (mmHg): Body Mass Index(BMI): 25 Temperature(F): 98.1 Respiratory Rate 18 (breaths/min): Photos: [1:No Photos] [N/A:N/A] Wound Location: [1:Gluteus - Midline] [N/A:N/A] Wounding Event: [1:Pressure Injury] [N/A:N/A] Primary Etiology: [1:Pressure Ulcer] [N/A:N/A] Comorbid History: [1:Hypertension, Gout, Received Chemotherapy, Received Radiation] [N/A:N/A] Date Acquired: [1:02/04/2016] [N/A:N/A] Weeks of Treatment: [1:0] [N/A:N/A] Wound Status: [1:Open] [N/A:N/A] Measurements L x W x D 7x9.5x0.1 [N/A:N/A] (cm) Area (cm) : [1:52.229] [N/A:N/A] Volume (cm) : [1:5.223] [N/A:N/A] % Reduction in Area: [1:0.00%] [N/A:N/A] % Reduction in Volume: 0.00% [N/A:N/A] Classification: [1:Category/Stage II] [N/A:N/A] Exudate Amount: [1:Medium] [N/A:N/A] Exudate Type: [1:Serous] [N/A:N/A] Exudate Color: [1:amber] [N/A:N/A] Wound Margin: [1:Distinct, outline attached] [N/A:N/A] Granulation Amount: [1:Medium (34-66%)] [N/A:N/A] Granulation Quality: [1:Pink] [N/A:N/A] Necrotic Amount: [1:Medium (34-66%)] [N/A:N/A] Exposed Structures: [1:Fascia: No Fat: No Tendon: No Muscle: No  Joint: No Bone: No] [N/A:N/A] Limited to Skin Breakdown Epithelialization: None N/A N/A Periwound Skin Texture: No Abnormalities Noted N/A N/A Periwound Skin Moist: Yes N/A N/A Moisture: Periwound Skin Color: No Abnormalities Noted N/A N/A Temperature: No Abnormality N/A N/A Tenderness on Yes N/A N/A Palpation: Wound Preparation: Ulcer Cleansing: N/A N/A Rinsed/Irrigated with Saline Topical Anesthetic Applied: Other: LIDOCAINE 4% Treatment Notes Electronic Signature(s) Signed: 02/12/2016 5:14:55 PM By: Alric Quan Entered By: Alric Quan on 02/12/2016 15:39:58 [name] ([id number]) -------------------------------------------------------------------------------- [place] Details Patient Name: FRANISCO, FEDOROV Date of Service: 02/12/2016 2:15 PM Medical Record Number: [id number] Patient Account Number: [id number] Date of Birth/Sex: 10-Nov-1950 (65 y.o. Male) Treating RN: Ahmed Prima Primary Care Physician: Garret Reddish Other Clinician: Referring Physician: Treating Physician/Extender: Tito Dine in Treatment: 0 Active Inactive Nutrition Nursing Diagnoses: Imbalanced nutrition Goals: Patient/caregiver agrees to and verbalizes understanding of need to obtain nutritional consultation Date Initiated: 02/12/2016 Goal Status: Active Interventions: Assess patient nutrition upon admission and as needed per policy Notes: Orientation to the Wound Care Program  Nursing Diagnoses: Knowledge deficit related to the wound healing center program Goals: Patient/caregiver will verbalize understanding of the [place] Date Initiated: 02/12/2016 Goal Status: Active Interventions: Provide education on orientation to the wound center Notes: Pain, Acute or Chronic Nursing Diagnoses: Pain, acute or chronic: actual or potential Potential alteration in comfort, pain Goals: CHIRSTOPHER, SCHWARZ ([id number]) Patient  will verbalize adequate pain control and receive pain control interventions during procedures as needed Date Initiated: 02/12/2016 Goal Status: Active Patient/caregiver will verbalize adequate pain control between visits Date Initiated: 02/12/2016 Goal Status: Active Interventions: Assess comfort goal upon admission Complete pain assessment as per visit requirements Notes: Pressure Nursing Diagnoses: Knowledge deficit related to causes and risk factors for pressure ulcer development Knowledge deficit related to management of pressures ulcers Goals: Patient will remain free from development of additional pressure ulcers Date Initiated: 02/12/2016 Goal Status: Active Interventions: Assess: immobility, friction, shearing, incontinence upon admission and as needed Assess offloading mechanisms upon admission and as needed Assess potential for pressure ulcer upon admission and as needed Provide education on pressure ulcers Notes: Wound/Skin Impairment Nursing Diagnoses: Impaired tissue integrity Goals: Ulcer/skin breakdown will have a volume reduction of 30% by week 4 Date Initiated: 02/12/2016 Goal Status: Active Ulcer/skin breakdown will have a volume reduction of 50% by week 8 Date Initiated: 02/12/2016 Goal Status: Active Ulcer/skin breakdown will have a volume reduction of 80% by week 12 Date Initiated: 02/12/2016 RHYS, DEJONGH ([id number]) Goal Status: Active Interventions: Assess patient/caregiver ability to obtain necessary supplies Assess patient/caregiver ability to perform ulcer/skin care regimen upon admission and as needed Assess ulceration(s) every visit Notes: Electronic Signature(s) Signed: 02/12/2016 5:14:55 PM By: Alric Quan Entered By: Alric Quan on 02/12/2016 15:39:42 Pohl, Jalik Lenna Sciara ([id number]) -------------------------------------------------------------------------------- Pain Assessment Details Patient Name: [name]. Date  of Service: 02/12/2016 2:15 PM Medical Record Number: [id number] Patient Account Number: [id number] Date of Birth/Sex: [birth year] (65 y.o. Male) Treating RN: Ahmed Prima Primary Care Physician: Garret Reddish Other Clinician: Referring Physician: Treating Physician/Extender: Ricard Dillon Weeks in Treatment: 0 Active Problems Location of Pain Severity and Description of Pain Patient Has Paino Yes Site Locations Pain Location: Pain in Ulcers With Dressing Change: Yes Duration of the Pain. Constant / Intermittento Constant Rate the pain. Current Pain Level: 3 Worst Pain Level: 4 Least Pain Level: 1 Character of Pain Describe the Pain: Tender, Throbbing Pain Management and Medication Current Pain Management: Electronic Signature(s) Signed: 02/12/2016 5:14:55 PM By: Alric Quan Entered By: Alric Quan on 02/12/2016 15:05:23 Griffiths, [name] ([id number]) -------------------------------------------------------------------------------- Patient/Caregiver Education Details Patient Name: [name] Date of Service: 02/12/2016 2:15 PM Medical Record Number: [id number] Patient Account Number: [id number] Date of Birth/Gender: 13-Mar-1951 (65 y.o. Male) Treating RN: Ahmed Prima Primary Care Physician: Garret Reddish Other Clinician: Referring Physician: Treating Physician/Extender: Tito Dine in Treatment: 0 Education Assessment Education Provided To: Patient Education Topics Provided Wound/Skin Impairment: Handouts: Other: CHANGE DRESSING AS ORDERED Methods: Demonstration, Explain/Verbal Responses: State content correctly Electronic Signature(s) Signed: 02/12/2016 5:14:55 PM By: Alric Quan Entered By: Alric Quan on 02/12/2016 15:51:06 Macpherson, [name] ([id number]) -------------------------------------------------------------------------------- Wound Assessment Details Patient Name: [name]. Date of Service:  02/12/2016 2:15 PM Medical Record Number: [id number] Patient Account Number: [id number] Date of Birth/Sex: January 12, 1951 (65 y.o. Male) Treating RN: Ahmed Prima Primary Care Physician: Garret Reddish Other Clinician: Referring Physician: Treating Physician/Extender: Ricard Dillon Weeks in Treatment: 0 Wound Status Wound Number: 1 Primary Pressure Ulcer Etiology: Wound Location: Gluteus - Midline Wound Open Wounding  Event: Pressure Injury Status: Date Acquired: 02/04/2016 Comorbid Hypertension, Gout, Received Weeks Of Treatment: 0 History: Chemotherapy, Received Radiation Clustered Wound: No Photos Photo Uploaded By: Alric Quan on 02/12/2016 16:42:33 Wound Measurements Length: (cm) 7 Width: (cm) 9.5 Depth: (cm) 0.1 Area: (cm) 52.229 Volume: (cm) 5.223 % Reduction in Area: 0% % Reduction in Volume: 0% Epithelialization: None Tunneling: No Undermining: No Wound Description Classification: Category/Stage II Wound Margin: Distinct, outline attached Exudate Amount: Medium Exudate Type: Serous Exudate Color: amber Foul Odor After Cleansing: No Wound Bed Granulation Amount: Medium (34-66%) Exposed Structure Granulation Quality: Pink Fascia Exposed: No Necrotic Amount: Medium (34-66%) Fat Layer Exposed: No Necrotic Quality: Adherent Slough Tendon Exposed: No Bordeau, Grayson J. ([id number]) Muscle Exposed: No Joint Exposed: No Bone Exposed: No Limited to Skin Breakdown Periwound Skin Texture Texture Color No Abnormalities Noted: No No Abnormalities Noted: No Moisture Temperature / Pain No Abnormalities Noted: No Temperature: No Abnormality Moist: Yes Tenderness on Palpation: Yes Wound Preparation Ulcer Cleansing: Rinsed/Irrigated with Saline Topical Anesthetic Applied: Other: LIDOCAINE 4%, Treatment Notes Wound #1 (Midline Gluteus) 1. Cleansed with: Clean wound with Normal Saline 2. Anesthetic Topical Lidocaine 4% cream to wound bed prior to  debridement 3. Peri-wound Care: Skin Prep 4. Dressing Applied: Promogran 5. Secondary Dressing Applied Bordered Foam Dressing Dry Gauze Electronic Signature(s) Signed: 02/12/2016 5:14:55 PM By: Alric Quan Entered By: Alric Quan on 02/12/2016 15:32:07 SHAIN, CERASOLI ([id number]) -------------------------------------------------------------------------------- Vitals Details Patient Name: [name] Date of Service: 02/12/2016 2:15 PM Medical Record Number: [id number] Patient Account Number: [id number] Date of Birth/Sex: [birth year] (65 y.o. Male) Treating RN: Carolyne Fiscal, Debi Primary Care Physician: Garret Reddish Other Clinician: Referring Physician: Treating Physician/Extender: Ricard Dillon Weeks in Treatment: 0 Vital Signs Time Taken: 15:05 Temperature (F): 98.1 Height (in): 73 Pulse (bpm): 83 Source: Stated Respiratory Rate (breaths/min): 18 Weight (lbs): 187 Blood Pressure (mmHg): 141/83 Source: Stated Reference Range: 80 - 120 mg / dl Body Mass Index (BMI): 24.7 Electronic Signature(s) Signed: 02/12/2016 5:14:55 PM By: Alric Quan Entered By: Alric Quan on 02/12/2016 15:07:04

## 2016-02-13 NOTE — Progress Notes (Signed)
TARIS, VILLALVA ([id number]) Visit Report for 02/12/2016 Chief Complaint Document Details Patient Name: [name], [name]. Date of Service: 02/12/2016 2:15 PM Medical Record Patient Account Number: [id number] Number: Treating RN: Ahmed Prima [birth year] (65 y.o. Other Clinician: Date of Birth/Sex: Male) Treating Michelle Wnek Primary Care Physician: Garret Reddish Physician/Extender: G Referring Physician: Suella Grove in Treatment: 0 Information Obtained from: Patient Chief Complaint 02/12/16; patient is here for review of her pressure ulcer on his buttock Electronic Signature(s) Signed: 02/13/2016 8:01:59 AM By: Linton Ham MD Entered By: Linton Ham on 02/12/2016 16:43:49 Schnoebelen, [name] ([id number]) -------------------------------------------------------------------------------- Debridement Details Patient Name: [name] Date of Service: 02/12/2016 2:15 PM Medical Record Patient Account Number: [id number] Number: Treating RN: Ahmed Prima 09-Jul-1950 (65 y.o. Other Clinician: Date of Birth/Sex: Male) Treating Nadiya Pieratt Primary Care Physician: Garret Reddish Physician/Extender: G Referring Physician: Suella Grove in Treatment: 0 Debridement Performed for Wound #1 Midline Gluteus Assessment: Performed By: Physician Ricard Dillon, MD Debridement: Debridement Pre-procedure Yes - 15:41 Verification/Time Out Taken: Start Time: 15:42 Pain Control: Lidocaine 4% Topical Solution Level: Skin/Subcutaneous Tissue Total Area Debrided (L x 7 (cm) x 9.5 (cm) = 66.5 (cm) W): Tissue and other Viable, Non-Viable, Exudate, Fibrin/Slough, Subcutaneous material debrided: Instrument: Curette Bleeding: Minimum Hemostasis Achieved: Pressure End Time: 15:44 Procedural Pain: 0 Post Procedural Pain: 0 Response to Treatment: Procedure was tolerated well Post Debridement Measurements of Total Wound Length: (cm) 7 Stage: Category/Stage  II Width: (cm) 9.5 Depth: (cm) 0.1 Volume: (cm) 5.223 Character of Wound/Ulcer Post Stable Debridement: Severity of Tissue Post Fat layer exposed Debridement: Post Procedure Diagnosis Same as Pre-procedure Electronic Signature(s) Signed: 02/12/2016 5:14:55 PM By: Frankey Poot ([id number]) Signed: 02/13/2016 8:01:59 AM By: Linton Ham MD Entered By: Linton Ham on 02/12/2016 16:43:15 Byas, [name] ([id number]) -------------------------------------------------------------------------------- HPI Details Patient Name: [name] Date of Service: 02/12/2016 2:15 PM Medical Record Patient Account Number: [id number] Number: Treating RN: Ahmed Prima 16-May-1950 (65 y.o. Other Clinician: Date of Birth/Sex: Male) Treating Cressie Betzler Primary Care Physician: Garret Reddish Physician/Extender: G Referring Physician: Suella Grove in Treatment: 0 History of Present Illness HPI Description: 02/12/16; this is a patient who was [facility] in late September and underwent esophagectomy for esophageal cancer on September 29. Several days into this stay in the hospital he started to experience pain in his Buttock. Eventually he was able to get the attention of the wound nurse who thought this was a friction injury was given barrier cream. By the time he was discharged from hospital and got home he continued to complain of pain to his wife and a noted significant injury in the area. He was seen by his primary doctor about 4 noted a stage II pressure ulcer did some debridement and prescribed Keflex. He is not doing any specific dressings topically to the wound and he is referred here for review of this. The patient has a gastric sleeve and is already on a liquid diet. He receives feeding through his jejunostomy tube at night to supplement this. From this point of view he appears to be doing well Electronic Signature(s) Signed:  02/13/2016 8:01:59 AM By: Linton Ham MD Entered By: Linton Ham on 02/12/2016 16:51:32 Lewison, [name] ([id number]) -------------------------------------------------------------------------------- Physical Exam Details Patient Name: Davidson, Roberto. Date of Service: 02/12/2016 2:15 PM Medical Record Patient Account Number: [id number] Number: Treating RN: Ahmed Prima [birth year] (65 y.o. Other Clinician: Date of Birth/Sex: Male) Treating Shatima Zalar Primary Care Physician: Garret Reddish Physician/Extender:  G Referring Physician: Weeks in Treatment: 0 Constitutional Sitting or standing Blood Pressure is within target range for patient.. . Pulse regular and within target range for patient.Marland Kitchen Respirations regular, non-labored and within target range.. Temperature is normal and within the target range for the patient.. Patient's appearance is neat and clean. Appears in no acute distress. Well nourished and well developed.. Eyes Conjunctivae clear. No discharge.Marland Kitchen Respiratory Respiratory effort is easy and symmetric bilaterally. Rate is normal at rest and on room air.. Bilateral breath sounds are clear and equal in all lobes with no wheezes, rales or rhonchi.. Cardiovascular Heart rhythm and rate regular, without murmur or gallop. He appears euvolemic. Gastrointestinal (GI) Patient has a jejunostomy tube. No liver or spleen enlargement or tenderness.Marland Kitchen Psychiatric No evidence of depression, anxiety, or agitation. Calm, cooperative, and communicative. Appropriate interactions and affect.. Notes Wound exam; the areas on his bilateral buttocks in an inverted V. On the left buttock this has associated slough and it is a stage II injury a fairly superficial wound area at this point. On the other side there is no open area but likely stage I injury. There is really no evidence of infection I didn't see any reason for antibiotics Electronic Signature(s) Signed:  02/13/2016 8:01:59 AM By: Linton Ham MD Entered By: Linton Ham on 02/12/2016 16:50:41 Ledbetter, [name] ([id number]) -------------------------------------------------------------------------------- Physician Orders Details Patient Name: [name]. Date of Service: 02/12/2016 2:15 PM Medical Record Patient Account Number: [id number] Number: Treating RN: Ahmed Prima 1950/05/09 (65 y.o. Other Clinician: Date of Birth/Sex: Male) Treating Girtha Kilgore Primary Care Physician: Garret Reddish Physician/Extender: G Referring Physician: Suella Grove in Treatment: 0 Verbal / Phone Orders: Yes ClinicianCarolyne Fiscal, Debi Read Back and Verified: Yes Diagnosis Coding Wound Cleansing Wound #1 Midline Gluteus o Clean wound with Normal Saline. Anesthetic Wound #1 Midline Gluteus o Topical Lidocaine 4% cream applied to wound bed prior to debridement - CLINIC USE ONLY Skin Barriers/Peri-Wound Care Wound #1 Midline Gluteus o Skin Prep Primary Wound Dressing Wound #1 Midline Gluteus o Promogran - MOISTEN WITH SALINE Secondary Dressing Wound #1 Midline Gluteus o Dry Gauze o Boardered Foam Dressing Dressing Change Frequency Wound #1 Midline Gluteus o Change dressing every other day. Follow-up Appointments Wound #1 Midline Gluteus o Return Appointment in 1 week. Off-Loading Wound #1 Midline Gluteus o Turn and reposition every 2 hours ANZIO, OREJEL ([id number]) Electronic Signature(s) Signed: 02/12/2016 5:14:55 PM By: Alric Quan Signed: 02/13/2016 8:01:59 AM By: Linton Ham MD Entered By: Alric Quan on 02/12/2016 15:49:53 Rosales, [name] ([id number]) -------------------------------------------------------------------------------- Problem List Details Patient Name: JOOD, MONJARAZ. Date of Service: 02/12/2016 2:15 PM Medical Record Patient Account Number: [id number] Number: Treating RN: Ahmed Prima [birth year] (65 y.o. Other Clinician: Date of Birth/Sex: Male) Treating Freyja Govea Primary Care Physician: Garret Reddish Physician/Extender: G Referring Physician: Suella Grove in Treatment: 0 Active Problems ICD-10 Encounter Code Description Active Date Diagnosis L89.322 Pressure ulcer of left buttock, stage 2 02/12/2016 Yes L89.311 Pressure ulcer of right buttock, stage 1 02/12/2016 Yes Inactive Problems Resolved Problems Electronic Signature(s) Signed: 02/13/2016 8:01:59 AM By: Linton Ham MD Entered By: Linton Ham on 02/12/2016 16:42:46 Putz, [name] ([id number]) -------------------------------------------------------------------------------- Progress Note Details Patient Name: [name]. Date of Service: 02/12/2016 2:15 PM Medical Record Patient Account Number: [id number] Number: Treating RN: Ahmed Prima 1951-01-11 (65 y.o. Other Clinician: Date of Birth/Sex: Male) Treating Ziyana Morikawa Primary Care Physician: Garret Reddish Physician/Extender: G Referring Physician: Suella Grove in Treatment: 0 Subjective Chief Complaint Information obtained from Patient  02/12/16; patient is here for review of her pressure ulcer on his buttock History of Present Illness (HPI) 02/12/16; this is a patient who was [facility] in late September and underwent esophagectomy for esophageal cancer on September 29. Several days into this stay in the hospital he started to experience pain in his Buttock. Eventually he was able to get the attention of the wound nurse who thought this was a friction injury was given barrier cream. By the time he was discharged from hospital and got home he continued to complain of pain to his wife and a noted significant injury in the area. He was seen by his primary doctor about 4 noted a stage II pressure ulcer did some debridement and prescribed Keflex. He is not doing any specific dressings topically to the  wound and he is referred here for review of this. The patient has a gastric sleeve and is already on a liquid diet. He receives feeding through his jejunostomy tube at night to supplement this. From this point of view he appears to be doing well Wound History Patient presents with 1 open wound that has been present for approximately 02/04/16. Patient has been treating wound in the following manner: BARRIER CREAM. Laboratory tests have not been performed in the last month. Patient reportedly has not tested positive for an antibiotic resistant organism. Patient reportedly has not tested positive for osteomyelitis. Patient reportedly has had testing performed to evaluate circulation in the legs. Patient experiences the following problems associated with their wounds: infection, swelling. Patient History Information obtained from Patient. Allergies fentanyl, levofloxacin, latex, animal dander Family History Cancer - Maternal Grandparents, Paternal Grandparents, Diabetes - Father, Paternal Grandparents, Siblings, Heart Disease - Maternal Grandparents, Hypertension - Father, Stroke - Father, , Mother, Thyroid VILLA, HOLLRAH ([id number]) Problems - Maternal Grandparents, No family history of Hereditary Spherocytosis, Kidney Disease, Lung Disease, Seizures, Tuberculosis. Social History Never smoker, Marital Status - Married, Alcohol Use - Rarely, Drug Use - No History, Caffeine Use - Never. Medical History Cardiovascular Patient has history of Hypertension Musculoskeletal Patient has history of Gout Oncologic Patient has history of Received Chemotherapy - 2017, Received Radiation - 2017 Review of Systems (ROS) Constitutional Symptoms ([place]) The patient has no complaints or symptoms. Eyes Complains or has symptoms of Glasses / Contacts. Ear/Nose/Mouth/Throat ESOPHAGEAL CANCER Hematologic/Lymphatic The patient has no complaints or symptoms, VARICOSE VEINS Respiratory The  patient has no complaints or symptoms. Cardiovascular HYPERLIPIDEMIA Gastrointestinal BLOOD IN STOOL COLON POLYPS J-TUBE Genitourinary PROSTATE CANCER LEFT GROIN PAIN Immunological The patient has no complaints or symptoms. Integumentary (Skin) Complains or has symptoms of Wounds. Musculoskeletal OSTEOPENIA RIGHT FEMUR Neurologic The patient has no complaints or symptoms. Oncologic ESOPHAGEAL CANCER PROSTATE CANCER Psychiatric The patient has no complaints or symptoms. Objective TORETTO, TOPPING. ([id number]) Constitutional Sitting or standing Blood Pressure is within target range for patient.. Pulse regular and within target range for patient.Marland Kitchen Respirations regular, non-labored and within target range.. Temperature is normal and within the target range for the patient.. Patient's appearance is neat and clean. Appears in no acute distress. Well nourished and well developed.. Vitals Time Taken: 3:05 PM, Height: 73 in, Source: Stated, Weight: 187 lbs, Source: Stated, BMI: 24.7, Temperature: 98.1 F, Pulse: 83 bpm, Respiratory Rate: 18 breaths/min, Blood Pressure: 141/83 mmHg. Eyes Conjunctivae clear. No discharge.Marland Kitchen Respiratory Respiratory effort is easy and symmetric bilaterally. Rate is normal at rest and on room air.. Bilateral breath sounds are clear and equal in all lobes with no wheezes, rales  or rhonchi.. Cardiovascular Heart rhythm and rate regular, without murmur or gallop. He appears euvolemic. Gastrointestinal (GI) Patient has a jejunostomy tube. No liver or spleen enlargement or tenderness.Marland Kitchen Psychiatric No evidence of depression, anxiety, or agitation. Calm, cooperative, and communicative. Appropriate interactions and affect.. General Notes: Wound exam; the areas on his bilateral buttocks in an inverted V. On the left buttock this has associated slough and it is a stage II injury a fairly superficial wound area at this point. On the other side there is no open area  but likely stage I injury. There is really no evidence of infection I didn't see any reason for antibiotics Integumentary (Hair, Skin) Wound #1 status is Open. Original cause of wound was Pressure Injury. The wound is located on the Midline Gluteus. The wound measures 7cm length x 9.5cm width x 0.1cm depth; 52.229cm^2 area and 5.223cm^3 volume. The wound is limited to skin breakdown. There is no tunneling or undermining noted. There is a medium amount of serous drainage noted. The wound margin is distinct with the outline attached to the wound base. There is medium (34-66%) pink granulation within the wound bed. There is a medium (34-66%) amount of necrotic tissue within the wound bed including Adherent Slough. The periwound skin appearance exhibited: Moist. Periwound temperature was noted as No Abnormality. The periwound has tenderness on palpation. Assessment BIRT, BIEDRZYCKI ([id number]) Active Problems ICD-10 [phone] - Pressure ulcer of left buttock, stage 2 L89.311 - Pressure ulcer of right buttock, stage 1 Procedures Wound #1 Wound #1 is a Pressure Ulcer located on the Midline Gluteus . There was a Skin/Subcutaneous Tissue Debridement [id number]) debridement with total area of 66.5 sq cm performed by Ricard Dillon, MD. with the following instrument(s): Curette to remove Viable and Non-Viable tissue/material including Exudate, Fibrin/Slough, and Subcutaneous after achieving pain control using Lidocaine 4% Topical Solution. A time out was conducted at 15:41, prior to the start of the procedure. A Minimum amount of bleeding was controlled with Pressure. The procedure was tolerated well with a pain level of 0 throughout and a pain level of 0 following the procedure. Post Debridement Measurements: 7cm length x 9.5cm width x 0.1cm depth; 5.223cm^3 volume. Post debridement Stage noted as Category/Stage II. Character of Wound/Ulcer Post Debridement is stable. Severity of Tissue Post  Debridement is: Fat layer exposed. Post procedure Diagnosis Wound #1: Same as Pre-Procedure Plan Wound Cleansing: Wound #1 Midline Gluteus: Clean wound with Normal Saline. Anesthetic: Wound #1 Midline Gluteus: Topical Lidocaine 4% cream applied to wound bed prior to debridement - CLINIC USE ONLY Skin Barriers/Peri-Wound Care: Wound #1 Midline Gluteus: Skin Prep Primary Wound Dressing: Wound #1 Midline Gluteus: Promogran - MOISTEN WITH SALINE Secondary Dressing: Wound #1 Midline Gluteus: Dry Gauze Boardered Foam Dressing Dressing Change FrequencyDAQUEZ, MOWATT. ([id number]) Wound #1 Midline Gluteus: Change dressing every other day. Follow-up Appointments: Wound #1 Midline Gluteus: Return Appointment in 1 week. Off-Loading: Wound #1 Midline Gluteus: Turn and reposition every 2 hours #1 we are going to use Promogran covered with the gauze and a border foam dressing. #2 spent a fair amount of time talking about pressure-relief. He'll need to take his weight when sitting on his upper thighs and initial tuberosity laterally. Things are more complicated when he is in bed since he needs to maintain a 45 angle for his jejunostomy feedings nevertheless the key here is pressure-relief. I don't think he needs a specialty mattress at this point I'm hopeful with the instructions I have given and the dressings  that this will close over if it doesn't we'll need to consider additional pressure-relief such as specialty mattress covers etc. Electronic Signature(s) Signed: 02/13/2016 8:01:59 AM By: Linton Ham MD Entered By: Linton Ham on 02/12/2016 16:53:22 Dooling, [name] ([id number]) -------------------------------------------------------------------------------- ROS/PFSH Details Patient Name: [name]. Date of Service: 02/12/2016 2:15 PM Medical Record Patient Account Number: [id number] Number: Treating RN: Ahmed Prima 28-Feb-1951 (65 y.o. Other  Clinician: Date of Birth/Sex: Male) Treating Shailee Foots, [place] Primary Care Physician: Garret Reddish Physician/Extender: G Referring Physician: Suella Grove in Treatment: 0 Information Obtained From Patient Wound History Do you currently have one or more open woundso Yes How many open wounds do you currently haveo 1 Approximately how long have you had your woundso 02/04/16 How have you been treating your wound(s) until nowo BARRIER CREAM Has your wound(s) ever healed and then re-openedo No Have you had any lab work done in the past montho No Have you tested positive for an antibiotic resistant organism (MRSA, VRE)o No Have you tested positive for osteomyelitis (bone infection)o No Have you had any tests for circulation on your legso Yes Who ordered the testo [facility] Where was the test doneo [facility] Have you had other problems associated with your woundso Infection, Swelling Eyes Complaints and Symptoms: Positive for: Glasses / Contacts Integumentary (Skin) Complaints and Symptoms: Positive for: Wounds Constitutional Symptoms (General Health) Complaints and Symptoms: No Complaints or Symptoms Ear/Nose/Mouth/Throat Complaints and Symptoms: Review of System Notes: ESOPHAGEAL CANCER Hematologic/Lymphatic MARBIN, CRAWN ([id number]) Complaints and Symptoms: No Complaints or Symptoms Complaints and Symptoms: Review of System Notes: VARICOSE VEINS Respiratory Complaints and Symptoms: No Complaints or Symptoms Cardiovascular Complaints and Symptoms: Review of System Notes: HYPERLIPIDEMIA Medical History: Positive for: Hypertension Gastrointestinal Complaints and Symptoms: Review of System Notes: BLOOD IN STOOL COLON POLYPS J-TUBE Genitourinary Complaints and Symptoms: Review of System Notes: PROSTATE CANCER LEFT GROIN PAIN Immunological Complaints and Symptoms: No Complaints or Symptoms Musculoskeletal Complaints and Symptoms: Review of System  Notes: OSTEOPENIA RIGHT FEMUR Medical History: Positive for: Gout Neurologic Lia, Wright J. ([id number]) Complaints and Symptoms: No Complaints or Symptoms Oncologic Complaints and Symptoms: Review of System Notes: ESOPHAGEAL CANCER PROSTATE CANCER Medical History: Positive for: Received Chemotherapy - 2017; Received Radiation - 2017 Psychiatric Complaints and Symptoms: No Complaints or Symptoms Immunizations Pneumococcal Vaccine: Received Pneumococcal Vaccination: Yes Family and Social History Cancer: Yes - Maternal Grandparents, Paternal Grandparents; Diabetes: Yes - Father, Paternal Grandparents, Siblings; Heart Disease: Yes - Maternal Grandparents; Hereditary Spherocytosis: No; Hypertension: Yes - Father; Kidney Disease: No; Lung Disease: No; Seizures: No; Stroke: Yes - Father, , Mother; Thyroid Problems: Yes - Maternal Grandparents; Tuberculosis: No; Never smoker; Marital Status - Married; Alcohol Use: Rarely; Drug Use: No History; Caffeine Use: Never; Financial Concerns: No; Food, Clothing or Shelter Needs: No; Support System Lacking: No; Transportation Concerns: No; Advanced Directives: No; Patient does not want information on Advanced Directives; Do not resuscitate: No; Living Will: Yes (Not Provided); Medical Power of Attorney: Yes - Lamarr Lulas (Not Provided) Electronic Signature(s) Signed: 02/12/2016 5:14:55 PM By: Alric Quan Signed: 02/13/2016 8:01:59 AM By: Linton Ham MD Entered By: Alric Quan on 02/12/2016 15:22:46 [name] ([id number]) -------------------------------------------------------------------------------- [place] Details Patient Name: [name] Date of Service: 02/12/2016 Medical Record Patient Account Number: [id number] Number: Treating RN: Ahmed Prima 1950-12-29 (65 y.o. Other Clinician: Date of Birth/Sex: Male) Treating Daivik Overley Primary Care Physician: Garret Reddish Physician/Extender:  G Referring Physician: Suella Grove in Treatment: 0 Diagnosis Coding ICD-10 Codes Code Description [phone] Pressure ulcer  of left buttock, stage 2 L89.311 Pressure ulcer of right buttock, stage 1 Facility Procedures CPT4 Code: [id number] Description: [id number] - WOUND CARE VISIT-LEV 3 EST PT Modifier: Quantity: 1 CPT4 Code: [id number] Description: [id number] - DEB SUBQ TISSUE 20 SQ CM/< ICD-10 Description Diagnosis L89.322 Pressure ulcer of left buttock, stage 2 Modifier: Quantity: 1 Physician Procedures CPT4 Code: [id number] Description: [id number] - WC PHYS SUBQ TISS 20 SQ CM ICD-10 Description Diagnosis L89.322 Pressure ulcer of left buttock, stage 2 Modifier: Quantity: 1 Electronic Signature(s) Signed: 02/13/2016 8:01:59 AM By: Linton Ham MD Entered By: Linton Ham on 02/12/2016 16:55:28

## 2016-02-19 DIAGNOSIS — Z48815 Encounter for surgical aftercare following surgery on the digestive system: Secondary | ICD-10-CM | POA: Diagnosis not present

## 2016-02-19 DIAGNOSIS — Z7982 Long term (current) use of aspirin: Secondary | ICD-10-CM | POA: Diagnosis not present

## 2016-02-19 DIAGNOSIS — Z903 Acquired absence of stomach [part of]: Secondary | ICD-10-CM | POA: Diagnosis not present

## 2016-02-19 DIAGNOSIS — Z8501 Personal history of malignant neoplasm of esophagus: Secondary | ICD-10-CM | POA: Diagnosis not present

## 2016-02-19 DIAGNOSIS — C159 Malignant neoplasm of esophagus, unspecified: Secondary | ICD-10-CM | POA: Diagnosis not present

## 2016-02-19 DIAGNOSIS — Z923 Personal history of irradiation: Secondary | ICD-10-CM | POA: Diagnosis not present

## 2016-02-19 DIAGNOSIS — Z9221 Personal history of antineoplastic chemotherapy: Secondary | ICD-10-CM | POA: Diagnosis not present

## 2016-02-19 DIAGNOSIS — Z9049 Acquired absence of other specified parts of digestive tract: Secondary | ICD-10-CM | POA: Diagnosis not present

## 2016-02-19 DIAGNOSIS — J90 Pleural effusion, not elsewhere classified: Secondary | ICD-10-CM | POA: Diagnosis not present

## 2016-02-20 ENCOUNTER — Encounter: Payer: Medicare Other | Admitting: Internal Medicine

## 2016-02-20 DIAGNOSIS — E785 Hyperlipidemia, unspecified: Secondary | ICD-10-CM | POA: Diagnosis not present

## 2016-02-20 DIAGNOSIS — Z8501 Personal history of malignant neoplasm of esophagus: Secondary | ICD-10-CM | POA: Diagnosis not present

## 2016-02-20 DIAGNOSIS — L89302 Pressure ulcer of unspecified buttock, stage 2: Secondary | ICD-10-CM | POA: Diagnosis not present

## 2016-02-20 DIAGNOSIS — L89322 Pressure ulcer of left buttock, stage 2: Secondary | ICD-10-CM | POA: Diagnosis not present

## 2016-02-20 DIAGNOSIS — I10 Essential (primary) hypertension: Secondary | ICD-10-CM | POA: Diagnosis not present

## 2016-02-20 DIAGNOSIS — L89311 Pressure ulcer of right buttock, stage 1: Secondary | ICD-10-CM | POA: Diagnosis not present

## 2016-02-20 DIAGNOSIS — M109 Gout, unspecified: Secondary | ICD-10-CM | POA: Diagnosis not present

## 2016-02-21 NOTE — Progress Notes (Signed)
[name] ([id number]) Visit Report for 02/20/2016 Arrival Information Details Patient Name: [name], [name]. Date of Service: 02/20/2016 8:45 AM Medical Record Number: [id number] Patient Account Number: [id number] Date of Birth/Sex: [birth year] (65 y.o. Male) Treating RN: Ahmed Prima Primary Care Physician: Garret Reddish Other Clinician: Referring Physician: Garret Reddish Treating Physician/Extender: Tito Dine in Treatment: 1 Visit Information History Since Last Visit All ordered tests and consults were completed: No Patient Arrived: Ambulatory Added or deleted any medications: No Arrival Time: 08:49 Any new allergies or adverse reactions: No Accompanied By: wife Had a fall or experienced change in No Transfer Assistance: None activities of daily living that may affect Patient Identification Verified: Yes risk of falls: Secondary Verification Process Yes Signs or symptoms of abuse/neglect since last No Completed: visito Patient Requires Transmission-Based No Hospitalized since last visit: No Precautions: Pain Present Now: No Patient Has Alerts: No Electronic Signature(s) Signed: 02/20/2016 5:37:51 PM By: Alric Quan Entered By: Alric Quan on 02/20/2016 08:49:34 [name], [name] ([id number]) -------------------------------------------------------------------------------- Clinic Level of Care Assessment Details Patient Name: [name] Date of Service: 02/20/2016 8:45 AM Medical Record Number: [id number] Patient Account Number: [id number] Date of Birth/Sex: 1951/02/11 (65 y.o. Male) Treating RN: Carolyne Fiscal, Debi Primary Care Physician: Garret Reddish Other Clinician: Referring Physician: Garret Reddish Treating Physician/Extender: Tito Dine in Treatment: 1 Clinic Level of Care Assessment Items TOOL 4 Quantity Score X - Use when only an EandM is performed on FOLLOW-UP visit 1 0 ASSESSMENTS - Nursing  Assessment / Reassessment X - Reassessment of Co-morbidities (includes updates in patient status) 1 10 X - Reassessment of Adherence to Treatment Plan 1 5 ASSESSMENTS - Wound and Skin Assessment / Reassessment X - Simple Wound Assessment / Reassessment - one wound 1 5 []  - Complex Wound Assessment / Reassessment - multiple wounds 0 []  - Dermatologic / Skin Assessment (not related to wound area) 0 ASSESSMENTS - Focused Assessment []  - Circumferential Edema Measurements - multi extremities 0 []  - Nutritional Assessment / Counseling / Intervention 0 []  - Lower Extremity Assessment (monofilament, tuning fork, pulses) 0 []  - Peripheral Arterial Disease Assessment (using hand held doppler) 0 ASSESSMENTS - Ostomy and/or Continence Assessment and Care []  - Incontinence Assessment and Management 0 []  - Ostomy Care Assessment and Management (repouching, etc.) 0 PROCESS - Coordination of Care X - Simple Patient / Family Education for ongoing care 1 15 []  - Complex (extensive) Patient / Family Education for ongoing care 0 X - Staff obtains Programmer, systems, Records, Test Results / Process Orders 1 10 []  - Staff telephones HHA, Nursing Homes / Clarify orders / etc 0 []  - Routine Transfer to another Facility (non-emergent condition) 0 [name], [name] ([id number]) []  - Routine Hospital Admission (non-emergent condition) 0 []  - New Admissions / Biomedical engineer / Ordering NPWT, Apligraf, etc. 0 []  - Emergency Hospital Admission (emergent condition) 0 X - Simple Discharge Coordination 1 10 []  - Complex (extensive) Discharge Coordination 0 PROCESS - Special Needs []  - Pediatric / Minor Patient Management 0 []  - Isolation Patient Management 0 []  - Hearing / Language / Visual special needs 0 []  - Assessment of Community assistance (transportation, D/C planning, etc.) 0 []  - Additional assistance / Altered mentation 0 []  - Support Surface(s) Assessment (bed, cushion, seat, etc.) 0 INTERVENTIONS - Wound  Cleansing / Measurement X - Simple Wound Cleansing - one wound 1 5 []  - Complex Wound Cleansing - multiple wounds 0 X - Wound Imaging (photographs - any number of wounds)  1 5 []  - Wound Tracing (instead of photographs) 0 X - Simple Wound Measurement - one wound 1 5 []  - Complex Wound Measurement - multiple wounds 0 INTERVENTIONS - Wound Dressings X - Small Wound Dressing one or multiple wounds 1 10 []  - Medium Wound Dressing one or multiple wounds 0 []  - Large Wound Dressing one or multiple wounds 0 X - Application of Medications - topical 1 5 []  - Application of Medications - injection 0 INTERVENTIONS - Miscellaneous []  - External ear exam 0 [name], [name] ([id number]) []  - Specimen Collection (cultures, biopsies, blood, body fluids, etc.) 0 []  - Specimen(s) / Culture(s) sent or taken to Lab for analysis 0 []  - Patient Transfer (multiple staff / Harrel Lemon Lift / Similar devices) 0 []  - Simple Staple / Suture removal (25 or less) 0 []  - Complex Staple / Suture removal (26 or more) 0 []  - Hypo / Hyperglycemic Management (close monitor of Blood Glucose) 0 []  - Ankle / Brachial Index (ABI) - do not check if billed separately 0 X - Vital Signs 1 5 Has the patient been seen at the hospital within the last three years: Yes Total Score: 90 Level Of Care: New/Established - Level 3 Electronic Signature(s) Signed: 02/20/2016 5:37:51 PM By: Alric Quan Entered By: Alric Quan on 02/20/2016 13:21:01 [name], [name] ([id number]) -------------------------------------------------------------------------------- Encounter Discharge Information Details Patient Name: [name] Date of Service: 02/20/2016 8:45 AM Medical Record Number: [id number] Patient Account Number: [id number] Date of Birth/Sex: [birth year] (65 y.o. Male) Treating RN: Ahmed Prima Primary Care Physician: Garret Reddish Other Clinician: Referring Physician: Garret Reddish Treating Physician/Extender:  Tito Dine in Treatment: 1 Encounter Discharge Information Items Discharge Pain Level: 0 Discharge Condition: Stable Ambulatory Status: Ambulatory Discharge Destination: Home Transportation: Private Auto Accompanied By: wife Schedule Follow-up Appointment: Yes Medication Reconciliation completed and provided to Patient/Care Yes Maxmillian Carsey: Provided on Clinical Summary of Care: 02/20/2016 Form Type Recipient Paper Patient DM Electronic Signature(s) Signed: 02/20/2016 9:08:59 AM By: Ruthine Dose Entered By: Ruthine Dose on 02/20/2016 09:08:59 Camps, [name] ([id number]) -------------------------------------------------------------------------------- Lower Extremity Assessment Details Patient Name: [name]. Date of Service: 02/20/2016 8:45 AM Medical Record Number: [id number] Patient Account Number: [id number] Date of Birth/Sex: 1950/06/01 (65 y.o. Male) Treating RN: Ahmed Prima Primary Care Physician: Garret Reddish Other Clinician: Referring Physician: Garret Reddish Treating Physician/Extender: Ricard Dillon Weeks in Treatment: 1 Electronic Signature(s) Signed: 02/20/2016 5:37:51 PM By: Alric Quan Entered By: Alric Quan on 02/20/2016 08:50:25 Lantier, [name] ([id number]) -------------------------------------------------------------------------------- Multi Wound Chart Details Patient Name: [name]. Date of Service: 02/20/2016 8:45 AM Medical Record Number: [id number] Patient Account Number: [id number] Date of Birth/Sex: August 12, 1950 (65 y.o. Male) Treating RN: Ahmed Prima Primary Care Physician: Garret Reddish Other Clinician: Referring Physician: Garret Reddish Treating Physician/Extender: Ricard Dillon Weeks in Treatment: 1 Vital Signs Height(in): 73 Pulse(bpm): 86 Weight(lbs): 187 Blood Pressure 132/83 (mmHg): Body Mass Index(BMI): 25 Temperature(F): 97.7 Respiratory  Rate 18 (breaths/min): Photos: [1:No Photos] [N/A:N/A] Wound Location: [1:Gluteus - Midline] [N/A:N/A] Wounding Event: [1:Pressure Injury] [N/A:N/A] Primary Etiology: [1:Pressure Ulcer] [N/A:N/A] Comorbid History: [1:Hypertension, Gout, Received Chemotherapy, Received Radiation] [N/A:N/A] Date Acquired: [1:02/04/2016] [N/A:N/A] Weeks of Treatment: [1:1] [N/A:N/A] Wound Status: [1:Open] [N/A:N/A] Measurements L x W x D 1.9x0.4x0.1 [N/A:N/A] (cm) Area (cm) : [1:0.597] [N/A:N/A] Volume (cm) : [1:0.06] [N/A:N/A] % Reduction in Area: [1:98.90%] [N/A:N/A] % Reduction in Volume: 98.90% [N/A:N/A] Classification: [1:Category/Stage II] [N/A:N/A] Exudate Amount: [1:Medium] [N/A:N/A] Exudate Type: [1:Serous] [N/A:N/A] Exudate Color: [1:amber] [N/A:N/A] Wound Margin: [1:Distinct,  outline attached] [N/A:N/A] Granulation Amount: [1:Medium (34-66%)] [N/A:N/A] Granulation Quality: [1:Pink] [N/A:N/A] Necrotic Amount: [1:Medium (34-66%)] [N/A:N/A] Exposed Structures: [1:Fascia: No Fat: No Tendon: No Muscle: No Joint: No Bone: No] [N/A:N/A] Limited to Skin Breakdown Epithelialization: None N/A N/A Periwound Skin Texture: No Abnormalities Noted N/A N/A Periwound Skin Moist: Yes N/A N/A Moisture: Periwound Skin Color: No Abnormalities Noted N/A N/A Temperature: No Abnormality N/A N/A Tenderness on Yes N/A N/A Palpation: Wound Preparation: Ulcer Cleansing: N/A N/A Rinsed/Irrigated with Saline Topical Anesthetic Applied: Other: LIDOCAINE 4% Treatment Notes Electronic Signature(s) Signed: 02/20/2016 5:37:51 PM By: Alric Quan Entered By: Alric Quan on 02/20/2016 08:58:25 JANZEN, KLEBE ([id number]) -------------------------------------------------------------------------------- Tice Details Patient Name: [name] Date of Service: 02/20/2016 8:45 AM Medical Record Number: [id number] Patient Account Number: [id number] Date of Birth/Sex:  24-Jun-1950 (65 y.o. Male) Treating RN: Ahmed Prima Primary Care Physician: Garret Reddish Other Clinician: Referring Physician: Garret Reddish Treating Physician/Extender: Tito Dine in Treatment: 1 Active Inactive Nutrition Nursing Diagnoses: Imbalanced nutrition Goals: Patient/caregiver agrees to and verbalizes understanding of need to obtain nutritional consultation Date Initiated: 02/12/2016 Goal Status: Active Interventions: Assess patient nutrition upon admission and as needed per policy Notes: Orientation to the Wound Care Program Nursing Diagnoses: Knowledge deficit related to the wound healing center program Goals: Patient/caregiver will verbalize understanding of the [facility] Date Initiated: 02/12/2016 Goal Status: Active Interventions: Provide education on orientation to the wound center Notes: Pain, Acute or Chronic Nursing Diagnoses: Pain, acute or chronic: actual or potential Potential alteration in comfort, pain Goals: [name], [name] ([id number]) Patient will verbalize adequate pain control and receive pain control interventions during procedures as needed Date Initiated: 02/12/2016 Goal Status: Active Patient/caregiver will verbalize adequate pain control between visits Date Initiated: 02/12/2016 Goal Status: Active Interventions: Assess comfort goal upon admission Complete pain assessment as per visit requirements Notes: Pressure Nursing Diagnoses: Knowledge deficit related to causes and risk factors for pressure ulcer development Knowledge deficit related to management of pressures ulcers Goals: Patient will remain free from development of additional pressure ulcers Date Initiated: 02/12/2016 Goal Status: Active Interventions: Assess: immobility, friction, shearing, incontinence upon admission and as needed Assess offloading mechanisms upon admission and as needed Assess potential for pressure ulcer upon  admission and as needed Provide education on pressure ulcers Notes: Wound/Skin Impairment Nursing Diagnoses: Impaired tissue integrity Goals: Ulcer/skin breakdown will have a volume reduction of 30% by week 4 Date Initiated: 02/12/2016 Goal Status: Active Ulcer/skin breakdown will have a volume reduction of 50% by week 8 Date Initiated: 02/12/2016 Goal Status: Active Ulcer/skin breakdown will have a volume reduction of 80% by week 12 Date Initiated: 02/12/2016 [name], [name] ([id number]) Goal Status: Active Interventions: Assess patient/caregiver ability to obtain necessary supplies Assess patient/caregiver ability to perform ulcer/skin care regimen upon admission and as needed Assess ulceration(s) every visit Notes: Electronic Signature(s) Signed: 02/20/2016 5:37:51 PM By: Alric Quan Entered By: Alric Quan on 02/20/2016 08:57:03 [name], [name] ([id number]) -------------------------------------------------------------------------------- Pain Assessment Details Patient Name: [name]. Date of Service: 02/20/2016 8:45 AM Medical Record Number: [id number] Patient Account Number: [id number] Date of Birth/Sex: 05-05-1950 (65 y.o. Male) Treating RN: Ahmed Prima Primary Care Physician: Garret Reddish Other Clinician: Referring Physician: Garret Reddish Treating Physician/Extender: Ricard Dillon Weeks in Treatment: 1 Active Problems Location of Pain Severity and Description of Pain Patient Has Paino No Site Locations With Dressing Change: No Pain Management and Medication Current Pain Management: Electronic Signature(s) Signed: 02/20/2016 5:37:51 PM By: Alric Quan Entered  By: Alric Quan on 02/20/2016 08:49:40 [name], [name] ([id number]) -------------------------------------------------------------------------------- Patient/Caregiver Education Details Patient Name: [name] Date of Service: 02/20/2016 8:45 AM Medical  Record Number: [id number] Patient Account Number: [id number] Date of Birth/Gender: 01/27/1951 (65 y.o. Male) Treating RN: Ahmed Prima Primary Care Physician: Garret Reddish Other Clinician: Referring Physician: Garret Reddish Treating Physician/Extender: Tito Dine in Treatment: 1 Education Assessment Education Provided To: Patient Education Topics Provided Wound/Skin Impairment: Handouts: Other: change dressing as ordered Methods: Demonstration, Explain/Verbal Responses: State content correctly Electronic Signature(s) Signed: 02/20/2016 5:37:51 PM By: Alric Quan Entered By: Alric Quan on 02/20/2016 08:59:03 [name], [name] ([id number]) -------------------------------------------------------------------------------- Wound Assessment Details Patient Name: [name]. Date of Service: 02/20/2016 8:45 AM Medical Record Number: [id number] Patient Account Number: [id number] Date of Birth/Sex: [birth year] (65 y.o. Male) Treating RN: Carolyne Fiscal, Debi Primary Care Physician: Garret Reddish Other Clinician: Referring Physician: Garret Reddish Treating Physician/Extender: Ricard Dillon Weeks in Treatment: 1 Wound Status Wound Number: 1 Primary Pressure Ulcer Etiology: Wound Location: Gluteus - Midline Wound Open Wounding Event: Pressure Injury Status: Date Acquired: 02/04/2016 Comorbid Hypertension, Gout, Received Weeks Of Treatment: 1 History: Chemotherapy, Received Radiation Clustered Wound: No Photos Photo Uploaded By: Alric Quan on 02/20/2016 09:10:11 Wound Measurements Length: (cm) 1.9 Width: (cm) 0.4 Depth: (cm) 0.1 Area: (cm) 0.597 Volume: (cm) 0.06 % Reduction in Area: 98.9% % Reduction in Volume: 98.9% Epithelialization: None Tunneling: No Undermining: No Wound Description Classification: Category/Stage II Wound Margin: Distinct, outline attached Exudate Amount: Medium Exudate Type: Serous Exudate Color:  amber Foul Odor After Cleansing: No Wound Bed Granulation Amount: Medium (34-66%) Exposed Structure Granulation Quality: Pink Fascia Exposed: No Necrotic Amount: Medium (34-66%) Fat Layer Exposed: No Necrotic Quality: Adherent Slough Tendon Exposed: No [name], [name] ([id number]) Muscle Exposed: No Joint Exposed: No Bone Exposed: No Limited to Skin Breakdown Periwound Skin Texture Texture Color No Abnormalities Noted: No No Abnormalities Noted: No Moisture Temperature / Pain No Abnormalities Noted: No Temperature: No Abnormality Moist: Yes Tenderness on Palpation: Yes Wound Preparation Ulcer Cleansing: Rinsed/Irrigated with Saline Topical Anesthetic Applied: Other: LIDOCAINE 4%, Treatment Notes Wound #1 (Midline Gluteus) 1. Cleansed with: Clean wound with Normal Saline 2. Anesthetic Topical Lidocaine 4% cream to wound bed prior to debridement 3. Peri-wound Care: Skin Prep 4. Dressing Applied: Promogran 5. Secondary Dressing Applied Bordered Foam Dressing Dry Gauze Electronic Signature(s) Signed: 02/20/2016 5:37:51 PM By: Alric Quan Entered By: Alric Quan on 02/20/2016 08:56:11 [name], [name] ([id number]) -------------------------------------------------------------------------------- Vitals Details Patient Name: [name] Date of Service: 02/20/2016 8:45 AM Medical Record Number: [id number] Patient Account Number: [id number] Date of Birth/Sex: 1950/09/07 (65 y.o. Male) Treating RN: Carolyne Fiscal, Debi Primary Care Physician: Garret Reddish Other Clinician: Referring Physician: Garret Reddish Treating Physician/Extender: Ricard Dillon Weeks in Treatment: 1 Vital Signs Time Taken: 08:49 Temperature (F): 97.7 Height (in): 73 Pulse (bpm): 86 Weight (lbs): 187 Respiratory Rate (breaths/min): 18 Body Mass Index (BMI): 24.7 Blood Pressure (mmHg): 132/83 Reference Range: 80 - 120 mg / dl Electronic Signature(s) Signed: 02/20/2016  5:37:51 PM By: Alric Quan Entered By: Alric Quan on 02/20/2016 08:50:07

## 2016-02-21 NOTE — Progress Notes (Signed)
MICA, KOWALICK ([id number]) Visit Report for 02/20/2016 Chief Complaint Document Details Patient Name: [name], [name]. Date of Service: 02/20/2016 8:45 AM Medical Record Patient Account Number: [id number] Number: Treating RN: Ahmed Prima 1951-01-25 (65 y.o. Other Clinician: Date of Birth/Sex: Male) Treating [name], [name] Primary Care Physician: Garret Reddish Physician/Extender: G Referring Physician: Kaylyn Layer in Treatment: 1 Information Obtained from: Patient Chief Complaint 02/12/16; patient is here for review of her pressure ulcer on his buttock Electronic Signature(s) Signed: 02/20/2016 6:13:30 PM By: Linton Ham MD Entered By: Linton Ham on 02/20/2016 12:53:14 Gilman, [name] ([id number]) -------------------------------------------------------------------------------- HPI Details Patient Name: [name] Date of Service: 02/20/2016 8:45 AM Medical Record Patient Account Number: [id number] Number: Treating RN: Ahmed Prima November 29, 1950 (65 y.o. Other Clinician: Date of Birth/Sex: Male) Treating [name], [name] Primary Care Physician: Garret Reddish Physician/Extender: G Referring Physician: Kaylyn Layer in Treatment: 1 History of Present Illness HPI Description: 02/12/16; this is a patient who was [facility] and underwent esophagectomy for esophageal cancer on September 29. Several days into this stay in the hospital he started to experience pain in his Buttock. Eventually he was able to get the attention of the wound nurse who thought this was a friction injury was given barrier cream. By the time he was discharged from hospital and got home he continued to complain of pain to his wife and a noted significant injury in the area. He was seen by his primary doctor about 4 noted a stage II pressure ulcer did some debridement and prescribed Keflex. He is not doing any  specific dressings topically to the wound and he is referred here for review of this. The patient has a gastric sleeve and is already on a liquid diet. He receives feeding through his jejunostomy tube at night to supplement this. From this point of view he appears to be doing well 02/20/16; the patient continues to improve his diet has been augmented. Pressure areas have also improved on his gluteal Electronic Signature(s) Signed: 02/20/2016 6:13:30 PM By: Linton Ham MD Entered By: Linton Ham on 02/20/2016 12:54:15 Broady, [name] ([id number]) -------------------------------------------------------------------------------- Physical Exam Details Patient Name: [name]. Date of Service: 02/20/2016 8:45 AM Medical Record Patient Account Number: [id number] Number: Treating RN: Ahmed Prima 1950-07-05 (65 y.o. Other Clinician: Date of Birth/Sex: Male) Treating [name], [name] Primary Care Physician: Garret Reddish Physician/Extender: G Referring Physician: Garret Reddish Weeks in Treatment: 1 Constitutional Sitting or standing Blood Pressure is within target range for patient.. Pulse regular and within target range for patient.Marland Kitchen Respirations regular, non-labored and within target range.. Temperature is normal and within the target range for the patient.. Patient's appearance is neat and clean. Appears in no acute distress. Well nourished and well developed.. Notes Wound exam; a large area of this patient's bilateral buttock wounds have fully epithelialized and look a lot better. He still has a smaller linear area on the right gluteal. This appears to be healthy no debridement was required. There is no tenderness. Last week this was a fairly substantial inverted V shape wound with the tip of the V over his coccyx and the arms down around his surrounding gluteals. All of this looks a lot better today Electronic Signature(s) Signed: 02/20/2016 6:13:30 PM  By: Linton Ham MD Entered By: Linton Ham on 02/20/2016 12:55:42 [name] ([id number]) -------------------------------------------------------------------------------- Physician Orders Details Patient Name: [name] Date of Service: 02/20/2016 8:45 AM Medical Record Patient Account Number: [id number]  [id number] Number: Treating RN: Ahmed Prima July 13, 1950 (65 y.o. Other Clinician: Date of Birth/Sex: Male) Treating [name], [name] Primary Care Physician: Garret Reddish Physician/Extender: G Referring Physician: Kaylyn Layer in Treatment: 1 Verbal / Phone Orders: Yes Clinician: Pinkerton, Debi Read Back and Verified: Yes Diagnosis Coding Wound Cleansing Wound #1 Midline Gluteus o Clean wound with Normal Saline. Anesthetic Wound #1 Midline Gluteus o Topical Lidocaine 4% cream applied to wound bed prior to debridement - CLINIC USE ONLY Skin Barriers/Peri-Wound Care Wound #1 Midline Gluteus o Skin Prep Primary Wound Dressing Wound #1 Midline Gluteus o Promogran - MOISTEN WITH SALINE Secondary Dressing Wound #1 Midline Gluteus o Dry Gauze o Boardered Foam Dressing Dressing Change Frequency Wound #1 Midline Gluteus o Change dressing every other day. Follow-up Appointments Wound #1 Midline Gluteus o Return Appointment in 1 week. Off-Loading Wound #1 Midline Gluteus o Turn and reposition every 2 hours ARLANDO, PHARES ([id number]) Electronic Signature(s) Signed: 02/20/2016 5:37:51 PM By: Alric Quan Signed: 02/20/2016 6:13:30 PM By: Linton Ham MD Entered By: Alric Quan on 02/20/2016 09:05:23 [name] ([id number]) -------------------------------------------------------------------------------- Problem List Details Patient Name: [name]. Date of Service: 02/20/2016 8:45 AM Medical Record Patient Account Number: [id number] Number: Treating RN: Ahmed Prima 04-12-1951 (65  y.o. Other Clinician: Date of Birth/Sex: Male) Treating [name], [name] Primary Care Physician: Garret Reddish Physician/Extender: G Referring Physician: Garret Reddish Weeks in Treatment: 1 Active Problems ICD-10 Encounter Code Description Active Date Diagnosis L89.322 Pressure ulcer of left buttock, stage 2 02/12/2016 Yes L89.311 Pressure ulcer of right buttock, stage 1 02/12/2016 Yes Inactive Problems Resolved Problems Electronic Signature(s) Signed: 02/20/2016 6:13:30 PM By: Linton Ham MD Entered By: Linton Ham on 02/20/2016 12:51:49 Akerson, [name] ([id number]) -------------------------------------------------------------------------------- Progress Note Details Patient Name: [name] Date of Service: 02/20/2016 8:45 AM Medical Record Patient Account Number: [id number] Number: Treating RN: Ahmed Prima 1950-06-11 (65 y.o. Other Clinician: Date of Birth/Sex: Male) Treating [name], [name] Primary Care Physician: Garret Reddish Physician/Extender: G Referring Physician: Kaylyn Layer in Treatment: 1 Subjective Chief Complaint Information obtained from Patient 02/12/16; patient is here for review of her pressure ulcer on his buttock History of Present Illness (HPI) 02/12/16; this is a patient who was [facility] in late September and underwent esophagectomy for esophageal cancer on September 29. Several days into this stay in the hospital he started to experience pain in his Buttock. Eventually he was able to get the attention of the wound nurse who thought this was a friction injury was given barrier cream. By the time he was discharged from hospital and got home he continued to complain of pain to his wife and a noted significant injury in the area. He was seen by his primary doctor about 4 noted a stage II pressure ulcer did some debridement and prescribed Keflex. He is not doing any specific dressings topically  to the wound and he is referred here for review of this. The patient has a gastric sleeve and is already on a liquid diet. He receives feeding through his jejunostomy tube at night to supplement this. From this point of view he appears to be doing well 02/20/16; the patient continues to improve his diet has been augmented. Pressure areas have also improved on his gluteal Objective Constitutional Sitting or standing Blood Pressure is within target range for patient.. Pulse regular and within target range for patient.Marland Kitchen Respirations regular, non-labored and within target range.. Temperature is normal and within the target range for the patient.Marland Kitchen  Patient's appearance is neat and clean. Appears in no acute distress. Well nourished and well developed.. Vitals Time Taken: 8:49 AM, Height: 73 in, Weight: 187 lbs, BMI: 24.7, Temperature: 97.7 F, Pulse: 86 bpm, Respiratory Rate: 18 breaths/min, Blood Pressure: 132/83 mmHg. SILBERIO, VISITACION ([id number]) General Notes: Wound exam; a large area of this patient's bilateral buttock wounds have fully epithelialized and look a lot better. He still has a smaller linear area on the right gluteal. This appears to be healthy no debridement was required. There is no tenderness. Last week this was a fairly substantial inverted V shape wound with the tip of the V over his coccyx and the arms down around his surrounding gluteals. All of this looks a lot better today Integumentary (Hair, Skin) Wound #1 status is Open. Original cause of wound was Pressure Injury. The wound is located on the Midline Gluteus. The wound measures 1.9cm length x 0.4cm width x 0.1cm depth; 0.597cm^2 area and 0.06cm^3 volume. The wound is limited to skin breakdown. There is no tunneling or undermining noted. There is a medium amount of serous drainage noted. The wound margin is distinct with the outline attached to the wound base. There is medium (34-66%) pink granulation within the wound  bed. There is a medium (34-66%) amount of necrotic tissue within the wound bed including Adherent Slough. The periwound skin appearance exhibited: Moist. Periwound temperature was noted as No Abnormality. The periwound has tenderness on palpation. Assessment Active Problems ICD-10 L89.322 - Pressure ulcer of left buttock, stage 2 L89.311 - Pressure ulcer of right buttock, stage 1 Plan Wound Cleansing: Wound #1 Midline Gluteus: Clean wound with Normal Saline. Anesthetic: Wound #1 Midline Gluteus: Topical Lidocaine 4% cream applied to wound bed prior to debridement - CLINIC USE ONLY Skin Barriers/Peri-Wound Care: Wound #1 Midline Gluteus: Skin Prep Primary Wound Dressing: Wound #1 Midline Gluteus: Promogran - MOISTEN WITH SALINE Secondary Dressing: Wound #1 Midline Gluteus: Dry Gauze Mczeal, Markas J. ([id number]) Boardered Foam Dressing Dressing Change Frequency: Wound #1 Midline Gluteus: Change dressing every other day. Follow-up Appointments: Wound #1 Midline Gluteus: Return Appointment in 1 week. Off-Loading: Wound #1 Midline Gluteus: Turn and reposition every 2 hours #1 the patient's wounds look a lot better. We can still continue with them Promogran moistened with saline with a border foam dressing. The patient is been meticulous about offloading this and I think this should be healed by next week Electronic Signature(s) Signed: 02/20/2016 6:13:30 PM By: Linton Ham MD Entered By: Linton Ham on 02/20/2016 12:56:47 Mcnee, [name] ([id number]) -------------------------------------------------------------------------------- [place] Details Patient Name: [name]. Date of Service: 02/20/2016 Medical Record Patient Account Number: [id number] Number: Treating RN: Ahmed Prima Sep 15, 1950 (65 y.o. Other Clinician: Date of Birth/Sex: Male) Treating [name], [name] Primary Care Physician: Garret Reddish Physician/Extender: G Referring  Physician: Garret Reddish Weeks in Treatment: 1 Diagnosis Coding ICD-10 Codes Code Description [phone] Pressure ulcer of left buttock, stage 2 L89.311 Pressure ulcer of right buttock, stage 1 Facility Procedures CPT4 Code: [id number] Description: 99213 - WOUND CARE VISIT-LEV 3 EST PT Modifier: Quantity: 1 Physician Procedures CPT4 Code: [id number] Description: [id number] - WC PHYS LEVEL 2 - EST PT ICD-10 Description Diagnosis L89.322 Pressure ulcer of left buttock, stage 2 Modifier: Quantity: 1 Electronic Signature(s) Signed: 02/20/2016 5:37:51 PM By: Alric Quan Signed: 02/20/2016 6:13:30 PM By: Linton Ham MD Entered By: Alric Quan on 02/20/2016 13:21:09

## 2016-02-27 ENCOUNTER — Encounter: Payer: Medicare Other | Attending: Internal Medicine | Admitting: Internal Medicine

## 2016-02-27 DIAGNOSIS — L89322 Pressure ulcer of left buttock, stage 2: Secondary | ICD-10-CM | POA: Insufficient documentation

## 2016-02-27 DIAGNOSIS — Z9049 Acquired absence of other specified parts of digestive tract: Secondary | ICD-10-CM | POA: Insufficient documentation

## 2016-02-27 DIAGNOSIS — Z8501 Personal history of malignant neoplasm of esophagus: Secondary | ICD-10-CM | POA: Insufficient documentation

## 2016-02-27 DIAGNOSIS — L89311 Pressure ulcer of right buttock, stage 1: Secondary | ICD-10-CM | POA: Diagnosis not present

## 2016-02-28 NOTE — Progress Notes (Signed)
[name], [name] ([id number]) Visit Report for 02/27/2016 Chief Complaint Document Details Patient Name: [name], [name]. Date of Service: 02/27/2016 2:15 PM Medical Record Patient Account Number: [id number] Number: Treating RN: Ahmed Prima [birth year] (65 y.o. Other Clinician: Date of Birth/Sex: Male) Treating [name], [name] Primary Care Physician: Garret Reddish Physician/Extender: G Referring Physician: Kaylyn Layer in Treatment: 2 Information Obtained from: Patient Chief Complaint 02/12/16; patient is here for review of her pressure ulcer on his buttock Electronic Signature(s) Signed: 02/27/2016 5:04:57 PM By: Linton Ham MD Entered By: Linton Ham on 02/27/2016 16:23:12 [name], [name] ([id number]) -------------------------------------------------------------------------------- HPI Details Patient Name: [name] Date of Service: 02/27/2016 2:15 PM Medical Record Patient Account Number: [id number] Number: Treating RN: Ahmed Prima 1950-10-07 (65 y.o. Other Clinician: Date of Birth/Sex: Male) Treating [name], [name] Primary Care Physician: Garret Reddish Physician/Extender: G Referring Physician: Kaylyn Layer in Treatment: 2 History of Present Illness HPI Description: 02/12/16; this is a patient who was [facility] in [facility] and underwent esophagectomy for esophageal cancer on September 29. Several days into this stay in the hospital he started to experience pain in his Buttock. Eventually he was able to get the attention of the wound nurse who thought this was a friction injury was given barrier cream. By the time he was discharged from hospital and got home he continued to complain of pain to his wife and a noted significant injury in the area. He was seen by his primary doctor about 4 noted a stage II pressure ulcer did some debridement and prescribed Keflex. He is not doing any specific  dressings topically to the wound and he is referred here for review of this. The patient has a gastric sleeve and is already on a liquid diet. He receives feeding through his jejunostomy tube at night to supplement this. From this point of view he appears to be doing well 02/20/16; the patient continues to improve his diet has been augmented. Pressure areas have also improved on his gluteal 02/27/16: The patient now has no open area. Her is still some irritation especially on the right gluteal fold however this is better than last time. He still states that when he leans back on his coccyx this hurts Electronic Signature(s) Signed: 02/27/2016 5:04:57 PM By: Linton Ham MD Entered By: Linton Ham on 02/27/2016 16:24:23 [name] ([id number]) -------------------------------------------------------------------------------- Physical Exam Details Patient Name: [name], [name]. Date of Service: 02/27/2016 2:15 PM Medical Record Patient Account Number: [id number] Number: Treating RN: Ahmed Prima 08/09/1950 (65 y.o. Other Clinician: Date of Birth/Sex: Male) Treating [name], [name] Primary Care Physician: Garret Reddish Physician/Extender: G Referring Physician: Garret Reddish Weeks in Treatment: 2 Constitutional Sitting or standing Blood Pressure is within target range for patient.. Pulse regular and within target range for patient.Marland Kitchen Respirations regular, non-labored and within target range.. Temperature is normal and within the target range for the patient.. Patient's appearance is neat and clean. Appears in no acute distress. Well nourished and well developed.. Notes Wound exam; the areas are fully epithelialized. Especially on the right gluteal there is still some pink discoloration I suspect this is still a pressure-related issue. There is no moisture no tenderness and no evidence of infection. He came in here with a fairly substantial inverted the wound  with the tip of the V over his coccyx all of this is totally epithelialized. This is really a good outcome. Electronic Signature(s) Signed: 02/27/2016 5:04:57 PM By: Linton Ham MD Entered By: Dellia Nims,  [name] on 02/27/2016 16:25:32 [name], [name] ([id number]) -------------------------------------------------------------------------------- Physician Orders Details Patient Name: [name], [name]. Date of Service: 02/27/2016 2:15 PM Medical Record Patient Account Number: [id number] Number: Treating RN: Ahmed Prima 11-04-1950 (65 y.o. Other Clinician: Date of Birth/Sex: Male) Treating [name], [name] Primary Care Physician: Garret Reddish Physician/Extender: G Referring Physician: Kaylyn Layer in Treatment: 2 Verbal / Phone Orders: Yes Clinician: Carolyne Fiscal, Debi Read Back and Verified: Yes Diagnosis Coding Discharge From [facility] Services o Discharge from [place] pressure off of the areas. Turn and reposition every 2 hrs or more often. Keep area clean and dry. Call our office if you have any questions or concerns. Electronic Signature(s) Signed: 02/27/2016 5:04:57 PM By: Linton Ham MD Signed: 02/27/2016 5:08:45 PM By: Alric Quan Entered By: Alric Quan on 02/27/2016 14:41:46 [name], [name] ([id number]) -------------------------------------------------------------------------------- Problem List Details Patient Name: [name], [name]. Date of Service: 02/27/2016 2:15 PM Medical Record Patient Account Number: [id number] Number: Treating RN: Ahmed Prima 25-Jul-1950 (65 y.o. Other Clinician: Date of Birth/Sex: Male) Treating [name], [name] Primary Care Physician: Garret Reddish Physician/Extender: G Referring Physician: Garret Reddish Weeks in Treatment: 2 Active Problems ICD-10 Encounter Code Description Active Date Diagnosis L89.322 Pressure ulcer of left buttock, stage 2 02/12/2016 Yes L89.311  Pressure ulcer of right buttock, stage 1 02/12/2016 Yes Inactive Problems Resolved Problems Electronic Signature(s) Signed: 02/27/2016 5:04:57 PM By: Linton Ham MD Entered By: Linton Ham on 02/27/2016 16:22:58 [name], [name] ([id number]) -------------------------------------------------------------------------------- Progress Note Details Patient Name: [name]. Date of Service: 02/27/2016 2:15 PM Medical Record Patient Account Number: [id number] Number: Treating RN: Ahmed Prima [birth year] (65 y.o. Other Clinician: Date of Birth/Sex: Male) Treating [name], [name] Primary Care Physician: Garret Reddish Physician/Extender: G Referring Physician: Kaylyn Layer in Treatment: 2 Subjective Chief Complaint Information obtained from Patient 02/12/16; patient is here for review of her pressure ulcer on his buttock History of Present Illness (HPI) 02/12/16; this is a patient who was [facility] in late September and underwent esophagectomy for esophageal cancer on September 29. Several days into this stay in the hospital he started to experience pain in his Buttock. Eventually he was able to get the attention of the wound nurse who thought this was a friction injury was given barrier cream. By the time he was discharged from hospital and got home he continued to complain of pain to his wife and a noted significant injury in the area. He was seen by his primary doctor about 4 noted a stage II pressure ulcer did some debridement and prescribed Keflex. He is not doing any specific dressings topically to the wound and he is referred here for review of this. The patient has a gastric sleeve and is already on a liquid diet. He receives feeding through his jejunostomy tube at night to supplement this. From this point of view he appears to be doing well 02/20/16; the patient continues to improve his diet has been augmented. Pressure areas have  also improved on his gluteal 02/27/16: The patient now has no open area. Her is still some irritation especially on the right gluteal fold however this is better than last time. He still states that when he leans back on his coccyx this hurts Objective Constitutional Sitting or standing Blood Pressure is within target range for patient.. Pulse regular and within target range for patient.Marland Kitchen Respirations regular, non-labored and within target range.. Temperature is normal and within the target range for the patient.. Patient's  appearance is neat and clean. Appears in no acute distress. Well nourished and well developed.. Vitals Time Taken: 2:22 PM, Height: 73 in, Weight: 187 lbs, BMI: 24.7, Temperature: 98.0 F, Pulse: 83 bpm, Respiratory Rate: 18 breaths/min, Blood Pressure: 131/79 mmHg. [name], [name] ([id number]) General Notes: Wound exam; the areas are fully epithelialized. Especially on the right gluteal there is still some pink discoloration I suspect this is still a pressure-related issue. There is no moisture no tenderness and no evidence of infection. He came in here with a fairly substantial inverted the wound with the tip of the V over his coccyx all of this is totally epithelialized. This is really a good outcome. Integumentary (Hair, Skin) Wound #1 status is Open. Original cause of wound was Pressure Injury. The wound is located on the Midline Gluteus. The wound measures 0cm length x 0cm width x 0cm depth; 0cm^2 area and 0cm^3 volume. The wound is limited to skin breakdown. There is no tunneling or undermining noted. There is a none present amount of drainage noted. The wound margin is distinct with the outline attached to the wound base. There is no granulation within the wound bed. There is no necrotic tissue within the wound bed. The periwound skin appearance did not exhibit: Moist. Periwound temperature was noted as No Abnormality. The periwound has tenderness on  palpation. Assessment Active Problems ICD-10 L89.322 - Pressure ulcer of left buttock, stage 2 L89.311 - Pressure ulcer of right buttock, stage 1 Plan Discharge From [facility] Services: Discharge from [name] pressure off of the areas. Turn and reposition every 2 hrs or more often. Keep area clean and dry. Call our office if you have any questions or concerns. #1 I think the patient can be discharged today. This is simply a matter of offloading. I answered many questions from the patient and his wife. No specific dressing is required line #2 he can be discharged from the clinic [name], [name] ([id number]) Electronic Signature(s) Signed: 02/27/2016 5:04:57 PM By: Linton Ham MD Entered By: Linton Ham on 02/27/2016 16:26:05 [name] ([id number]) -------------------------------------------------------------------------------- [place] Details Patient Name: [name] Date of Service: 02/27/2016 Medical Record Patient Account Number: [id number] Number: Treating RN: Ahmed Prima April 14, 1951 (65 y.o. Other Clinician: Date of Birth/Sex: Male) Treating [name], [name] Primary Care Physician: Garret Reddish Physician/Extender: G Referring Physician: Garret Reddish Weeks in Treatment: 2 Diagnosis Coding ICD-10 Codes Code Description [phone] Pressure ulcer of left buttock, stage 2 L89.311 Pressure ulcer of right buttock, stage 1 Facility Procedures CPT4 Code: [id number] Description: [phone] - WOUND CARE VISIT-LEV 2 EST PT Modifier: Quantity: 1 Physician Procedures CPT4 Code: [id number] Description: [id number] - WC PHYS LEVEL 2 - EST PT ICD-10 Description Diagnosis L89.311 Pressure ulcer of right buttock, stage 1 Modifier: Quantity: 1 Electronic Signature(s) Signed: 02/27/2016 5:04:57 PM By: Linton Ham MD Entered By: Linton Ham on 02/27/2016 16:26:39

## 2016-02-28 NOTE — Progress Notes (Signed)
[name] ([id number]) Visit Report for 02/27/2016 Arrival Information Details Patient Name: [name]. Date of Service: 02/27/2016 2:15 PM Medical Record Number: [id number] Patient Account Number: [id number] Date of Birth/Sex: 01/02/1951 (65 y.o. Male) Treating RN: Ahmed Prima Primary Care Physician: Garret Reddish Other Clinician: Referring Physician: Garret Reddish Treating Physician/Extender: Tito Dine in Treatment: 2 Visit Information History Since Last Visit All ordered tests and consults were completed: No Patient Arrived: Ambulatory Added or deleted any medications: No Arrival Time: 14:21 Any new allergies or adverse reactions: No Accompanied By: wife Had a fall or experienced change in No Transfer Assistance: None activities of daily living that may affect Patient Identification Verified: Yes risk of falls: Secondary Verification Process Yes Signs or symptoms of abuse/neglect since last No Completed: visito Patient Requires Transmission-Based No Hospitalized since last visit: No Precautions: Pain Present Now: No Patient Has Alerts: No Electronic Signature(s) Signed: 02/27/2016 5:08:45 PM By: Alric Quan Entered By: Alric Quan on 02/27/2016 14:22:03 [name] ([id number]) -------------------------------------------------------------------------------- Clinic Level of Care Assessment Details Patient Name: [name] Date of Service: 02/27/2016 2:15 PM Medical Record Number: [id number] Patient Account Number: [id number] Date of Birth/Sex: 03-Jun-1950 (65 y.o. Male) Treating RN: Carolyne Fiscal, Debi Primary Care Physician: Garret Reddish Other Clinician: Referring Physician: Garret Reddish Treating Physician/Extender: Tito Dine in Treatment: 2 Clinic Level of Care Assessment Items TOOL 4 Quantity Score X - Use when only an EandM is performed on FOLLOW-UP visit 1 0 ASSESSMENTS - Nursing Assessment /  Reassessment X - Reassessment of Co-morbidities (includes updates in patient status) 1 10 X - Reassessment of Adherence to Treatment Plan 1 5 ASSESSMENTS - Wound and Skin Assessment / Reassessment X - Simple Wound Assessment / Reassessment - one wound 1 5 []  - Complex Wound Assessment / Reassessment - multiple wounds 0 []  - Dermatologic / Skin Assessment (not related to wound area) 0 ASSESSMENTS - Focused Assessment []  - Circumferential Edema Measurements - multi extremities 0 []  - Nutritional Assessment / Counseling / Intervention 0 []  - Lower Extremity Assessment (monofilament, tuning fork, pulses) 0 []  - Peripheral Arterial Disease Assessment (using hand held doppler) 0 ASSESSMENTS - Ostomy and/or Continence Assessment and Care []  - Incontinence Assessment and Management 0 []  - Ostomy Care Assessment and Management (repouching, etc.) 0 PROCESS - Coordination of Care X - Simple Patient / Family Education for ongoing care 1 15 []  - Complex (extensive) Patient / Family Education for ongoing care 0 X - Staff obtains Programmer, systems, Records, Test Results / Process Orders 1 10 []  - Staff telephones HHA, Nursing Homes / Clarify orders / etc 0 []  - Routine Transfer to another Facility (non-emergent condition) 0 [name], [name] ([id number]) []  - Routine Hospital Admission (non-emergent condition) 0 []  - New Admissions / Biomedical engineer / Ordering NPWT, Apligraf, etc. 0 []  - Emergency Hospital Admission (emergent condition) 0 X - Simple Discharge Coordination 1 10 []  - Complex (extensive) Discharge Coordination 0 PROCESS - Special Needs []  - Pediatric / Minor Patient Management 0 []  - Isolation Patient Management 0 []  - Hearing / Language / Visual special needs 0 []  - Assessment of Community assistance (transportation, D/C planning, etc.) 0 []  - Additional assistance / Altered mentation 0 []  - Support Surface(s) Assessment (bed, cushion, seat, etc.) 0 INTERVENTIONS - Wound Cleansing /  Measurement X - Simple Wound Cleansing - one wound 1 5 []  - Complex Wound Cleansing - multiple wounds 0 X - Wound Imaging (photographs - any number of wounds)  1 5 []  - Wound Tracing (instead of photographs) 0 []  - Simple Wound Measurement - one wound 0 []  - Complex Wound Measurement - multiple wounds 0 INTERVENTIONS - Wound Dressings []  - Small Wound Dressing one or multiple wounds 0 []  - Medium Wound Dressing one or multiple wounds 0 []  - Large Wound Dressing one or multiple wounds 0 []  - Application of Medications - topical 0 []  - Application of Medications - injection 0 INTERVENTIONS - Miscellaneous []  - External ear exam 0 [name], [name] ([id number]) []  - Specimen Collection (cultures, biopsies, blood, body fluids, etc.) 0 []  - Specimen(s) / Culture(s) sent or taken to Lab for analysis 0 []  - Patient Transfer (multiple staff / Harrel Lemon Lift / Similar devices) 0 []  - Simple Staple / Suture removal (25 or less) 0 []  - Complex Staple / Suture removal (26 or more) 0 []  - Hypo / Hyperglycemic Management (close monitor of Blood Glucose) 0 []  - Ankle / Brachial Index (ABI) - do not check if billed separately 0 X - Vital Signs 1 5 Has the patient been seen at the hospital within the last three years: Yes Total Score: 70 Level Of Care: New/Established - Level 2 Electronic Signature(s) Signed: 02/27/2016 5:08:45 PM By: Alric Quan Entered By: Alric Quan on 02/27/2016 16:23:52 [name] ([id number]) -------------------------------------------------------------------------------- Encounter Discharge Information Details Patient Name: [name]. Date of Service: 02/27/2016 2:15 PM Medical Record Number: [id number] Patient Account Number: [id number] Date of Birth/Sex: 1950-06-15 (65 y.o. Male) Treating RN: Ahmed Prima Primary Care Physician: Garret Reddish Other Clinician: Referring Physician: Garret Reddish Treating Physician/Extender: Tito Dine  in Treatment: 2 Encounter Discharge Information Items Discharge Pain Level: 0 Discharge Condition: Stable Ambulatory Status: Ambulatory Discharge Destination: Home Transportation: Private Auto Accompanied By: self Schedule Follow-up Appointment: Yes Medication Reconciliation completed and provided to Patient/Care Yes Isreal Moline: Provided on Clinical Summary of Care: 02/27/2016 Form Type Recipient Paper Patient DM Electronic Signature(s) Signed: 02/27/2016 2:53:13 PM By: Ruthine Dose Entered By: Ruthine Dose on 02/27/2016 14:53:13 Banwart, Reggie Davidson ([id number]) -------------------------------------------------------------------------------- Lower Extremity Assessment Details Patient Name: [name]. Date of Service: 02/27/2016 2:15 PM Medical Record Number: [id number] Patient Account Number: [id number] Date of Birth/Sex: December 10, 1950 (65 y.o. Male) Treating RN: Ahmed Prima Primary Care Physician: Garret Reddish Other Clinician: Referring Physician: Garret Reddish Treating Physician/Extender: Ricard Dillon Weeks in Treatment: 2 Electronic Signature(s) Signed: 02/27/2016 5:08:45 PM By: Alric Quan Entered By: Alric Quan on 02/27/2016 14:24:07 Zagal, Reggie Davidson ([id number]) -------------------------------------------------------------------------------- Multi Wound Chart Details Patient Name: [name]. Date of Service: 02/27/2016 2:15 PM Medical Record Number: [id number] Patient Account Number: [id number] Date of Birth/Sex: 1950-05-29 (65 y.o. Male) Treating RN: Ahmed Prima Primary Care Physician: Garret Reddish Other Clinician: Referring Physician: Garret Reddish Treating Physician/Extender: Ricard Dillon Weeks in Treatment: 2 Vital Signs Height(in): 73 Pulse(bpm): 83 Weight(lbs): 187 Blood Pressure 131/79 (mmHg): Body Mass Index(BMI): 25 Temperature(F): 98.0 Respiratory Rate 18 (breaths/min): Photos: [1:No Photos]  [N/A:N/A] Wound Location: [1:Gluteus - Midline] [N/A:N/A] Wounding Event: [1:Pressure Injury] [N/A:N/A] Primary Etiology: [1:Pressure Ulcer] [N/A:N/A] Comorbid History: [1:Hypertension, Gout, Received Chemotherapy, Received Radiation] [N/A:N/A] Date Acquired: [1:02/04/2016] [N/A:N/A] Weeks of Treatment: [1:2] [N/A:N/A] Wound Status: [1:Open] [N/A:N/A] Measurements L x W x D 0x0x0 [N/A:N/A] (cm) Area (cm) : [1:0] [N/A:N/A] Volume (cm) : [1:0] [N/A:N/A] % Reduction in Area: [1:100.00%] [N/A:N/A] % Reduction in Volume: 100.00% [N/A:N/A] Classification: [1:Category/Stage II] [N/A:N/A] Exudate Amount: [1:None Present] [N/A:N/A] Wound Margin: [1:Distinct, outline attached] [N/A:N/A] Granulation Amount: [1:None Present (0%)] [N/A:N/A] Necrotic  Amount: [1:None Present (0%)] [N/A:N/A] Exposed Structures: [1:Fascia: No Fat: No Tendon: No Muscle: No Joint: No Bone: No Limited to Skin Breakdown] [N/A:N/A] Epithelialization: [1:Large (67-100%)] [N/A:N/A] Periwound Skin Texture: No Abnormalities Noted N/A N/A Periwound Skin Moist: No N/A N/A Moisture: Periwound Skin Color: No Abnormalities Noted N/A N/A Temperature: No Abnormality N/A N/A Tenderness on Yes N/A N/A Palpation: Wound Preparation: Ulcer Cleansing: N/A N/A Rinsed/Irrigated with Saline Topical Anesthetic Applied: None Treatment Notes Electronic Signature(s) Signed: 02/27/2016 5:08:45 PM By: Alric Quan Entered By: Alric Quan on 02/27/2016 14:40:37 Levey, Reggie Davidson ([id number]) -------------------------------------------------------------------------------- [place] Details Patient Name: [name] Date of Service: 02/27/2016 2:15 PM Medical Record Number: [id number] Patient Account Number: [id number] Date of Birth/Sex: Aug 18, 1950 (65 y.o. Male) Treating RN: Ahmed Prima Primary Care Physician: Garret Reddish Other Clinician: Referring Physician: Garret Reddish Treating  Physician/Extender: Ricard Dillon Weeks in Treatment: 2 Active Inactive Electronic Signature(s) Signed: 02/27/2016 5:08:45 PM By: Alric Quan Entered By: Alric Quan on 02/27/2016 14:40:29 Holub, Reggie Davidson ([id number]) -------------------------------------------------------------------------------- Pain Assessment Details Patient Name: [name] Date of Service: 02/27/2016 2:15 PM Medical Record Number: [id number] Patient Account Number: [id number] Date of Birth/Sex: Oct 11, 1950 (65 y.o. Male) Treating RN: Ahmed Prima Primary Care Physician: Garret Reddish Other Clinician: Referring Physician: Garret Reddish Treating Physician/Extender: Ricard Dillon Weeks in Treatment: 2 Active Problems Location of Pain Severity and Description of Pain Patient Has Paino No Site Locations With Dressing Change: No Pain Management and Medication Current Pain Management: Electronic Signature(s) Signed: 02/27/2016 5:08:45 PM By: Alric Quan Entered By: Alric Quan on 02/27/2016 14:22:09 [name] ([id number]) -------------------------------------------------------------------------------- Patient/Caregiver Education Details Patient Name: [name] Date of Service: 02/27/2016 2:15 PM Medical Record Number: [id number] Patient Account Number: [id number] Date of Birth/Gender: [birth year] (65 y.o. Male) Treating RN: Ahmed Prima Primary Care Physician: Garret Reddish Other Clinician: Referring Physician: Garret Reddish Treating Physician/Extender: Tito Dine in Treatment: 2 Education Assessment Education Provided To: Patient Education Topics Provided Wound/Skin Impairment: Handouts: Other: Call our office if you have any questions or concerns. Methods: Demonstration, Explain/Verbal Responses: State content correctly Electronic Signature(s) Signed: 02/27/2016 5:08:45 PM By: Alric Quan Entered By: Alric Quan on  02/27/2016 14:42:32 Fetsch, Reggie Davidson ([id number]) -------------------------------------------------------------------------------- Wound Assessment Details Patient Name: [name]. Date of Service: 02/27/2016 2:15 PM Medical Record Number: [id number] Patient Account Number: [id number] Date of Birth/Sex: Jul 24, 1950 (65 y.o. Male) Treating RN: Ahmed Prima Primary Care Physician: Garret Reddish Other Clinician: Referring Physician: Garret Reddish Treating Physician/Extender: Ricard Dillon Weeks in Treatment: 2 Wound Status Wound Number: 1 Primary Pressure Ulcer Etiology: Wound Location: Gluteus - Midline Wound Open Wounding Event: Pressure Injury Status: Date Acquired: 02/04/2016 Comorbid Hypertension, Gout, Received Weeks Of Treatment: 2 History: Chemotherapy, Received Radiation Clustered Wound: No Photos Photo Uploaded By: Alric Quan on 02/27/2016 15:47:30 Wound Measurements Length: (cm) 0 % Reducti Width: (cm) 0 % Reducti Depth: (cm) 0 Epithelia Area: (cm) 0 Tunnelin Volume: (cm) 0 Undermin on in Area: 100% on in Volume: 100% lization: Large (67-100%) g: No ing: No Wound Description Classification: Category/Stage II Wound Margin: Distinct, outline attached Exudate Amount: None Present Foul Odor After Cleansing: No Wound Bed Granulation Amount: None Present (0%) Exposed Structure Necrotic Amount: None Present (0%) Fascia Exposed: No Fat Layer Exposed: No Tendon Exposed: No Muscle Exposed: No Joint Exposed: No Montanari, Novah J. ([id number]) Bone Exposed: No Limited to Skin Breakdown Periwound Skin Texture Texture Color No Abnormalities Noted: No No Abnormalities Noted: No Moisture Temperature / Pain No  Abnormalities Noted: No Temperature: No Abnormality Moist: No Tenderness on Palpation: Yes Wound Preparation Ulcer Cleansing: Rinsed/Irrigated with Saline Topical Anesthetic Applied: None Electronic Signature(s) Signed: 02/27/2016  5:08:45 PM By: Alric Quan Entered By: Alric Quan on 02/27/2016 14:39:26 Mcqueen, Reggie Davidson ([id number]) -------------------------------------------------------------------------------- Vitals Details Patient Name: [name]. Date of Service: 02/27/2016 2:15 PM Medical Record Number: [id number] Patient Account Number: [id number] Date of Birth/Sex: 1950-11-20 (65 y.o. Male) Treating RN: Carolyne Fiscal, Debi Primary Care Physician: Garret Reddish Other Clinician: Referring Physician: Garret Reddish Treating Physician/Extender: Ricard Dillon Weeks in Treatment: 2 Vital Signs Time Taken: 14:22 Temperature (F): 98.0 Height (in): 73 Pulse (bpm): 83 Weight (lbs): 187 Respiratory Rate (breaths/min): 18 Body Mass Index (BMI): 24.7 Blood Pressure (mmHg): 131/79 Reference Range: 80 - 120 mg / dl Electronic Signature(s) Signed: 02/27/2016 5:08:45 PM By: Alric Quan Entered By: Alric Quan on 02/27/2016 14:24:02

## 2016-03-06 DIAGNOSIS — Z9889 Other specified postprocedural states: Secondary | ICD-10-CM | POA: Diagnosis not present

## 2016-03-06 DIAGNOSIS — Z9049 Acquired absence of other specified parts of digestive tract: Secondary | ICD-10-CM | POA: Diagnosis not present

## 2016-03-06 DIAGNOSIS — Z923 Personal history of irradiation: Secondary | ICD-10-CM | POA: Diagnosis not present

## 2016-03-06 DIAGNOSIS — Z9221 Personal history of antineoplastic chemotherapy: Secondary | ICD-10-CM | POA: Diagnosis not present

## 2016-03-06 DIAGNOSIS — K439 Ventral hernia without obstruction or gangrene: Secondary | ICD-10-CM | POA: Diagnosis not present

## 2016-03-06 DIAGNOSIS — C49A0 Gastrointestinal stromal tumor, unspecified site: Secondary | ICD-10-CM | POA: Diagnosis not present

## 2016-03-06 DIAGNOSIS — Z8501 Personal history of malignant neoplasm of esophagus: Secondary | ICD-10-CM | POA: Diagnosis not present

## 2016-03-06 DIAGNOSIS — Z7982 Long term (current) use of aspirin: Secondary | ICD-10-CM | POA: Diagnosis not present

## 2016-03-06 DIAGNOSIS — C155 Malignant neoplasm of lower third of esophagus: Secondary | ICD-10-CM | POA: Diagnosis not present

## 2016-03-06 DIAGNOSIS — Z08 Encounter for follow-up examination after completed treatment for malignant neoplasm: Secondary | ICD-10-CM | POA: Diagnosis not present

## 2016-03-24 DIAGNOSIS — X32XXXD Exposure to sunlight, subsequent encounter: Secondary | ICD-10-CM | POA: Diagnosis not present

## 2016-03-24 DIAGNOSIS — Z1283 Encounter for screening for malignant neoplasm of skin: Secondary | ICD-10-CM | POA: Diagnosis not present

## 2016-03-24 DIAGNOSIS — L570 Actinic keratosis: Secondary | ICD-10-CM | POA: Diagnosis not present

## 2016-04-07 DIAGNOSIS — K439 Ventral hernia without obstruction or gangrene: Secondary | ICD-10-CM | POA: Diagnosis not present

## 2016-04-15 ENCOUNTER — Encounter: Payer: Medicare Other | Attending: Internal Medicine | Admitting: Internal Medicine

## 2016-04-15 DIAGNOSIS — M109 Gout, unspecified: Secondary | ICD-10-CM | POA: Insufficient documentation

## 2016-04-15 DIAGNOSIS — I10 Essential (primary) hypertension: Secondary | ICD-10-CM | POA: Insufficient documentation

## 2016-04-15 DIAGNOSIS — C159 Malignant neoplasm of esophagus, unspecified: Secondary | ICD-10-CM | POA: Diagnosis not present

## 2016-04-15 DIAGNOSIS — Z9221 Personal history of antineoplastic chemotherapy: Secondary | ICD-10-CM | POA: Diagnosis not present

## 2016-04-15 DIAGNOSIS — K439 Ventral hernia without obstruction or gangrene: Secondary | ICD-10-CM | POA: Insufficient documentation

## 2016-04-15 DIAGNOSIS — Z934 Other artificial openings of gastrointestinal tract status: Secondary | ICD-10-CM | POA: Insufficient documentation

## 2016-04-15 DIAGNOSIS — L89311 Pressure ulcer of right buttock, stage 1: Secondary | ICD-10-CM | POA: Diagnosis not present

## 2016-04-15 DIAGNOSIS — L89329 Pressure ulcer of left buttock, unspecified stage: Secondary | ICD-10-CM | POA: Diagnosis not present

## 2016-04-16 NOTE — Progress Notes (Signed)
TACOREY, COTRONE ([id number]) Visit Report for 04/15/2016 Allergy List Details Patient Name: [name], [name]. Date of Service: 04/15/2016 9:30 AM Medical Record Number: [id number] Patient Account Number: [id number] Date of Birth/Sex: 16-Apr-1951 (65 y.o. Male) Treating RN: Ahmed Prima Primary Care Physician: Garret Reddish Other Clinician: Referring Physician: Garret Reddish Treating Physician/Extender: Ricard Dillon Weeks in Treatment: 0 Allergies Active Allergies fentanyl levofloxacin latex animal dander Allergy Notes Electronic Signature(s) Signed: 04/15/2016 4:44:26 PM By: Alric Quan Entered By: Alric Quan on 04/15/2016 09:25:39 Kwan, [name] ([id number]) -------------------------------------------------------------------------------- Arrival Information Details Patient Name: [name]. Date of Service: 04/15/2016 9:30 AM Medical Record Number: [id number] Patient Account Number: [id number] Date of Birth/Sex: 1951/04/22 (65 y.o. Male) Treating RN: Ahmed Prima Primary Care Physician: Garret Reddish Other Clinician: Referring Physician: Garret Reddish Treating Physician/Extender: Tito Dine in Treatment: 0 Visit Information Patient Arrived: Ambulatory Arrival Time: 09:22 Accompanied By: wife Transfer Assistance: None Patient Identification Verified: Yes Secondary Verification Process Yes Completed: Patient Requires Transmission-Based No Precautions: Patient Has Alerts: No History Since Last Visit All ordered tests and consults were completed: No Added or deleted any medications: No Any new allergies or adverse reactions: No Had a fall or experienced change in activities of daily living that may affect risk of falls: No Signs or symptoms of abuse/neglect since last visito No Hospitalized since last visit: No Pain Present Now: No Electronic Signature(s) Signed: 04/15/2016 4:44:26 PM By: Alric Quan Entered By:  Alric Quan on 04/15/2016 09:24:37 Mciver, [name] ([id number]) -------------------------------------------------------------------------------- Clinic Level of Care Assessment Details Patient Name: [name] Date of Service: 04/15/2016 9:30 AM Medical Record Number: [id number] Patient Account Number: [id number] Date of Birth/Sex: September 30, 1950 (65 y.o. Male) Treating RN: Ahmed Prima Primary Care Physician: Garret Reddish Other Clinician: Referring Physician: Garret Reddish Treating Physician/Extender: Ricard Dillon Weeks in Treatment: 0 Clinic Level of Care Assessment Items TOOL 2 Quantity Score X - Use when only an EandM is performed on the INITIAL visit 1 0 ASSESSMENTS - Nursing Assessment / Reassessment X - General Physical Exam (combine w/ comprehensive assessment (listed just 1 20 below) when performed on new pt. evals) X - Comprehensive Assessment (HX, ROS, Risk Assessments, Wounds Hx, etc.) 1 25 ASSESSMENTS - Wound and Skin Assessment / Reassessment []  - Simple Wound Assessment / Reassessment - one wound 0 []  - Complex Wound Assessment / Reassessment - multiple wounds 0 []  - Dermatologic / Skin Assessment (not related to wound area) 0 ASSESSMENTS - Ostomy and/or Continence Assessment and Care []  - Incontinence Assessment and Management 0 []  - Ostomy Care Assessment and Management (repouching, etc.) 0 PROCESS - Coordination of Care X - Simple Patient / Family Education for ongoing care 1 15 []  - Complex (extensive) Patient / Family Education for ongoing care 0 X - Staff obtains Programmer, systems, Records, Test Results / Process Orders 1 10 []  - Staff telephones HHA, Nursing Homes / Clarify orders / etc 0 []  - Routine Transfer to another Facility (non-emergent condition) 0 []  - Routine Hospital Admission (non-emergent condition) 0 X - New Admissions / Biomedical engineer / Ordering NPWT, Apligraf, etc. 1 15 []  - Emergency Hospital Admission (emergent  condition) 0 X - Simple Discharge Coordination 1 10 Richins, Gussie J. ([id number]) []  - Complex (extensive) Discharge Coordination 0 PROCESS - Special Needs []  - Pediatric / Minor Patient Management 0 []  - Isolation Patient Management 0 []  - Hearing / Language / Visual special needs 0 []  - Assessment of Community assistance (transportation, D/C planning, etc.) 0 []  -  Additional assistance / Altered mentation 0 []  - Support Surface(s) Assessment (bed, cushion, seat, etc.) 0 INTERVENTIONS - Wound Cleansing / Measurement []  - Wound Imaging (photographs - any number of wounds) 0 []  - Wound Tracing (instead of photographs) 0 []  - Simple Wound Measurement - one wound 0 []  - Complex Wound Measurement - multiple wounds 0 []  - Simple Wound Cleansing - one wound 0 []  - Complex Wound Cleansing - multiple wounds 0 INTERVENTIONS - Wound Dressings []  - Small Wound Dressing one or multiple wounds 0 []  - Medium Wound Dressing one or multiple wounds 0 []  - Large Wound Dressing one or multiple wounds 0 []  - Application of Medications - injection 0 INTERVENTIONS - Miscellaneous []  - External ear exam 0 []  - Specimen Collection (cultures, biopsies, blood, body fluids, etc.) 0 []  - Specimen(s) / Culture(s) sent or taken to Lab for analysis 0 []  - Patient Transfer (multiple staff / Civil Service fast streamer / Similar devices) 0 []  - Simple Staple / Suture removal (25 or less) 0 []  - Complex Staple / Suture removal (26 or more) 0 Oak, Kruze J. ([id number]) []  - Hypo / Hyperglycemic Management (close monitor of Blood Glucose) 0 []  - Ankle / Brachial Index (ABI) - do not check if billed separately 0 Has the patient been seen at the hospital within the last three years: Yes Total Score: 95 Level Of Care: New/Established - Level 3 Electronic Signature(s) Signed: 04/15/2016 4:44:26 PM By: Alric Quan Entered By: Alric Quan on 04/15/2016 15:48:30 Barella, [name]  ([id number]) -------------------------------------------------------------------------------- Encounter Discharge Information Details Patient Name: [name] Date of Service: 04/15/2016 9:30 AM Medical Record Number: [id number] Patient Account Number: [id number] Date of Birth/Sex: 01-15-1951 (65 y.o. Male) Treating RN: Ahmed Prima Primary Care Physician: Garret Reddish Other Clinician: Referring Physician: Garret Reddish Treating Physician/Extender: Tito Dine in Treatment: 0 Encounter Discharge Information Items Discharge Pain Level: 0 Discharge Condition: Stable Ambulatory Status: Ambulatory Discharge Destination: Home Transportation: Private Auto Accompanied By: wife Schedule Follow-up Appointment: No Medication Reconciliation completed and provided to Patient/Care No [name]: Provided on Clinical Summary of Care: 04/15/2016 Form Type Recipient Paper Patient DM Electronic Signature(s) Signed: 04/15/2016 10:07:56 AM By: Ruthine Dose Entered By: Ruthine Dose on 04/15/2016 10:07:56 Pardon, [name] ([id number]) -------------------------------------------------------------------------------- Lower Extremity Assessment Details Patient Name: [name]. Date of Service: 04/15/2016 9:30 AM Medical Record Number: [id number] Patient Account Number: [id number] Date of Birth/Sex: 12/29/1950 (65 y.o. Male) Treating RN: Ahmed Prima Primary Care Physician: Garret Reddish Other Clinician: Referring Physician: Garret Reddish Treating Physician/Extender: Ricard Dillon Weeks in Treatment: 0 Electronic Signature(s) Signed: 04/15/2016 4:44:26 PM By: Alric Quan Entered By: Alric Quan on 04/15/2016 09:25:32 Gregory, [name] ([id number]) -------------------------------------------------------------------------------- Multi Wound Chart Details Patient Name: [name]. Date of Service: 04/15/2016 9:30 AM Medical Record Number:  [id number] Patient Account Number: [id number] Date of Birth/Sex: December 22, 1950 (65 y.o. Male) Treating RN: Ahmed Prima Primary Care Physician: Garret Reddish Other Clinician: Referring Physician: Garret Reddish Treating Physician/Extender: Ricard Dillon Weeks in Treatment: 0 Vital Signs Height(in): 73 Pulse(bpm): 67 Weight(lbs): 190 Blood Pressure 147/75 (mmHg): Body Mass Index(BMI): 25 Temperature(F): 97.5 Respiratory Rate 18 (breaths/min): Wound Assessments Treatment Notes Electronic Signature(s) Signed: 04/15/2016 5:07:10 PM By: Linton Ham MD Previous Signature: 04/15/2016 4:44:26 PM Version By: Alric Quan Entered By: Linton Ham on 04/15/2016 16:52:31 Guandique, [name] ([id number]) -------------------------------------------------------------------------------- [place] Details Patient Name: [name], [name]. Date of Service: 04/15/2016 9:30 AM Medical Record Number: [id number] Patient Account Number: [id number] Date of  Birth/Sex: 08/22/1950 (65 y.o. Male) Treating RN: Ahmed Prima Primary Care Physician: Garret Reddish Other Clinician: Referring Physician: Garret Reddish Treating Physician/Extender: Ricard Dillon Weeks in Treatment: 0 Active Inactive Electronic Signature(s) Signed: 04/15/2016 4:44:26 PM By: Alric Quan Entered By: Alric Quan on 04/15/2016 09:44:34 Erdahl, Zayon J. ([id number]) -------------------------------------------------------------------------------- Non-Wound Condition Assessment Details Patient Name: [name]. Date of Service: 04/15/2016 9:30 AM Medical Record Number: [id number] Patient Account Number: [id number] Date of Birth/Sex: 1951/04/04 (65 y.o. Male) Treating RN: Ahmed Prima Primary Care Physician: Garret Reddish Other Clinician: Referring Physician: Garret Reddish Treating Physician/Extender: Ricard Dillon Weeks in Treatment: 0 Non-Wound  Condition: Condition: Other Dermatologic Condition Location: Other: sacral area Side: Periwound Skin Texture Texture Color No Abnormalities Noted: No No Abnormalities Noted: No Moisture No Abnormalities Noted: No Notes Area is dry and reddened slightly but blanchable. Talked to pt about keep pressure off of the area. Electronic Signature(s) Signed: 04/15/2016 4:44:26 PM By: Alric Quan Entered By: Alric Quan on 04/15/2016 09:53:40 Bruns, [name] ([id number]) -------------------------------------------------------------------------------- Pain Assessment Details Patient Name: [name] Date of Service: 04/15/2016 9:30 AM Medical Record Number: [id number] Patient Account Number: [id number] Date of Birth/Sex: [birth year] (65 y.o. Male) Treating RN: Ahmed Prima Primary Care Physician: Garret Reddish Other Clinician: Referring Physician: Garret Reddish Treating Physician/Extender: Ricard Dillon Weeks in Treatment: 0 Active Problems Location of Pain Severity and Description of Pain Patient Has Paino No Site Locations With Dressing Change: No Pain Management and Medication Current Pain Management: Electronic Signature(s) Signed: 04/15/2016 4:44:26 PM By: Alric Quan Entered By: Alric Quan on 04/15/2016 09:24:42 Siegrist, [name] ([id number]) -------------------------------------------------------------------------------- Patient/Caregiver Education Details Patient Name: [name] Date of Service: 04/15/2016 9:30 AM Medical Record Number: [id number] Patient Account Number: [id number] Date of Birth/Gender: 04-17-1951 (65 y.o. Male) Treating RN: Ahmed Prima Primary Care Physician: Garret Reddish Other Clinician: Referring Physician: Garret Reddish Treating Physician/Extender: Tito Dine in Treatment: 0 Education Assessment Education Provided To: Patient Education Topics Provided Wound/Skin  Impairment: Handouts: Other: Call our off if you have any questions or concerns. Methods: Explain/Verbal Responses: State content correctly Electronic Signature(s) Signed: 04/15/2016 4:44:26 PM By: Alric Quan Entered By: Alric Quan on 04/15/2016 09:44:08 Hajjar, [name] ([id number]) -------------------------------------------------------------------------------- [place] Details Patient Name: [name] Date of Service: 04/15/2016 9:30 AM Medical Record Number: [id number] Patient Account Number: [id number] Date of Birth/Sex: March 06, 1951 (65 y.o. Male) Treating RN: Carolyne Fiscal, Debi Primary Care Physician: Garret Reddish Other Clinician: Referring Physician: Garret Reddish Treating Physician/Extender: Ricard Dillon Weeks in Treatment: 0 Vital Signs Time Taken: 09:24 Temperature (F): 97.5 Height (in): 73 Pulse (bpm): 67 Source: Stated Respiratory Rate (breaths/min): 18 Weight (lbs): 190 Blood Pressure (mmHg): 147/75 Source: Measured Reference Range: 80 - 120 mg / dl Body Mass Index (BMI): 25.1 Electronic Signature(s) Signed: 04/15/2016 4:44:26 PM By: Alric Quan Entered By: Alric Quan on 04/15/2016 09:25:26

## 2016-04-16 NOTE — Progress Notes (Addendum)
[name] ([id number]) Visit Report for 04/15/2016 Chief Complaint Document Details Patient Name: [name], [name]. Date of Service: 04/15/2016 9:30 AM Medical Record Patient Account Number: [id number] Number: Treating RN: [name] 11-Oct-1950 (65 y.o. Other Clinician: Date of Birth/Sex: Male) Treating [name] Primary Care Physician: Garret Reddish Physician/Extender: G Referring Physician: Garret Reddish Weeks in Treatment: 0 Information Obtained from: Patient Chief Complaint 02/12/16; patient is here for review of her pressure ulcer on his buttock 04/15/16; patient is here for review of his previous pressure ulcer area on the left buttock although there is no open wound today. Electronic Signature(s) Signed: 04/15/2016 5:07:10 PM By: Linton Ham MD Entered By: Linton Ham on 04/15/2016 16:53:16 [name], [name] ([id number]) -------------------------------------------------------------------------------- HPI Details Patient Name: [name] Date of Service: 04/15/2016 9:30 AM Medical Record Patient Account Number: [id number] Number: Treating RN: [name] March 20, 1951 (65 y.o. Other Clinician: Date of Birth/Sex: Male) Treating [name] Primary Care Physician: Garret Reddish Physician/Extender: G Referring Physician: Garret Reddish Weeks in Treatment: 0 History of Present Illness HPI Description: 02/12/16; this is a patient who was [facility] to [facility] in late September and underwent esophagectomy for esophageal cancer on September 29. Several days into this stay in the hospital he started to experience pain in his Buttock. Eventually he was able to get the attention of the wound nurse who thought this was a friction injury was given barrier cream. By the time he was discharged from hospital and got home he continued to complain of pain to his wife and a noted significant injury in the area. He was  seen by his primary doctor about 4 noted a stage II pressure ulcer did some debridement and prescribed Keflex. He is not doing any specific dressings topically to the wound and he is referred here for review of this. The patient has a gastric sleeve and is already on a liquid diet. He receives feeding through his jejunostomy tube at night to supplement this. From this point of view he appears to be doing well 02/20/16; the patient continues to improve his diet has been augmented. Pressure areas have also improved on his gluteal 02/27/16: The patient now has no open area. Her is still some irritation especially on the right gluteal fold however this is better than last time. He still states that when he leans back on his coccyx this hurts 04/15/16; this is a patient I cared for for several weeks ending about 6 weeks ago. At that point he had a pressure ulcer obtained while he was undergoing an esophagectomy for esophageal cancer at [facility]. We're able to get this to close over. He is here out of several concerns accompanied by his wife. Firstly he has episodic complaints of pain in the area which she is a shooting quality pain not sustained. It does not keep him awake at night. He also has concerns about some degree of continued erythema. Finally he is going for a repeat repair of a large ventral abdominal hernia and they're wondering if there are any concerns relative to this Electronic Signature(s) Signed: 04/15/2016 5:07:10 PM By: Linton Ham MD Entered By: Linton Ham on 04/15/2016 16:55:02 [name], [name] ([id number]) -------------------------------------------------------------------------------- Physical Exam Details Patient Name: [name]. Date of Service: 04/15/2016 9:30 AM Medical Record Patient Account Number: [id number] Number: Treating RN: [name] [birth year] (65 y.o. Other Clinician: Date of Birth/Sex: Male) Treating [name],  Davidson Primary Care Physician: Garret Reddish Physician/Extender: G Referring Physician: Yong Channel,  STEPHEN Weeks in Treatment: 0 Constitutional Patient is hypertensive.. Pulse regular and within target range for patient.Marland Kitchen Respirations regular, non-labored and within target range.. Temperature is normal and within the target range for the patient.. Patient's appearance is neat and clean. Appears in no acute distress. Well nourished and well developed.. Notes Wound exam; the area and concern is on the right buttock. Pale erythema.. There is no tenderness. This does blanch. There does not appear to be any obvious infection no tenderness. His original wound was then an inverted V I think mostly on the left buttock although there was pressure area on the right as well. I think the current issue is a combination of pressure friction and scar tissue. I don't think there is an ongoing Doctor, hospital) Signed: 04/15/2016 5:07:10 PM By: Linton Ham MD Entered By: Linton Ham on 04/15/2016 16:58:26 [name], [name] ([id number]) -------------------------------------------------------------------------------- Physician Orders Details Patient Name: [name] Date of Service: 04/15/2016 9:30 AM Medical Record Patient Account Number: [id number] Number: Treating RN: [name] [birth year] (65 y.o. Other Clinician: Date of Birth/Sex: Male) Treating [name] Primary Care Physician: Garret Reddish Physician/Extender: G Referring Physician: Kaylyn Layer in Treatment: 0 Verbal / Phone Orders: Yes Clinician: Carolyne Fiscal, Debi Read Back and Verified: Yes Diagnosis Coding Discharge From [facility] o Discharge from [place] pressure off the sacral area as much as possible. Keep clean and dry. Please contact our office if you have any questions or concerns. Electronic Signature(s) Signed: 04/15/2016 4:44:26 PM By: Alric Quan Signed: 04/15/2016 5:07:10 PM By: Linton Ham MD Entered By: Alric Quan on 04/15/2016 09:49:05 [name], [name] ([id number]) -------------------------------------------------------------------------------- Problem List Details Patient Name: [name], COLATO. Date of Service: 04/15/2016 9:30 AM Medical Record Patient Account Number: [id number] Number: Treating RN: [name] 12/12/1950 (65 y.o. Other Clinician: Date of Birth/Sex: Male) Treating [name] Primary Care Physician: Garret Reddish Physician/Extender: G Referring Physician: Garret Reddish Weeks in Treatment: 0 Active Problems ICD-10 Encounter Code Description Active Date Diagnosis L89.311 Pressure ulcer of right buttock, stage 1 04/15/2016 Yes Inactive Problems Resolved Problems Electronic Signature(s) Signed: 04/15/2016 5:07:10 PM By: Linton Ham MD Entered By: Linton Ham on 04/15/2016 17:01:33 [name], [name] ([id number]) -------------------------------------------------------------------------------- Progress Note Details Patient Name: [name] Date of Service: 04/15/2016 9:30 AM Medical Record Patient Account Number: [id number] Number: Treating RN: [name] [birth year] (65 y.o. Other Clinician: Date of Birth/Sex: Male) Treating [name] Primary Care Physician: Garret Reddish Physician/Extender: G Referring Physician: Garret Reddish Weeks in Treatment: 0 Subjective Chief Complaint Information obtained from Patient 02/12/16; patient is here for review of her pressure ulcer on his buttock 04/15/16; patient is here for review of his previous pressure ulcer area on the left buttock although there is no open wound today. History of Present Illness (HPI) 02/12/16; this is a patient who was admitted to [facility] in late September and underwent esophagectomy for esophageal cancer on September 29. Several days into this stay in  the hospital he started to experience pain in his Buttock. Eventually he was able to get the attention of the wound nurse who thought this was a friction injury was given barrier cream. By the time he was discharged from hospital and got home he continued to complain of pain to his wife and a noted significant injury in the area. He was seen by his primary doctor about 4 noted a stage II pressure ulcer did some debridement and prescribed Keflex. He is not  doing any specific dressings topically to the wound and he is referred here for review of this. The patient has a gastric sleeve and is already on a liquid diet. He receives feeding through his jejunostomy tube at night to supplement this. From this point of view he appears to be doing well 02/20/16; the patient continues to improve his diet has been augmented. Pressure areas have also improved on his gluteal 02/27/16: The patient now has no open area. Her is still some irritation especially on the right gluteal fold however this is better than last time. He still states that when he leans back on his coccyx this hurts 04/15/16; this is a patient I cared for for several weeks ending about 6 weeks ago. At that point he had a pressure ulcer obtained while he was undergoing an esophagectomy for esophageal cancer at [facility]. We're able to get this to close over. He is here out of several concerns accompanied by his wife. Firstly he has episodic complaints of pain in the area which she is a shooting quality pain not sustained. It does not keep him awake at night. He also has concerns about some degree of continued erythema. Finally he is going for a repeat repair of a large ventral abdominal hernia and they're wondering if there are any concerns relative to this Wound History Patient presents with 1 open wound that has been present for approximately 1 month. Patient has been treating wound in the following manner: nothing keeping  pressure off. Laboratory tests have not been performed in the last month. Patient reportedly has not tested positive for an antibiotic resistant organism. Patient reportedly has not tested positive for osteomyelitis. Patient reportedly has not had testing performed [name], [name]. ([id number]) to evaluate circulation in the legs. Patient History Information obtained from Patient. Allergies fentanyl, levofloxacin, latex, animal dander Family History Cancer - Maternal Grandparents, Paternal Grandparents, Diabetes - Father, Paternal Grandparents, Siblings, Heart Disease - Maternal Grandparents, Hypertension - Father, Stroke - Father, , Mother, Thyroid Problems - Maternal Grandparents, No family history of Hereditary Spherocytosis, Kidney Disease, Lung Disease, Seizures, Tuberculosis. Social History Never smoker, Marital Status - Married, Alcohol Use - Rarely, Drug Use - No History, Caffeine Use - Never. Medical History Integumentary (Skin) Patient has history of History of pressure wounds Review of Systems (ROS) Constitutional Symptoms (General Health) The patient has no complaints or symptoms. Eyes Complains or has symptoms of Glasses / Contacts. Ear/Nose/Mouth/Throat The patient has no complaints or symptoms. Hematologic/Lymphatic The patient has no complaints or symptoms. Respiratory The patient has no complaints or symptoms. Gastrointestinal The patient has no complaints or symptoms. Endocrine The patient has no complaints or symptoms. Genitourinary The patient has no complaints or symptoms. Immunological The patient has no complaints or symptoms. Neurologic The patient has no complaints or symptoms. Psychiatric The patient has no complaints or symptoms. Objective [name], [name] ([id number]) Constitutional Patient is hypertensive.. Pulse regular and within target range for patient.Marland Kitchen Respirations regular, non-labored and within target range.. Temperature is normal and  within the target range for the patient.. Patient's appearance is neat and clean. Appears in no acute distress. Well nourished and well developed.. Vitals Time Taken: 9:24 AM, Height: 73 in, Source: Stated, Weight: 190 lbs, Source: Measured, BMI: 25.1, Temperature: 97.5 F, Pulse: 67 bpm, Respiratory Rate: 18 breaths/min, Blood Pressure: 147/75 mmHg. General Notes: Wound exam; the area and concern is on the right buttock. Pale erythema.. There is no tenderness. This does blanch. There does not appear  to be any obvious infection no tenderness. His original wound was then an inverted V I think mostly on the left buttock although there was pressure area on the right as well. I think the current issue is a combination of pressure friction and scar tissue. I don't think there is an ongoing issue Assessment Active Problems ICD-10 L89.311 - Pressure ulcer of right buttock, stage 1 Plan [facility]charge From [facility]: Discharge from [place] pressure off the sacral area as much as possible. Keep clean and dry. Please contact our office if you have any questions or concerns. #1 skin around his buttock and his coccyx is very dry I've recommended moisturizing skin cream #2 at this point I don't think there is any ongoing specific issue that I'm concerned about. Certainly no evidence of an infection. [name], [name] ([id number]) #3 the patient admits to having a lot of difficulty sitting up straight and tends to sit back on this area which is obviously part of the problem. #4 at this point I don't think we need to follow the patient here he has no open area #5 he is going for really fairly substantial abdominal hernia repair in January. This area will need attention and I've told him to speak to the intake personnel at [facility]. Electronic Signature(s) Signed: 04/16/2016 11:48:52 AM By: Gretta Cool RN, BSN, Kim RN, BSN Signed: 04/16/2016 5:40:41 PM By: Linton Ham MD Previous Signature:  04/15/2016 5:07:10 PM Version By: Linton Ham MD Entered By: Gretta Cool RN, BSN, Kim on 04/16/2016 11:48:52 [name], [name] ([id number]) -------------------------------------------------------------------------------- ROS/PFSH Details Patient Name: AZAYAH, BRAGG. Date of Service: 04/15/2016 9:30 AM Medical Record Patient Account Number: [id number] Number: Treating RN: [name] [birth year] (65 y.o. Other Clinician: Date of Birth/Sex: Male) Treating [name], [place] Primary Care Physician: Garret Reddish Physician/Extender: G Referring Physician: Garret Reddish Weeks in Treatment: 0 Information Obtained From Patient Wound History Do you currently have one or more open woundso Yes How many open wounds do you currently haveo 1 Approximately how long have you had your woundso 1 month How have you been treating your wound(s) until nowo nothing keeping pressure off Has your wound(s) ever healed and then re-openedo No Have you had any lab work done in the past montho No Have you tested positive for an antibiotic resistant organism (MRSA, No VRE)o Have you tested positive for osteomyelitis (bone infection)o No Have you had any tests for circulation on your legso No Eyes Complaints and Symptoms: Positive for: Glasses / Contacts Constitutional Symptoms (General Health) Complaints and Symptoms: No Complaints or Symptoms Ear/Nose/Mouth/Throat Complaints and Symptoms: No Complaints or Symptoms Hematologic/Lymphatic Complaints and Symptoms: No Complaints or Symptoms Respiratory Complaints and Symptoms: No Complaints or Symptoms Devereux, Morey Lenna Sciara ([id number]) Cardiovascular Medical History: Positive for: Hypertension Gastrointestinal Complaints and Symptoms: No Complaints or Symptoms Endocrine Complaints and Symptoms: No Complaints or Symptoms Genitourinary Complaints and Symptoms: No Complaints or Symptoms Immunological Complaints and Symptoms: No  Complaints or Symptoms Integumentary (Skin) Medical History: Positive for: History of pressure wounds Musculoskeletal Medical History: Positive for: Gout Neurologic Complaints and Symptoms: No Complaints or Symptoms Oncologic Medical History: Positive for: Received Chemotherapy - 2017; Received Radiation - 2017 Psychiatric Complaints and Symptoms: No Complaints or Symptoms Immunizations Pneumococcal Vaccine: Received Pneumococcal Vaccination: Yes SHIGEO, ROSEMANN ([id number]) Family and Social History Cancer: Yes - Maternal Grandparents, Paternal Grandparents; Diabetes: Yes - Father, Paternal Grandparents, Siblings; Heart Disease: Yes - Maternal Grandparents; Hereditary Spherocytosis: No; Hypertension: Yes - Father; Kidney Disease: No; Lung  Disease: No; Seizures: No; Stroke: Yes - Father, , Mother; Thyroid Problems: Yes - Maternal Grandparents; Tuberculosis: No; Never smoker; Marital Status - Married; Alcohol Use: Rarely; Drug Use: No History; Caffeine Use: Never; Financial Concerns: No; Food, Clothing or Shelter Needs: No; Support System Lacking: No; Transportation Concerns: No; Advanced Directives: No; Patient does not want information on Advanced Directives; Do not resuscitate: No; Living Will: Yes (Not Provided); Medical Power of Attorney: Yes - Lamarr Lulas (Not Provided) Electronic Signature(s) Signed: 04/15/2016 4:44:26 PM By: Alric Quan Signed: 04/15/2016 5:07:10 PM By: Linton Ham MD Entered By: Alric Quan on 04/15/2016 09:28:06 [name] ([id number]) -------------------------------------------------------------------------------- [place] Details Patient Name: [name] Date of Service: 04/15/2016 Medical Record Patient Account Number: [id number] Number: Treating RN: [name] Jul 23, 1950 (65 y.o. Other Clinician: Date of Birth/Sex: Male) Treating [name], [place] Primary Care Physician: Garret Reddish Physician/Extender: G Referring Physician: Garret Reddish Weeks in Treatment: 0 Diagnosis Coding ICD-10 Codes Code Description [id number] Pressure ulcer of right buttock, stage 1 Facility Procedures CPT4 Code: [id number] Description: 99213 - WOUND CARE VISIT-LEV 3 EST PT Modifier: Quantity: 1 Physician Procedures CPT4 Code: [id number] Description: [id number] - WC PHYS LEVEL 2 - EST PT ICD-10 Description Diagnosis L89.311 Pressure ulcer of right buttock, stage 1 Modifier: Quantity: 1 Electronic Signature(s) Signed: 04/15/2016 5:07:10 PM By: Linton Ham MD Previous Signature: 04/15/2016 4:44:26 PM Version By: Alric Quan Entered By: Linton Ham on 04/15/2016 17:02:13

## 2016-04-16 NOTE — Progress Notes (Signed)
CES[facility], [name] Visit Report for 04/15/2016 Abuse/Suicide Risk Screen Details Patient Name: [name], [name]. Date of Service: 04/15/2016 9:30 AM Medical Record Patient Account Number: [id number] Number: Treating RN: [name] Mar 05, 1951 (65 y.o. Other Clinician: Date of Birth/Sex: Male) Treating [name], [name] Primary Care Physician: Garret Reddish Physician/Extender: G Referring Physician: Garret Reddish Weeks in Treatment: 0 Abuse/Suicide Risk Screen Items Answer ABUSE/SUICIDE RISK SCREEN: Has anyone close to you tried to hurt or harm you recentlyo No Do you feel uncomfortable with anyone in your familyo No Has anyone forced you do things that you didnot want to doo No Do you have any thoughts of harming yourselfo No Patient displays signs or symptoms of abuse and/or neglect. No Electronic Signature(s) Signed: 04/15/2016 4:44:26 PM By: Alric Quan Entered By: Alric Quan on 04/15/2016 09:28:11 [name], [name] ([id number]) -------------------------------------------------------------------------------- Activities of Daily Living Details Patient Name: [name], [name]. Date of Service: 04/15/2016 9:30 AM Medical Record Patient Account Number: [id number] Number: Treating RN: [name] 11/11/1950 (65 y.o. Other Clinician: Date of Birth/Sex: Male) Treating [name], [name] Primary Care Physician: Garret Reddish Physician/Extender: G Referring Physician: Garret Reddish Weeks in Treatment: 0 Activities of Daily Living Items Answer Activities of Daily Living (Please select one for each item) Drive Automobile Completely Able Take Medications Completely Able Use Telephone Completely Able Care for Appearance Completely Able Use Toilet Completely Able Bath / Shower Completely Able Dress Self Completely Able Feed Self Completely Able Walk Completely Able Get In / Out Bed Completely Able Housework Completely  Able Prepare Meals Completely [place] for Self Completely Able Electronic Signature(s) Signed: 04/15/2016 4:44:26 PM By: Alric Quan Entered By: Alric Quan on 04/15/2016 [id number] [name] ([id number]) -------------------------------------------------------------------------------- Education Assessment Details Patient Name: [name]. Date of Service: 04/15/2016 9:30 AM Medical Record Patient Account Number: [id number] Number: Treating RN: [name] 07/05/1950 (65 y.o. Other Clinician: Date of Birth/Sex: Male) Treating [name], [name] Primary Care Physician: Garret Reddish Physician/Extender: G Referring Physician: Kaylyn Davidson in Treatment: 0 Primary Learner Assessed: Patient Learning Preferences/Education Level/Primary Language Learning Preference: Explanation, Printed Material Highest Education Level: College or Above Preferred Language: English Cognitive Barrier Assessment/Beliefs Language Barrier: No Translator Needed: No Memory Deficit: No Emotional Barrier: No Cultural/Religious Beliefs Affecting Medical No Care: Physical Barrier Assessment Impaired Vision: Yes Glasses Impaired Hearing: No Decreased Hand dexterity: No Knowledge/Comprehension Assessment Knowledge Level: High Comprehension Level: High Ability to understand written High instructions: Ability to understand verbal High instructions: Motivation Assessment Anxiety Level: Calm Cooperation: Cooperative Education Importance: Acknowledges Need Interest in Health Problems: Asks Questions Perception: Coherent Willingness to Engage in Self- High Management Activities: Readiness to Engage in Self- High Management Activities: [name], [name] ([id number]) Electronic Signature(s) Signed: 04/15/2016 4:44:26 PM By: Alric Quan Entered By: Alric Quan on 04/15/2016 09:28:48 [name], [name]  ([id number]) -------------------------------------------------------------------------------- Fall Risk Assessment Details Patient Name: [name] Date of Service: 04/15/2016 9:30 AM Medical Record Patient Account Number: [id number] Number: Treating RN: [name] [birth year] (65 y.o. Other Clinician: Date of Birth/Sex: Male) Treating [name], [name] Primary Care Physician: Garret Reddish Physician/Extender: G Referring Physician: Garret Reddish Weeks in Treatment: 0 Fall Risk Assessment Items Have you had 2 or more falls in the last 12 monthso 0 No Have you had any fall that resulted in injury in the last 12 monthso 0 No FALL RISK ASSESSMENT: History of falling - immediate or within 3 months 0 No Secondary diagnosis 0 No Ambulatory aid None/bed rest/wheelchair/nurse  0 No Crutches/cane/walker 0 No Furniture 0 No IV Access/Saline Lock 0 No Gait/Training Normal/bed rest/immobile 0 No Weak 0 No Impaired 0 No Mental Status Oriented to own ability 0 Yes Electronic Signature(s) Signed: 04/15/2016 4:44:26 PM By: Alric Quan Entered By: Alric Quan on 04/15/2016 09:29:53 [name], [name] ([id number]) -------------------------------------------------------------------------------- Foot Assessment Details Patient Name: [name]. Date of Service: 04/15/2016 9:30 AM Medical Record Patient Account Number: [id number] Number: Treating RN: [name] 05/17/1950 (65 y.o. Other Clinician: Date of Birth/Sex: Male) Treating [name], [name] Primary Care Physician: Garret Reddish Physician/Extender: G Referring Physician: Garret Reddish Weeks in Treatment: 0 Foot Assessment Items Site Locations + = Sensation present, - = Sensation absent, C = Callus, U = Ulcer R = Redness, W = Warmth, M = Maceration, PU = Pre-ulcerative lesion F = Fissure, S = Swelling, D = Dryness Assessment Right: Left: Other Deformity: No No Prior Foot  Ulcer: No No Prior Amputation: No No Charcot Joint: No No Ambulatory Status: Gait: Electronic Signature(s) Signed: 04/15/2016 4:44:26 PM By: Alric Quan Entered By: Alric Quan on 04/15/2016 09:30:17 [name], [name] ([id number]) -------------------------------------------------------------------------------- Nutrition Risk Assessment Details Patient Name: [name]. Date of Service: 04/15/2016 9:30 AM Medical Record Patient Account Number: [id number] Number: Treating RN: [name] [birth year] (65 y.o. Other Clinician: Date of Birth/Sex: Male) Treating [name], [name] Primary Care Physician: Garret Reddish Physician/Extender: G Referring Physician: Garret Reddish Weeks in Treatment: 0 Height (in): 73 Weight (lbs): 190 Body Mass Index (BMI): 25.1 Nutrition Risk Assessment Items NUTRITION RISK SCREEN: I have an illness or condition that made me change the kind and/or 2 Yes amount of food I eat I eat fewer than two meals per day 3 Yes I eat few fruits and vegetables, or milk products 0 No I have three or more drinks of beer, liquor or wine almost every day 0 No I have tooth or mouth problems that make it hard for me to eat 0 No I don't always have enough money to buy the food I need 0 No I eat alone most of the time 0 No I take three or more different prescribed or over-the-counter drugs a 1 Yes day Without wanting to, I have lost or gained 10 pounds in the last six 2 Yes months I am not always physically able to shop, cook and/or feed myself 0 No Nutrition Protocols Good Risk Protocol Moderate Risk Protocol Electronic Signature(s) Signed: 04/15/2016 4:44:26 PM By: Alric Quan Entered By: Alric Quan on 04/15/2016 09:30:08

## 2016-05-29 DIAGNOSIS — Z7189 Other specified counseling: Secondary | ICD-10-CM | POA: Diagnosis not present

## 2016-05-29 DIAGNOSIS — Z8501 Personal history of malignant neoplasm of esophagus: Secondary | ICD-10-CM | POA: Diagnosis not present

## 2016-05-29 DIAGNOSIS — K439 Ventral hernia without obstruction or gangrene: Secondary | ICD-10-CM | POA: Diagnosis not present

## 2016-05-29 DIAGNOSIS — J90 Pleural effusion, not elsewhere classified: Secondary | ICD-10-CM | POA: Diagnosis not present

## 2016-05-29 DIAGNOSIS — Z9889 Other specified postprocedural states: Secondary | ICD-10-CM | POA: Diagnosis not present

## 2016-05-29 DIAGNOSIS — Z08 Encounter for follow-up examination after completed treatment for malignant neoplasm: Secondary | ICD-10-CM | POA: Diagnosis not present

## 2016-05-29 DIAGNOSIS — C155 Malignant neoplasm of lower third of esophagus: Secondary | ICD-10-CM | POA: Diagnosis not present

## 2016-05-29 DIAGNOSIS — C159 Malignant neoplasm of esophagus, unspecified: Secondary | ICD-10-CM | POA: Diagnosis not present

## 2016-05-29 DIAGNOSIS — J9811 Atelectasis: Secondary | ICD-10-CM | POA: Diagnosis not present

## 2016-06-05 ENCOUNTER — Ambulatory Visit (INDEPENDENT_AMBULATORY_CARE_PROVIDER_SITE_OTHER): Payer: Medicare Other | Admitting: Family Medicine

## 2016-06-05 ENCOUNTER — Telehealth: Payer: Self-pay | Admitting: Family Medicine

## 2016-06-05 ENCOUNTER — Encounter: Payer: Self-pay | Admitting: Family Medicine

## 2016-06-05 VITALS — BP 132/82 | HR 68 | Temp 98.8°F | Ht 73.0 in | Wt 187.4 lb

## 2016-06-05 DIAGNOSIS — C159 Malignant neoplasm of esophagus, unspecified: Secondary | ICD-10-CM

## 2016-06-05 DIAGNOSIS — I10 Essential (primary) hypertension: Secondary | ICD-10-CM

## 2016-06-05 NOTE — Patient Instructions (Signed)
Start back on half a losartan 100mg  pill a day (so 50mg  total)   Recheck 1 month  Continue to monitor at home and can bring a record with you  We can recheck home cuff if you want as well next time

## 2016-06-05 NOTE — Assessment & Plan Note (Signed)
Most recent scans reassuring. Has been very stressful on patient- gave handout for [place] [facility] specialists and encouraged to follow up. No SI.

## 2016-06-05 NOTE — Telephone Encounter (Signed)
Pt is scheduled with Dr Yong Channel this afternoon. Nothing further needed at this time.

## 2016-06-05 NOTE — Telephone Encounter (Signed)
PLEASE NOTE: All timestamps contained within this report are represented as [place] Standard Time. CONFIDENTIALTY NOTICE: This fax transmission is intended only for the addressee. It contains information that is legally privileged, confidential or otherwise protected from use or disclosure. If you are not the intended recipient, you are strictly prohibited from reviewing, disclosing, copying using or disseminating any of this information or taking any action in reliance on or regarding this information. If you have received this fax in error, please notify us immediately by telephone so that we can arrange for its return to [place]. Phone: [phone], Toll-Free: [phone], Fax: [phone] Page: 1 of 1 Call Id: [id number] [facility] [place] Patient Name: [name] DOB: May 03, 1950 Initial Comment Caller states, he has been off of his bp rx. for many months. He was to go back on the bp rx. His bp 140-150 Verified - mild pain in head. Verified Nurse Assessment Nurse: Marcelline Deist, RN, Lynda Date/Time (Eastern Time): 06/05/2016 11:45:33 AM Confirm and document reason for call. If symptomatic, describe symptoms. ---Caller states he has been off of his BP rx. for many months 3-4 months. He was to go back on the BP rx. - Losartan. Had been through cancer & chemo. His BP is ranging 140-150/99 Verified - Has been getting very mild pain in head at times. Verified Does the patient have any new or worsening symptoms? ---Yes Will a triage be completed? ---Yes Related visit to physician within the last 2 weeks? ---No Does the PT have any chronic conditions? (i.e. diabetes, asthma, etc.) ---Yes List chronic conditions. ---esophagus cancer, BP rx Is this a behavioral health or substance abuse call? ---No Guidelines Guideline Title Affirmed Question Affirmed Notes High Blood Pressure [1] BP # 140/90 AND [2] not taking  BP medications Final Disposition User See PCP within 2 Baxter Kail, RN, Kermit Balo Comments Caller states he has appt. today at 3 pm. was taking Losartan 100 mg, lost a bunch of weight prior to cancer rxs, then cut back to 50 mg. Has been off of it for several months now. Wondering if his cuff is accurate. Disagree/Comply: Comply

## 2016-06-05 NOTE — Progress Notes (Signed)
Pre visit review using our clinic review tool, if applicable. No additional management support is needed unless otherwise documented below in the visit note. 

## 2016-06-05 NOTE — Assessment & Plan Note (Signed)
S: poorly controlled on home readings. Patient has it checked at [facility] multiple times and always in 140s and at times even in the 150s.  BP Readings from Last 3 Encounters:  06/05/16 132/82. Home cuff 140/90 Repeat 138/88. Home 143/93.   02/08/16 128/74  09/27/15 110/82  A/P: we had an extended discussion about newer guidelines vs. JNC8 and discussion of preference for jnc8 at present- with that said- concern that he is averaging over 140 at home and outside office. We opted to restart losartan 100mg  tablets- but only half tablet. Continue home monitoring and follow up 1 month

## 2016-06-05 NOTE — Progress Notes (Signed)
Subjective:  AROLDO ROKUSEK is a 66 y.o. year old very pleasant male patient who presents for/with See problem oriented charting ROS- No chest pain or shortness of breath. No headache or blurry vision.   Past Medical History-  Patient Active Problem List   Diagnosis Date Noted  . Esophageal cancer ([place]) 09/18/2015    Priority: High  . Prostate cancer ([place]) 06/18/2011    Priority: High  . Gout 12/27/2013    Priority: Medium  . Essential hypertension, benign 09/11/2012    Priority: Medium  . Hyperlipidemia 12/30/2006    Priority: Medium  . Left groin pain 12/24/2012    Priority: Low  . ANKLE PAIN, CHRONIC 05/01/2009    Priority: Low  . Blood in stool 10/11/2008    Priority: Low  . osteopenia right femur 08/15/2008    Priority: Low  . VARICOSE VEINS LOWER EXTREMITIES W/INFLAMMATION 11/01/2007    Priority: Low  . COLONIC POLYPS, HX OF 12/30/2006    Priority: Low    Medications- reviewed and updated Current Outpatient Prescriptions  Medication Sig Dispense Refill  . aspirin 81 MG tablet Take 81 mg by mouth daily. Reported on 09/27/2015     No current facility-administered medications for this visit.     Objective: BP 132/82 (BP Location: Left Arm, Patient Position: Sitting, Cuff Size: Large)   Pulse 68   Temp 98.8 F (37.1 C) (Oral)   Ht 6\' 1"  (1.854 m)   Wt 187 lb 6.4 oz (85 kg)   SpO2 95%   BMI 24.72 kg/m  Gen: NAD, resting comfortably CV: RRR no murmurs rubs or gallops Lungs: CTAB no crackles, wheeze, rhonchi  Ext: no edema Skin: warm, dry  Assessment/Plan:  Essential hypertension, benign S: poorly controlled on home readings. Patient has it checked at [facility] multiple times and always in 140s and at times even in the 150s.  BP Readings from Last 3 Encounters:  06/05/16 132/82. Home cuff 140/90 Repeat 138/88. Home 143/93.   02/08/16 128/74  09/27/15 110/82  A/P: we had an extended discussion about newer guidelines vs. JNC8 and discussion of preference for  jnc8 at present- with that said- concern that he is averaging over 140 at home and outside office. We opted to restart losartan 100mg  tablets- but only half tablet. Continue home monitoring and follow up 1 month  Esophageal cancer ([place]) Most recent scans reassuring. Has been very stressful on patient- gave handout for [place] behavioral health specialists and encouraged to follow up. No SI.   1 month. Will need to add losartan back to med list through ordering at follow up.   The duration of face-to-face time during this visit was greater than 25 minutes. Greater than 50% of this time was spent in counseling, explanation of diagnosis, planning of further management, and/or coordination of care including specifically jnc8 vs new guidelines discussion as well as counseling for stress of recent cancer treatment/workup/stress.    Return precautions advised.  Garret Reddish, MD

## 2016-06-17 DIAGNOSIS — H2513 Age-related nuclear cataract, bilateral: Secondary | ICD-10-CM | POA: Diagnosis not present

## 2016-06-17 DIAGNOSIS — H40013 Open angle with borderline findings, low risk, bilateral: Secondary | ICD-10-CM | POA: Diagnosis not present

## 2016-06-17 DIAGNOSIS — H01009 Unspecified blepharitis unspecified eye, unspecified eyelid: Secondary | ICD-10-CM | POA: Diagnosis not present

## 2016-06-17 DIAGNOSIS — H524 Presbyopia: Secondary | ICD-10-CM | POA: Diagnosis not present

## 2016-06-17 DIAGNOSIS — H5213 Myopia, bilateral: Secondary | ICD-10-CM | POA: Diagnosis not present

## 2016-06-17 DIAGNOSIS — H52203 Unspecified astigmatism, bilateral: Secondary | ICD-10-CM | POA: Diagnosis not present

## 2016-06-17 DIAGNOSIS — H35369 Drusen (degenerative) of macula, unspecified eye: Secondary | ICD-10-CM | POA: Diagnosis not present

## 2016-06-23 ENCOUNTER — Telehealth: Payer: Self-pay

## 2016-06-23 MED ORDER — LOSARTAN POTASSIUM 25 MG PO TABS: 25.0000 mg | ORAL_TABLET | Freq: Every day | ORAL | 5 refills | Status: DC

## 2016-06-23 NOTE — Telephone Encounter (Signed)
Spoke with pt and gave recommendations. He states that he will take a 3 day break from medication. He would like to call us on Friday and advise if his symptoms have improved or not. Rx sent to pharmacy. Nothing further needed at this time.

## 2016-06-23 NOTE — Telephone Encounter (Signed)
Spoke with [name] and he states that he has been taking 50mg  Losartan as directed at [facility], 06/05/16. He has noticed increased anxiety, weakness and fatigue. He states that he no longer has the energy to exercise at the gym, which is unusual for him. He is scheduled for ROV 07/03/16 and also is scheduled with psych 07/04/16. He would like to know if you have any recommendations as to the medication or if there might be something else he can take. His BP has been around 139/85.   Dr. Yong Channel - Please advise. Thanks!

## 2016-06-23 NOTE — Telephone Encounter (Signed)
Can you send in losartan 25mg  for patient? Lets have him take at least 3 days off medicine to see how he is feeling before restarting at lower dose. If has recurrence- to let us know

## 2016-06-26 ENCOUNTER — Ambulatory Visit (INDEPENDENT_AMBULATORY_CARE_PROVIDER_SITE_OTHER): Payer: Medicare Other | Admitting: Family Medicine

## 2016-06-26 ENCOUNTER — Encounter: Payer: Self-pay | Admitting: Family Medicine

## 2016-06-26 VITALS — BP 128/82 | HR 86 | Temp 97.9°F | Ht 73.0 in | Wt 187.6 lb

## 2016-06-26 DIAGNOSIS — R002 Palpitations: Secondary | ICD-10-CM | POA: Diagnosis not present

## 2016-06-26 DIAGNOSIS — R0602 Shortness of breath: Secondary | ICD-10-CM | POA: Diagnosis not present

## 2016-06-26 DIAGNOSIS — R5383 Other fatigue: Secondary | ICD-10-CM

## 2016-06-26 DIAGNOSIS — I10 Essential (primary) hypertension: Secondary | ICD-10-CM | POA: Diagnosis not present

## 2016-06-26 LAB — POCT INFLUENZA A/B
INFLUENZA A, POC: NEGATIVE
INFLUENZA B, POC: NEGATIVE

## 2016-06-26 NOTE — Assessment & Plan Note (Signed)
S: controlled on no losartan since Monday. Home #s mainly low 140s over low 90s but home cuff usually measures at least 5 higher than our readings systolic and diastolic.   BP Readings from Last 3 Encounters:  06/26/16 128/82  06/05/16 132/82  02/08/16 128/74  A/P: given he is feeling ill and BP appears controlled we will remain off losartan for now- including 25mg  recently sent in. In future consider 25 mg losartan if consistently above 145 or 150 over 95 on home readings (as home cuff appears about 5 points higher)

## 2016-06-26 NOTE — Progress Notes (Signed)
Pre visit review using our clinic review tool, if applicable. No additional management support is needed unless otherwise documented below in the visit note. 

## 2016-06-26 NOTE — Patient Instructions (Addendum)
Please stop by lab before you go  Please go to [facility] - located [address] across the [address] from [place] - in the basement - Hours: 8:30-5:30 PM M-F. Do not need appointment. -  EKG looked good. Flu test was negative. I wonder if this could be a viral illness making you feel poorly.   Possible follow up steps include echocardiogram (ultrasound) or cardiology referral. I would NOT go through surgery until this is further worked up

## 2016-06-26 NOTE — Progress Notes (Signed)
Subjective:  [name] is a 66 y.o. year old very pleasant male patient who presents for/with See problem oriented charting ROS- admits to shortness of breath and fatigue. No chest pain. No abdominal pain. some cough. No congestion.    Past Medical History-  Patient Active Problem List   Diagnosis Date Noted  . Esophageal cancer ([place]) 09/18/2015    Priority: High  . Prostate cancer ([place]) 06/18/2011    Priority: High  . Gout 12/27/2013    Priority: Medium  . Essential hypertension, benign 09/11/2012    Priority: Medium  . Hyperlipidemia 12/30/2006    Priority: Medium  . Left groin pain 12/24/2012    Priority: Low  . ANKLE PAIN, CHRONIC 05/01/2009    Priority: Low  . Blood in stool 10/11/2008    Priority: Low  . osteopenia right femur 08/15/2008    Priority: Low  . VARICOSE VEINS LOWER EXTREMITIES W/INFLAMMATION 11/01/2007    Priority: Low  . COLONIC POLYPS, HX OF 12/30/2006    Priority: Low    Medications- reviewed and updated Current Outpatient Prescriptions  Medication Sig Dispense Refill  . aspirin 81 MG tablet Take 81 mg by mouth daily. Reported on 09/27/2015    . losartan ([place]) 25 MG tablet Take 1 tablet (25 mg total) by mouth daily. 30 tablet 5   No current facility-administered medications for this visit.     Objective: BP 128/82 (BP Location: Left Arm, Patient Position: Sitting, Cuff Size: Large)   Pulse 86   Temp 97.9 F (36.6 C) (Oral)   Ht 6\' 1"  (1.854 m)   Wt 187 lb 9.6 oz (85.1 kg)   SpO2 92%   BMI 24.75 kg/m  Gen: NAD, appears somewhat faituged No JVD or hepatojugular reflux CV: RRR no murmurs rubs or gallops Lungs: CTAB no crackles, wheeze, rhonchi Abdomen: soft/nontender/nondistended/normal bowel sounds. No rebound or guarding.  Ext: no edema Skin: warm, dry Neuro: grossly normal, moves all extremities  EKG: Rate 78, sinus rhythm, normal intervals, no hypertrophy. Some movement particularly v2. We had difficulty with lead v3 falling  off. No significant st or t wave changes.   Assessment/Plan:  Palpitations - Plan: EKG 12-Lead Fatigue, unspecified type - Plan: POCT Influenza A/B Shortness of breath S:Shortness of breath has just started over last few days and has felt like his stamina has been down. Hands are codl. Feels some palpitations and can see it in his neck. Maybe mild body aches. Denies chest pain. Has some cough but this has been an issue since his esophageal cancer surgery.   Usually on elliptical can do about 30 minutes but today only was able to do 20 and felt like going slower.  A/P: 66 year old male who is high risk with prior esophageal cancer- surgery, radiation, chemo. Will get x-ray. Will get labs as listed below. Consider echocardiogram. Steps after that- would consider cardiology or stress testing. Emergent and urgent return precautions given. EKG reassuring. Flu swabbed just in case and negative. Possible bronchitis or viral illness. No signs of fluid overload but will get BNP and CXR.   We also discussed very real possibility of anxiety playing a role given his history of cancer and self admitted anxiety of something more significant going on even if he feels minor problems.   Cerumen impaction right S: ear felt full- tried q tip and worsened issue. Mild hearing loss O: TM obstructed on right side, normal on left. After irrigation: TM normal Right  A/P: unsuccessful irrigation today  despite 1.5 bottles used. Will trial mineral oil at home and may need ENT referral.    Orders Placed This Encounter  Procedures  . DG Chest 2 View    Standing Status:   Future    Standing Expiration Date:   08/26/2017    Order Specific Question:   Reason for Exam (SYMPTOM  OR DIAGNOSIS REQUIRED)    Answer:   shortness of breath several days, fatigue. rule out pneumonia. history esophageal cancer with surgery, chemo, radiation at [facility] Specific Question:   Preferred imaging location?    Answer:   [facility]  . CBC with Differential/Platelet  . Comprehensive metabolic panel    [place]  . Brain Natriuretic Peptide  . POCT Influenza A/B  . EKG 12-Lead   The duration of face-to-face time during this visit was greater than 40 minutes. Greater than 50% of this time was spent in counseling, explanation of diagnosis, planning of further management, and/or coordination of care including discussion of possible causes, discussion of anxiety role and thankful he is seeing Dr. Cheryln Manly next week, BP goal discussion, home care instructions for cerumen and follow up options.    Return precautions advised.  Garret Reddish, MD

## 2016-06-26 NOTE — Telephone Encounter (Signed)
Pt called to advise that he has not improved off Losartan. He reports that he has continued ShOB with exertion, fatigue, cold hands and a "strong" heartbeat. Pt denies chest/arm/jaw pain. Pt is able to carry on normal conversation over phone without ShOB. He would like to be seen and evaluated as he has pre-op scheduled this coming Tuesday at [facility] for hernia repair.   Dr. Yong Channel - Pt scheduled for Acute OV this afternoon. FYI. Thanks!

## 2016-06-27 ENCOUNTER — Ambulatory Visit (INDEPENDENT_AMBULATORY_CARE_PROVIDER_SITE_OTHER)
Admission: RE | Admit: 2016-06-27 | Discharge: 2016-06-27 | Disposition: A | Discharge status: Home / Self Care | Payer: Medicare Other | Source: Ambulatory Visit | Attending: Family Medicine | Admitting: Family Medicine

## 2016-06-27 ENCOUNTER — Telehealth: Payer: Self-pay | Admitting: Family Medicine

## 2016-06-27 DIAGNOSIS — R0602 Shortness of breath: Secondary | ICD-10-CM

## 2016-06-27 DIAGNOSIS — R05 Cough: Secondary | ICD-10-CM | POA: Diagnosis not present

## 2016-06-27 LAB — CBC WITH DIFFERENTIAL/PLATELET
Basophils Absolute: 0.1 10*3/uL (ref 0.0–0.1)
Basophils Relative: 1.6 % (ref 0.0–3.0)
EOS ABS: 0.4 10*3/uL (ref 0.0–0.7)
Eosinophils Relative: 5.9 % — ABNORMAL HIGH (ref 0.0–5.0)
HCT: 44.2 % (ref 39.0–52.0)
Hemoglobin: 15.1 g/dL (ref 13.0–17.0)
LYMPHS ABS: 0.9 10*3/uL (ref 0.7–4.0)
Lymphocytes Relative: 11.1 % — ABNORMAL LOW (ref 12.0–46.0)
MCHC: 34.2 g/dL (ref 30.0–36.0)
MCV: 86.7 fl (ref 78.0–100.0)
Monocytes Absolute: 0.8 10*3/uL (ref 0.1–1.0)
Monocytes Relative: 10.4 % (ref 3.0–12.0)
NEUTROS ABS: 5.4 10*3/uL (ref 1.4–7.7)
NEUTROS PCT: 71 % (ref 43.0–77.0)
PLATELETS: 222 10*3/uL (ref 150.0–400.0)
RBC: 5.1 Mil/uL (ref 4.22–5.81)
RDW: 15.9 % — ABNORMAL HIGH (ref 11.5–15.5)
WBC: 7.6 10*3/uL (ref 4.0–10.5)

## 2016-06-27 LAB — COMPREHENSIVE METABOLIC PANEL
ALT: 12 U/L (ref 0–53)
AST: 15 U/L (ref 0–37)
Albumin: 3.6 g/dL (ref 3.5–5.2)
Alkaline Phosphatase: 55 U/L (ref 39–117)
BILIRUBIN TOTAL: 0.5 mg/dL (ref 0.2–1.2)
BUN: 17 mg/dL (ref 6–23)
CO2: 30 mEq/L (ref 19–32)
CREATININE: 0.8 mg/dL (ref 0.40–1.50)
Calcium: 9.2 mg/dL (ref 8.4–10.5)
Chloride: 106 mEq/L (ref 96–112)
GFR: 102.44 mL/min (ref >60.00)
GLUCOSE: 100 mg/dL — AB (ref 70–99)
Potassium: 4.3 mEq/L (ref 3.5–5.1)
Sodium: 142 mEq/L (ref 135–145)
Total Protein: 6.1 g/dL (ref 6.0–8.3)

## 2016-06-27 LAB — BRAIN NATRIURETIC PEPTIDE: Pro B Natriuretic peptide (BNP): 34 pg/mL (ref 0.0–100.0)

## 2016-06-27 MED ORDER — AMOXICILLIN-POT CLAVULANATE 875-125 MG PO TABS: 1.0000 | ORAL_TABLET | Freq: Two times a day (BID) | ORAL | 0 refills | Status: DC

## 2016-06-27 MED ORDER — AZITHROMYCIN 250 MG PO TABS: ORAL_TABLET | ORAL | 0 refills | Status: DC

## 2016-06-27 NOTE — Telephone Encounter (Signed)
Given shortness of breath will treat for potential pneumonia with azithromycin and augmentin (leavquin allergy).   I also want patient to call his [facility] oncologist. He has an effusion bilaterally. Would want their opinion on scanning with CT or testing of fluid. They should be able to see x-ray on care everywhere.   Advised follow up if not improving next week. Or ER care over weekend if worsens.

## 2016-06-30 DIAGNOSIS — J189 Pneumonia, unspecified organism: Secondary | ICD-10-CM | POA: Diagnosis not present

## 2016-06-30 DIAGNOSIS — C155 Malignant neoplasm of lower third of esophagus: Secondary | ICD-10-CM | POA: Diagnosis not present

## 2016-07-03 ENCOUNTER — Ambulatory Visit (INDEPENDENT_AMBULATORY_CARE_PROVIDER_SITE_OTHER): Payer: Medicare Other | Admitting: Family Medicine

## 2016-07-03 ENCOUNTER — Encounter: Payer: Self-pay | Admitting: Family Medicine

## 2016-07-03 VITALS — BP 134/88 | HR 94 | Temp 98.1°F | Ht 73.0 in | Wt 187.8 lb

## 2016-07-03 DIAGNOSIS — J159 Unspecified bacterial pneumonia: Secondary | ICD-10-CM

## 2016-07-03 DIAGNOSIS — R0602 Shortness of breath: Secondary | ICD-10-CM | POA: Diagnosis not present

## 2016-07-03 MED ORDER — CLINDAMYCIN HCL 150 MG PO CAPS: 450.0000 mg | ORAL_CAPSULE | Freq: Three times a day (TID) | ORAL | 0 refills | Status: DC

## 2016-07-03 NOTE — Patient Instructions (Signed)
When you finish the augmentin- let's have you switch to clindamycin given only mild improvements  Recheck 1 week  If worsening symptoms such as shortness of breath, fever, dizziness, fatigue- please seek care immediately

## 2016-07-03 NOTE — Progress Notes (Signed)
Subjective:  [name] is a 66 y.o. year old very pleasant male patient who presents for/with See problem oriented charting ROS- no fever or chills. Some mild shortness of breath, feels fatigued. No chest pain. Does have some outer rib pain with coughin.    Past Medical History-  Patient Active Problem List   Diagnosis Date Noted  . Esophageal cancer ([place]) 09/18/2015    Priority: High  . Prostate cancer ([place]) 06/18/2011    Priority: High  . Gout 12/27/2013    Priority: Medium  . Essential hypertension, benign 09/11/2012    Priority: Medium  . Hyperlipidemia 12/30/2006    Priority: Medium  . Left groin pain 12/24/2012    Priority: Low  . ANKLE PAIN, CHRONIC 05/01/2009    Priority: Low  . Blood in stool 10/11/2008    Priority: Low  . osteopenia right femur 08/15/2008    Priority: Low  . VARICOSE VEINS LOWER EXTREMITIES W/INFLAMMATION 11/01/2007    Priority: Low  . COLONIC POLYPS, HX OF 12/30/2006    Priority: Low    Medications- reviewed and updated Current Outpatient Prescriptions  Medication Sig Dispense Refill  . amoxicillin-clavulanate (AUGMENTIN) 875-125 MG tablet Take 1 tablet by mouth 2 (two) times daily. 14 tablet 0  . aspirin 81 MG tablet Take 81 mg by mouth daily. Reported on 09/27/2015    . clindamycin (CLEOCIN) 150 MG capsule Take 3 capsules (450 mg total) by mouth 3 (three) times daily. 63 capsule 0   No current facility-administered medications for this visit.     Objective: BP 134/88 (BP Location: Left Arm, Patient Position: Sitting, Cuff Size: Large)   Pulse 94   Temp 98.1 F (36.7 C) (Oral)   Ht 6\' 1"  (1.854 m)   Wt 187 lb 12.8 oz (85.2 kg)   SpO2 96%   BMI 24.78 kg/m  Gen: NAD, resting comfortably, appears fatigued CV: RRR no murmurs rubs or gallops Lungs: CTAB no crackles, wheeze, rhonchi-  though perhaps mild decreased breath sounds at bases Abdomen: soft/nontender/nondistended/normal bowel sounds.   Ext: no edema Skin: warm, dry Psych:  appears somewhat down  Assessment/Plan:  Shortness of breath Pneumonia, bacterial S: Patient seen 7 days ago with palpitations, fatigue, SOB and some cough but not increased from baseline. HIstory esohageal cancer. Labs including BNP and CXR without fluid overload. Labs were largely reassuring. Flu test was negative. X-ray showed new small bilateral pelural effusions with bibalsilar atelectasis vs. Pneumonia. Given the SOB that was reported we opted to treat as pneumonia with augmentin and azihtomycin. Had patient call his [facility] team to look at x-ray in case they had concern about malignancy related to effusions- they thought treating as pneumonia was reasonable.   Since last visit patient states he has only had mild improvement in shortness of breath. He has noted his dry cough has turned productive . Sometimes even food content. Saw [facility] after seeing [place] and there was a ? Of aspiration. Wife continues to think there is an anxiety element at play . Patient is very concerned that he has developed pneumonia at all despite the pneumonia shots. Per records- he is looking for a [facility] PCP although he did not discuss this with me at the visit today. He seems very discouraged to be ill after prior esophageal cancer.  A/P:  SOB ? Due to pneumonia. Appears making mild improvements- oxygen level up to 96% from 92%, slightly less SOB. We opted as going into weekend to cover with 7 more day sbut  switch to clindamycin for potential aspiration element. He is also worrie dabout constipation on augmentin and clindamycin may have opposite effect. We will follow up 1 week from now. I am still tempted to get an echocardiogram or cardiology evaluation for new onset SOB. I also wonder about doing Ct scan of lungs if not improving and considering [facility] input on thoracentesis with effusion depending on how he is doing.   Meds ordered this encounter  Medications  . clindamycin (CLEOCIN) 150 MG capsule    Sig: Take 3 capsules (450  mg total) by mouth 3 (three) times daily.    Dispense:  63 capsule    Refill:  0   The duration of face-to-face time during this visit was greater than 25 minutes. Greater than 50% of this time was spent in counseling, explanation of diagnosis, planning of further management, and/or coordination of care including primarily counseling due to psychological stress of esophageal cancer and now being ill- patient very worried about something being more seriously wrong and also concerned about the process of how pneumonia occurs- extended counseling needed.   Return precautions advised.  Garret Reddish, MD

## 2016-07-03 NOTE — Progress Notes (Signed)
Pre visit review using our clinic review tool, if applicable. No additional management support is needed unless otherwise documented below in the visit note. 

## 2016-07-04 ENCOUNTER — Ambulatory Visit (INDEPENDENT_AMBULATORY_CARE_PROVIDER_SITE_OTHER): Payer: Medicare Other | Admitting: Psychology

## 2016-07-04 DIAGNOSIS — F4322 Adjustment disorder with anxiety: Secondary | ICD-10-CM | POA: Diagnosis not present

## 2016-07-10 ENCOUNTER — Telehealth: Payer: Self-pay | Admitting: Family Medicine

## 2016-07-10 ENCOUNTER — Inpatient Hospital Stay (HOSPITAL_COMMUNITY)
Admission: EM | Admit: 2016-07-10 | Discharge: 2016-07-11 | DRG: 193 | Disposition: A | Discharge status: Other Acute Inpatient Hospital | Payer: Medicare Other | Attending: Internal Medicine | Admitting: Internal Medicine

## 2016-07-10 ENCOUNTER — Encounter (HOSPITAL_COMMUNITY): Payer: Self-pay | Admitting: Emergency Medicine

## 2016-07-10 ENCOUNTER — Encounter: Payer: Self-pay | Admitting: Family Medicine

## 2016-07-10 ENCOUNTER — Emergency Department (HOSPITAL_COMMUNITY): Payer: Medicare Other

## 2016-07-10 ENCOUNTER — Ambulatory Visit (INDEPENDENT_AMBULATORY_CARE_PROVIDER_SITE_OTHER): Payer: Medicare Other | Admitting: Family Medicine

## 2016-07-10 VITALS — BP 106/78 | HR 102 | Temp 97.4°F | Ht 73.0 in | Wt 189.2 lb

## 2016-07-10 DIAGNOSIS — E669 Obesity, unspecified: Secondary | ICD-10-CM | POA: Diagnosis present

## 2016-07-10 DIAGNOSIS — J9600 Acute respiratory failure, unspecified whether with hypoxia or hypercapnia: Secondary | ICD-10-CM | POA: Diagnosis present

## 2016-07-10 DIAGNOSIS — J189 Pneumonia, unspecified organism: Secondary | ICD-10-CM | POA: Diagnosis present

## 2016-07-10 DIAGNOSIS — I10 Essential (primary) hypertension: Secondary | ICD-10-CM | POA: Diagnosis present

## 2016-07-10 DIAGNOSIS — E785 Hyperlipidemia, unspecified: Secondary | ICD-10-CM | POA: Diagnosis present

## 2016-07-10 DIAGNOSIS — Z9889 Other specified postprocedural states: Secondary | ICD-10-CM

## 2016-07-10 DIAGNOSIS — K9189 Other postprocedural complications and disorders of digestive system: Secondary | ICD-10-CM

## 2016-07-10 DIAGNOSIS — Z8546 Personal history of malignant neoplasm of prostate: Secondary | ICD-10-CM

## 2016-07-10 DIAGNOSIS — J918 Pleural effusion in other conditions classified elsewhere: Secondary | ICD-10-CM | POA: Diagnosis present

## 2016-07-10 DIAGNOSIS — J9601 Acute respiratory failure with hypoxia: Secondary | ICD-10-CM | POA: Diagnosis present

## 2016-07-10 DIAGNOSIS — R05 Cough: Secondary | ICD-10-CM | POA: Diagnosis not present

## 2016-07-10 DIAGNOSIS — J90 Pleural effusion, not elsewhere classified: Secondary | ICD-10-CM | POA: Diagnosis present

## 2016-07-10 DIAGNOSIS — C159 Malignant neoplasm of esophagus, unspecified: Secondary | ICD-10-CM | POA: Diagnosis not present

## 2016-07-10 DIAGNOSIS — R0602 Shortness of breath: Secondary | ICD-10-CM

## 2016-07-10 DIAGNOSIS — M109 Gout, unspecified: Secondary | ICD-10-CM | POA: Diagnosis present

## 2016-07-10 DIAGNOSIS — Z7982 Long term (current) use of aspirin: Secondary | ICD-10-CM

## 2016-07-10 DIAGNOSIS — N1330 Unspecified hydronephrosis: Secondary | ICD-10-CM

## 2016-07-10 DIAGNOSIS — J910 Malignant pleural effusion: Secondary | ICD-10-CM | POA: Diagnosis not present

## 2016-07-10 DIAGNOSIS — Z86718 Personal history of other venous thrombosis and embolism: Secondary | ICD-10-CM

## 2016-07-10 DIAGNOSIS — Z8501 Personal history of malignant neoplasm of esophagus: Secondary | ICD-10-CM | POA: Diagnosis not present

## 2016-07-10 DIAGNOSIS — C384 Malignant neoplasm of pleura: Secondary | ICD-10-CM | POA: Diagnosis not present

## 2016-07-10 DIAGNOSIS — R1110 Vomiting, unspecified: Secondary | ICD-10-CM

## 2016-07-10 LAB — CBC
HEMATOCRIT: 47.7 % (ref 39.0–52.0)
Hemoglobin: 16.4 g/dL (ref 13.0–17.0)
MCH: 29.8 pg (ref 26.0–34.0)
MCHC: 34.4 g/dL (ref 30.0–36.0)
MCV: 86.7 fL (ref 78.0–100.0)
PLATELETS: 239 10*3/uL (ref 150–400)
RBC: 5.5 MIL/uL (ref 4.22–5.81)
RDW: 14.5 % (ref 11.5–15.5)
WBC: 12 10*3/uL — AB (ref 4.0–10.5)

## 2016-07-10 LAB — COMPREHENSIVE METABOLIC PANEL
ALT: 18 U/L (ref 17–63)
AST: 25 U/L (ref 15–41)
Albumin: 3.6 g/dL (ref 3.5–5.0)
Alkaline Phosphatase: 59 U/L (ref 38–126)
Anion gap: 7 (ref 5–15)
BUN: 23 mg/dL — ABNORMAL HIGH (ref 6–20)
CO2: 32 mmol/L (ref 22–32)
CREATININE: 1.09 mg/dL (ref 0.61–1.24)
Calcium: 9.1 mg/dL (ref 8.9–10.3)
Chloride: 103 mmol/L (ref 101–111)
Glucose, Bld: 122 mg/dL — ABNORMAL HIGH (ref 65–99)
POTASSIUM: 4.3 mmol/L (ref 3.5–5.1)
Sodium: 142 mmol/L (ref 135–145)
TOTAL PROTEIN: 7.2 g/dL (ref 6.5–8.1)
Total Bilirubin: 0.7 mg/dL (ref 0.3–1.2)

## 2016-07-10 LAB — I-STAT TROPONIN, ED: TROPONIN I, POC: 0 ng/mL (ref 0.00–0.08)

## 2016-07-10 LAB — D-DIMER, QUANTITATIVE (NOT AT ARMC): D DIMER QUANT: 1.46 ug{FEU}/mL — AB (ref 0.00–0.50)

## 2016-07-10 LAB — BRAIN NATRIURETIC PEPTIDE: B Natriuretic Peptide: 35.6 pg/mL (ref 0.0–100.0)

## 2016-07-10 MED ORDER — IOPAMIDOL (ISOVUE-370) INJECTION 76%
100.0000 mL | Freq: Once | INTRAVENOUS | Status: AC | PRN
  Administered 2016-07-10: 100 mL via INTRAVENOUS

## 2016-07-10 MED ORDER — IOPAMIDOL (ISOVUE-370) INJECTION 76%
INTRAVENOUS | Status: AC
  Filled 2016-07-10: qty 100

## 2016-07-10 MED ORDER — ALBUTEROL SULFATE (2.5 MG/3ML) 0.083% IN NEBU
5.0000 mg | INHALATION_SOLUTION | Freq: Once | RESPIRATORY_TRACT | Status: AC
  Administered 2016-07-10: 5 mg via RESPIRATORY_TRACT
  Filled 2016-07-10: qty 6

## 2016-07-10 NOTE — ED Notes (Signed)
Pt completed breathing treatment.  87% on RA after treatment.  Placed pt back on 2L [place].  Pt states that his po antibiotic is due at this time.  Encouraged him to hold off on taking home med to give admitting MD the opportunity to adjust meds as he sees necessary

## 2016-07-10 NOTE — ED Notes (Signed)
Pt was taken to CT and he had to lay flat.  Pt came back from CT and began feeling very SOB.  Pt desat into mid 48s on 3L [place].  Pt appears flushed and states that he needs to sit up.  Pt begins to feels better.  Increased O2 to 2L [place].  Pt states that he becomes very sob if he has to lay down (had to lay down for CT).  Offered pt benadry which he declines (he states that he does not tolerate benadryl- I had concern about sob related to IVP die)

## 2016-07-10 NOTE — Telephone Encounter (Signed)
Pt seen in [place] today with Dr. Yong Channel.

## 2016-07-10 NOTE — Progress Notes (Signed)
Subjective:  [name] is a 66 y.o. year old very pleasant male patient who presents for/with See problem oriented charting ROS- no fever but in last 3 days has felt more fatigued and short of breath. Coughing up brownish sputum through yesterday though has not coughed at all today   Past Medical History-  Patient Active Problem List   Diagnosis Date Noted  . Esophageal cancer ([place]) 09/18/2015    Priority: High  . Prostate cancer ([place]) 06/18/2011    Priority: High  . Gout 12/27/2013    Priority: Medium  . Essential hypertension, benign 09/11/2012    Priority: Medium  . Hyperlipidemia 12/30/2006    Priority: Medium  . Left groin pain 12/24/2012    Priority: Low  . ANKLE PAIN, CHRONIC 05/01/2009    Priority: Low  . Blood in stool 10/11/2008    Priority: Low  . osteopenia right femur 08/15/2008    Priority: Low  . VARICOSE VEINS LOWER EXTREMITIES W/INFLAMMATION 11/01/2007    Priority: Low  . COLONIC POLYPS, HX OF 12/30/2006    Priority: Low    Medications- reviewed and updated Current Outpatient Prescriptions  Medication Sig Dispense Refill  . aspirin 81 MG tablet Take 81 mg by mouth daily. Reported on 09/27/2015    . clindamycin (CLEOCIN) 150 MG capsule Take 3 capsules (450 mg total) by mouth 3 (three) times daily. 63 capsule 0   No current facility-administered medications for this visit.     Objective: BP 106/78 (BP Location: Left Arm, Patient Position: Sitting, Cuff Size: Large)   Pulse (!) 102   Temp 97.4 F (36.3 C) (Oral)   Ht 6\' 1"  (1.854 m)   Wt 189 lb 3.2 oz (85.8 kg)   SpO2 90%   BMI 24.96 kg/m  Gen: NAD, resting comfortably CV: slightly tachycardic no murmurs rubs or gallops Lungs: labored breathing with supraclavicular retractions. Per wife has been breathing like this at least 2 days and today actually better than yesterday.  Apices clear but about mid lung field down with some decreased breath sounds Abdomen: soft/nontender/nondistended Ext:  trace edema L>R. At baseline left slightly larger than right with prior fracture. Skin: warm, dry, no rash over chest  Assessment/Plan:  Shortness of breath S: Patient presents today for 1 week follow up for shortness of breath thought to be due to bacterial pneumonia. Appeared to be improving last week on augmentin. Given possible aspiration concern we covered with an additional 7 days of clindamycin  He had been doing ok but then 2-3 days ago shortness of breath increased. Wife noted he seemed to be struggling to breath and he admits this as well today. No fever. Does feel more fatigued with activities. Coughing up brown sputum until today and cough has resolved A/P: SOB of unclear etiology. Thought ot be pneumonia related- treated with augmentin/azithromycin originally then with clindamycin in case aspiration related. Patient may have an increasing size effusion as his breath sounds now to about mid lung field are diminished. We Discussed his case with [facility] cardi[facility] surgery to see if they wanted him to be cared for at [facility] or locally and they suggested local admission with transfer if need be if ultimately related to prior esophageal malignancy. Have sent patient straight to [place]- given symptoms for 3 days- we thought reasonable to send by car instead of EMS.   The duration of face-to-face time during this visit was greater than 25 minutes. Greater than 50% of this time was spent in counseling,  explanation of diagnosis, planning of further management, and/or coordination of care including discussion of patient concerns about what this could mean- ? Malignancy or parapneumonic effusion? PE in differential but thought less likely with change in lung exam. Also had a discussion with [facility] cardi[facility]acic surgery.    Return precautions advised.  Garret Reddish, MD

## 2016-07-10 NOTE — ED Triage Notes (Signed)
Patient from PCP, dx with pneumonia x2 weeks ago, c/o increased SOB and productive cough x2 days. Denies chest pain.

## 2016-07-10 NOTE — ED Notes (Signed)
Bed: [facility]PT1 Expected date:  Expected time:  Means of arrival:  Comments: 

## 2016-07-10 NOTE — ED Notes (Signed)
Spoke with PA again as I am concerned about the delay getting hospitalist to see pt.  Pt respiratory status is not good and while his Vitals are stable pt has to sit up to maintain this and it is very tiring.  Will check with charge RN also

## 2016-07-10 NOTE — ED Provider Notes (Signed)
[facility] DEPT Provider Note   CSN: [id number] Arrival date & time: 07/10/16  1544     History   Chief Complaint Chief Complaint  Patient presents with  . Shortness of Breath    HPI [name] is a 66 y.o. male with PMHx of CKD, DVT, HTN, esophageal CA, recently diagnosed with pneumonia 2 weeks ago referred here by PCP with complaints of increased SOB x 3 days and productive cough x 2 days. He states it is a brown color last night and yellow before that. He denies chest pain. He denies cardiac history. He denies history of COPD, asthma, smoking. He denies previous history of similar symptoms. He states he has a history of esophageal cancer and had operation on it in September. He states he has been using his spirometer at home often.  He states he is very active and goes to gym. He denies any swelling of legs. He denies any hormone use. He denies recent travel outside the country. He states his PCP told him not to take his BP medication while he was taking clindamycin, so he has not been taking his BP for the last 2 weeks. He denies any anticoagulant use, does admit to daily ASA. He denies oxygen use at home.   The history is provided by the patient. No language interpreter was used.    Past Medical History:  Diagnosis Date  . Allergy   . Chronic kidney disease    kidney stone  . Diverticulosis   . DIVERTICULOSIS, COLON 12/30/2006   Qualifier: Diagnosis of  By: Rogue Bussing CMA, Maryann Alar    . DVT (deep venous thrombosis) ([place])    1990s  . Esophageal cancer ([place]) 09/18/2015  . Hemorrhoids   . Hernia, incisional   . Hx of adenomatous colonic polyps   . Hyperlipidemia   . Hypertension   . Obesity   . Prostate cancer ([place]) 06/18/11   adenocarcinoma,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,PSA=4.52  . Prostate cancer ([place])   . Rhinitis     Patient Active Problem List   Diagnosis Date Noted  . Bilateral pleural effusion 07/11/2016  . Acute respiratory failure with hypoxia ([place]) 07/11/2016  . Acute  respiratory failure ([place]) 07/10/2016  . Esophageal cancer ([place]) 09/18/2015  . Gout 12/27/2013  . Left groin pain 12/24/2012  . Essential hypertension, benign 09/11/2012  . Prostate cancer ([place]) 06/18/2011  . ANKLE PAIN, CHRONIC 05/01/2009  . Blood in stool 10/11/2008  . osteopenia right femur 08/15/2008  . VARICOSE VEINS LOWER EXTREMITIES W/INFLAMMATION 11/01/2007  . Hyperlipidemia 12/30/2006  . COLONIC POLYPS, HX OF 12/30/2006    Past Surgical History:  Procedure Laterality Date  . bilateral vein ligation     varicose veins   . COLONOSCOPY  09-2008  . ESOPHAGOGASTRODUODENOSCOPY (EGD) WITH PROPOFOL N/A 09/17/2015   Procedure: ESOPHAGOGASTRODUODENOSCOPY (EGD) WITH PROPOFOL;  Surgeon: Manya Silvas, MD;  Location: [facility] ENDOSCOPY;  Service: Endoscopy;  Laterality: N/A;  . EYE SURGERY     as child   . FRACTURE SURGERY  2009  . fx l tibia,fibula with external fixator  04-24-2008  . POLYPECTOMY  09-2008  . PROSTATECTOMY  2013   Open radical prostatecomy  . remove fixator    . varicocele repair     1990's       Home Medications    Prior to Admission medications   Medication Sig Start Date End Date Taking? Authorizing Provider  aspirin 81 MG tablet Take 81 mg by mouth daily. Reported on 09/27/2015   Yes Historical Provider,  MD  clindamycin (CLEOCIN) 150 MG capsule Take 3 capsules (450 mg total) by mouth 3 (three) times daily. 07/03/16  Yes Marin Olp, MD  Probiotic Product (ALIGN) 4 MG CAPS Take 4 mg by mouth daily.   Yes Historical Provider, MD    Family History Family History  Problem Relation Age of Onset  . Diabetes Father   . Colon polyps Father   . Stroke Father     28 former smoker  . Coronary artery disease Maternal Grandfather     67 never smoker  . Stroke Maternal Grandfather   . Cancer Paternal Grandmother     pancreatic, breast  . Transient ischemic attack Mother     14, 57  . Colon cancer Neg Hx   . Esophageal cancer Neg Hx   . Rectal cancer Neg Hx    . Stomach cancer Neg Hx     Social History Social History  Substance Use Topics  . Smoking status: Never Smoker  . Smokeless tobacco: Never Used  . Alcohol use 0.0 oz/week     Comment: occasional     Allergies   Adhesive [tape]; Fentanyl; Latex; Levofloxacin; and Other   Review of Systems Review of Systems  Constitutional: Negative for chills and fever.  Respiratory: Positive for cough and shortness of breath.   Cardiovascular: Negative for chest pain and leg swelling.  Gastrointestinal: Negative for abdominal pain, constipation, diarrhea, nausea and vomiting.  Genitourinary: Negative for difficulty urinating and dysuria.  All other systems reviewed and are negative.    Physical Exam Updated Vital Signs BP (!) 137/95   Pulse (!) 102   Temp 98.2 F (36.8 C) (Oral)   Resp 20   SpO2 97%   Physical Exam  Constitutional: He is oriented to person, place, and time. He appears well-developed and well-nourished.  Well appearing. On 4 L of O2.   HENT:  Head: Normocephalic and atraumatic.  Nose: Nose normal.  Mouth/Throat: Oropharynx is clear and moist.  Eyes: Conjunctivae and EOM are normal.  Neck: Normal range of motion.  Cardiovascular: Normal rate, normal heart sounds and intact distal pulses.   Pulmonary/Chest: Effort normal. No respiratory distress. He has rales.  On 4 L of O2. Normal work of breathing. Able to speak in full sentences and give history.  Abdominal: Soft. There is no tenderness. There is no rebound and no guarding.  Musculoskeletal: Normal range of motion.  Neurological: He is alert and oriented to person, place, and time.  Skin: Skin is warm. Capillary refill takes less than 2 seconds.  Psychiatric: He has a normal mood and affect. His behavior is normal.  Nursing note and vitals reviewed.    ED Treatments / Results  Labs (all labs ordered are listed, but only abnormal results are displayed) Labs Reviewed  COMPREHENSIVE METABOLIC PANEL -  Abnormal; Notable for the following:       Result Value   Glucose, Bld 122 (*)    BUN 23 (*)    All other components within normal limits  CBC - Abnormal; Notable for the following:    WBC 12.0 (*)    All other components within normal limits  D-DIMER, QUANTITATIVE (NOT AT [facility]) - Abnormal; Notable for the following:    D-Dimer, Quant 1.46 (*)    All other components within normal limits  BRAIN NATRIURETIC PEPTIDE  BASIC METABOLIC PANEL  CBC  PROTIME-INR  Randolm Idol, ED    EKG  EKG Interpretation  Date/Time:  Thursday July 10 2016  16:11:16 EDT Ventricular Rate:  104 PR Interval:    QRS Duration: 86 QT Interval:  363 QTC Calculation: 476 R Axis:   103 Text Interpretation:  Sinus tachycardia Right axis deviation Low voltage, extremity and precordial leads Borderline prolonged QT interval No significant change since last tracing Confirmed by Winfred Leeds  MD, SAM [phone]) on 07/10/2016 4:14:41 PM       Radiology Dg Chest 2 View  Result Date: 07/10/2016 CLINICAL DATA:  66 year old male being treated for root pneumonia for the prior 2 weeks with continued cough an shortness of breath. EXAM: CHEST  2 VIEW COMPARISON:  06/27/2016 and earlier. FINDINGS: Progressed bilateral pleural effusions now moderate to large. Extensive mid and lower lung compressive atelectasis, or less likely consolidation. Increased right peritracheal density may reflect a component of loculated effusion. Other visible mediastinal contours remain stable. No superimposed pneumothorax. No upper lobe pulmonary edema. Visualized tracheal air column is within normal limits. No acute osseous abnormality identified. Negative visible bowel gas pattern. IMPRESSION: 1. Progressed and now large bilateral pleural effusions since 06/27/2016. 2. New right peritracheal density might reflect a small loculated component of the right pleural fluid. Electronically Signed   By: Genevie Ann M.D.   On: 07/10/2016 16:30   Ct Angio Chest  Pe W/cm &/or Wo Cm  Result Date: 07/10/2016 CLINICAL DATA:  Shortness of breath. Diagnosed with pneumonia 2 weeks ago. Increasing shortness of breath and productive cough for 2 days. EXAM: CT ANGIOGRAPHY CHEST WITH CONTRAST TECHNIQUE: Multidetector CT imaging of the chest was performed using the standard protocol during bolus administration of intravenous contrast. Multiplanar CT image reconstructions and MIPs were obtained to evaluate the vascular anatomy. CONTRAST:  100 mL Isovue 370 COMPARISON:  09/18/2015 FINDINGS: Cardiovascular: Satisfactory opacification of the pulmonary arteries to the segmental level. No evidence of pulmonary embolism. Normal heart size. No pericardial effusion. Normal caliber thoracic aorta. No aortic dissection. Scattered aortic calcifications. Mediastinum/Nodes: Prominent dilatation of the esophagus which is filled with food and liquid. Surgical clips are present suggesting that this may represent postoperative changes due to esophageal resection with esophagogastrostomy. If the patient has not had this type of surgery, this would indicate achalasia or distal esophageal obstruction. No significant lymphadenopathy in the chest. Lungs/Pleura: Very large bilateral pleural effusions. Volume loss in the lung bases likely representing atelectasis. Consolidation not excluded. No pneumothorax. Upper Abdomen: Small amount of upper abdominal ascites. There is somewhat heterogeneous nodular infiltration suggested in the mesenteric. This is nonspecific but could indicate mesenteric tumor spread. This area is incompletely imaged within the field of view. Musculoskeletal: Mild degenerative changes in the spine. No destructive bone lesions. Review of the MIP images confirms the above findings. IMPRESSION: No evidence of significant pulmonary embolus. Very large bilateral pleural effusions with atelectasis or consolidation in the mid and lower lungs bilaterally. The esophagus is markedly distended and  filled with fluid and food. This could be due to postoperative esophagectomy with esophagogastrostomy but if the patient has not had this type of surgery, then this would indicate achalasia or distal esophageal obstruction. Free fluid in the upper abdomen may represent ascites. The visualized mesenteric a somewhat heterogeneous which could indicate peritoneal tumor spread. Electronically Signed   By: Lucienne Capers M.D.   On: 07/10/2016 22:08    Procedures Procedures (including critical care time)  Medications Ordered in ED Medications  acetaminophen (TYLENOL) tablet 650 mg (not administered)    Or  acetaminophen (TYLENOL) suppository 650 mg (not administered)  ondansetron (ZOFRAN) tablet 4 mg (  not administered)    Or  ondansetron (ZOFRAN) injection 4 mg (not administered)  piperacillin-tazobactam (ZOSYN) IVPB 3.375 g (not administered)  vancomycin (VANCOCIN) IVPB 1000 mg/200 mL premix (1,000 mg Intravenous Transfusing/Transfer 07/11/16 0113)  vancomycin (VANCOCIN) IVPB 1000 mg/200 mL premix (not administered)  piperacillin-tazobactam (ZOSYN) IVPB 3.375 g (not administered)  albuterol (PROVENTIL) (2.5 MG/3ML) 0.083% nebulizer solution 5 mg (5 mg Nebulization Given 07/10/16 1658)  iopamidol (ISOVUE-370) 76 % injection 100 mL (100 mLs Intravenous Contrast Given 07/10/16 2108)     Initial Impression / Assessment and Plan / ED Course  I have reviewed the triage vital signs and the nursing notes.  Pertinent labs & imaging results that were available during my care of the patient were reviewed by me and considered in my medical decision making (see chart for details).    Patient here with large increasing bilateral pleural effusions on x-ray and CT. Patient on 4 L of oxygen nasal cannula here. He does appear to have episodes of mild respiratory distress especially after activity or movement. Patient is afebrile, hemodynamically stable. Heart sounds are clear. Lungs have evidence of rales.  Abdomen soft and nontender. No peripheral edema seen at lower legs. He has no cardiac history. He does have a history of esophageal cancer and esophageal procedure done in September of last year. Lab work shows nonspecific leukocytosis. d-dimer is elevated this can be due to secondary to the pleural effusions. Negative troponin. EKG without acute findings. BNP within normal limits.  Other lab work is within normal limits. Patient also seen and evaluated by Dr. Zenia Resides  I spoke with Dr. Hal Hope who wanted to have CTA done before admission.  CTA ordered.  No evidence of PE on CT. CT did show known large pleural effusion.  I spoke with Dr. Hal Hope who agreed to admit patient.    Final Clinical Impressions(s) / ED Diagnoses   Final diagnoses:  Shortness of breath    New Prescriptions Current Discharge Medication List       [place], [place] 07/11/16 [phone]

## 2016-07-10 NOTE — ED Notes (Signed)
NT at the bedside speaking with pt and family

## 2016-07-10 NOTE — ED Provider Notes (Signed)
Medical screening examination/treatment/procedure(s) were conducted as a shared visit with non-physician practitioner(s) and myself.  I personally evaluated the patient during the encounter.   EKG Interpretation  Date/Time:  Thursday July 10 2016 16:11:16 EDT Ventricular Rate:  104 PR Interval:    QRS Duration: 86 QT Interval:  363 QTC Calculation: 476 R Axis:   103 Text Interpretation:  Sinus tachycardia Right axis deviation Low voltage, extremity and precordial leads Borderline prolonged QT interval No significant change since last tracing Confirmed by Winfred Leeds  MD, SAM [phone]) on 07/10/2016 4:14:16 PM     66 year old male presents from PCPs office complaining of increased shortness of breath and cough 2 days. Chest x-ray shows large bilateral pleural effusions. Patient is dyspneic on exam. Has decreased breath sounds bilaterally. Labs are pending at this time but patient be admitted for treatment of this.   Lacretia Leigh, MD 07/10/16 1739

## 2016-07-10 NOTE — Progress Notes (Signed)
Pre visit review using our clinic review tool, if applicable. No additional management support is needed unless otherwise documented below in the visit note. 

## 2016-07-10 NOTE — Telephone Encounter (Signed)
Patient Name: [name] DOB: 09/16/1950 Initial Comment Caller states when he lays down he is having trouble breathing, taking meds for pneumonia Nurse Assessment Nurse: Markus Daft, RN, Sherre Poot Date/Time (Eastern Time): 07/10/2016 11:15:04 AM Confirm and document reason for call. If symptomatic, describe symptoms. ---Caller states when he lays down he is having trouble breathing, taking antibiotic, Clindamycin 3 pills TID, for pneumonia. He's been on 3 total, the last one was March 2. Supposed to see MD today at 2:45 pm. He takes the antibiotic again at noon. He is not having SOB now. Denies CP. Does the patient have any new or worsening symptoms? ---Yes Will a triage be completed? ---Yes Related visit to physician within the last 2 weeks? ---Yes Does the PT have any chronic conditions? (i.e. diabetes, asthma, etc.) ---No Is this a behavioral health or substance abuse call? ---No Guidelines Guideline Title Affirmed Question Affirmed Notes Pneumonia on Antibiotic Post- Hospitalization Follow-up Call [1] MILD difficulty breathing (e.g., minimal/no SOB at rest, SOB with walking, pulse <100) AND [2] worse than when discharged from hospital Final Disposition User See Physician within [place], [place], [place] Comments RN confirmed that SOB is not r/t Clindamycin (per [facility]). Caller verb. understanding. Referrals REFERRED TO PCP OFFICE Disagree/Comply: Comply

## 2016-07-11 ENCOUNTER — Encounter (HOSPITAL_COMMUNITY): Payer: Self-pay | Admitting: Internal Medicine

## 2016-07-11 ENCOUNTER — Inpatient Hospital Stay (HOSPITAL_COMMUNITY): Payer: Medicare Other

## 2016-07-11 DIAGNOSIS — J9601 Acute respiratory failure with hypoxia: Secondary | ICD-10-CM

## 2016-07-11 DIAGNOSIS — J90 Pleural effusion, not elsewhere classified: Secondary | ICD-10-CM | POA: Diagnosis present

## 2016-07-11 DIAGNOSIS — J9600 Acute respiratory failure, unspecified whether with hypoxia or hypercapnia: Secondary | ICD-10-CM

## 2016-07-11 DIAGNOSIS — C159 Malignant neoplasm of esophagus, unspecified: Secondary | ICD-10-CM

## 2016-07-11 LAB — BASIC METABOLIC PANEL
ANION GAP: 7 (ref 5–15)
BUN: 21 mg/dL — AB (ref 6–20)
CO2: 29 mmol/L (ref 22–32)
Calcium: 8.7 mg/dL — ABNORMAL LOW (ref 8.9–10.3)
Chloride: 105 mmol/L (ref 101–111)
Creatinine, Ser: 0.85 mg/dL (ref 0.61–1.24)
Glucose, Bld: 130 mg/dL — ABNORMAL HIGH (ref 65–99)
POTASSIUM: 4 mmol/L (ref 3.5–5.1)
SODIUM: 141 mmol/L (ref 135–145)

## 2016-07-11 LAB — MRSA PCR SCREENING: MRSA by PCR: NEGATIVE

## 2016-07-11 LAB — CBC
HEMATOCRIT: 47.9 % (ref 39.0–52.0)
HEMOGLOBIN: 16.1 g/dL (ref 13.0–17.0)
MCH: 29.1 pg (ref 26.0–34.0)
MCHC: 33.6 g/dL (ref 30.0–36.0)
MCV: 86.6 fL (ref 78.0–100.0)
Platelets: 236 10*3/uL (ref 150–400)
RBC: 5.53 MIL/uL (ref 4.22–5.81)
RDW: 14.5 % (ref 11.5–15.5)
WBC: 11.6 10*3/uL — ABNORMAL HIGH (ref 4.0–10.5)

## 2016-07-11 LAB — BODY FLUID CELL COUNT WITH DIFFERENTIAL
Eos, Fluid: 0 %
Lymphs, Fluid: 30 %
MONOCYTE-MACROPHAGE-SEROUS FLUID: 54 % (ref 50–90)
Neutrophil Count, Fluid: 16 % (ref 0–25)
Total Nucleated Cell Count, Fluid: 850 cu mm (ref 0–1000)

## 2016-07-11 LAB — PROTIME-INR
INR: 1.01
Prothrombin Time: 13.3 seconds (ref 11.4–15.2)

## 2016-07-11 LAB — CHOLESTEROL, TOTAL: Cholesterol: 148 mg/dL (ref 0–200)

## 2016-07-11 LAB — PROTEIN, TOTAL: Total Protein: 6.5 g/dL (ref 6.5–8.1)

## 2016-07-11 LAB — LACTATE DEHYDROGENASE: LDH: 137 U/L (ref 98–192)

## 2016-07-11 LAB — LACTATE DEHYDROGENASE, PLEURAL OR PERITONEAL FLUID: LD FL: 285 U/L — AB (ref 3–23)

## 2016-07-11 MED ORDER — ACETAMINOPHEN 650 MG RE SUPP: 650.0000 mg | Freq: Four times a day (QID) | RECTAL | Status: DC | PRN

## 2016-07-11 MED ORDER — VANCOMYCIN HCL IN DEXTROSE 1-5 GM/200ML-% IV SOLN
1000.0000 mg | Freq: Once | INTRAVENOUS | Status: DC
  Filled 2016-07-11: qty 200

## 2016-07-11 MED ORDER — PIPERACILLIN-TAZOBACTAM 3.375 G IVPB
3.3750 g | Freq: Three times a day (TID) | INTRAVENOUS | Status: DC
  Administered 2016-07-11 (×2): 3.375 g via INTRAVENOUS
  Filled 2016-07-11 (×4): qty 50

## 2016-07-11 MED ORDER — ONDANSETRON HCL 4 MG/2ML IJ SOLN: 4.0000 mg | Freq: Four times a day (QID) | INTRAMUSCULAR | Status: DC | PRN

## 2016-07-11 MED ORDER — DEXTROSE-NACL 5-0.9 % IV SOLN
INTRAVENOUS | Status: DC
  Administered 2016-07-11: 21:00:00 via INTRAVENOUS

## 2016-07-11 MED ORDER — PANTOPRAZOLE SODIUM 40 MG IV SOLR
40.0000 mg | Freq: Two times a day (BID) | INTRAVENOUS | Status: DC
  Administered 2016-07-11: 40 mg via INTRAVENOUS
  Filled 2016-07-11 (×2): qty 40

## 2016-07-11 MED ORDER — VANCOMYCIN HCL IN DEXTROSE 1-5 GM/200ML-% IV SOLN
1000.0000 mg | Freq: Two times a day (BID) | INTRAVENOUS | Status: DC
  Administered 2016-07-11: 1000 mg via INTRAVENOUS
  Filled 2016-07-11: qty 200

## 2016-07-11 MED ORDER — ONDANSETRON HCL 4 MG PO TABS: 4.0000 mg | ORAL_TABLET | Freq: Four times a day (QID) | ORAL | Status: DC | PRN

## 2016-07-11 MED ORDER — ACETAMINOPHEN 325 MG PO TABS: 650.0000 mg | ORAL_TABLET | Freq: Four times a day (QID) | ORAL | Status: DC | PRN

## 2016-07-11 MED ORDER — IOPAMIDOL (ISOVUE-300) INJECTION 61%
INTRAVENOUS | Status: AC
  Administered 2016-07-11: 150 mL via ORAL
  Filled 2016-07-11: qty 150

## 2016-07-11 MED ORDER — PIPERACILLIN-TAZOBACTAM 3.375 G IVPB 30 MIN
3.3750 g | Freq: Once | INTRAVENOUS | Status: AC
  Administered 2016-07-11: 3.375 g via INTRAVENOUS
  Filled 2016-07-11: qty 50

## 2016-07-11 NOTE — ED Notes (Signed)
Paged Dr. Raliegh Ip

## 2016-07-11 NOTE — Care Management Note (Signed)
Case Management Note  Patient Details  Name: ISAISH ALEMU MRN: [id number] Date of Birth: May 08, 1950  Subjective/Objective:      Hydronephrosis and early sepsis              Action/Plan:Date:  July 11, 2016 Chart reviewed for concurrent status and case management needs. Will continue to follow patient progress. Discharge Planning: following for needs Expected discharge date: [id number] Velva Harman, BSN, [place], [place]   Expected Discharge Date:   (unknown)               Expected Discharge Plan:  Home/Self Care  In-House Referral:     Discharge planning Services     Post Acute Care Choice:    Choice offered to:     DME Arranged:    DME Agency:     HH Arranged:    [place] Agency:     Status of Service:  In process, will continue to follow  If discussed at Long Length of Stay Meetings, dates discussed:    Additional Comments:  Leeroy Cha, RN 07/11/2016, 9:52 AM

## 2016-07-11 NOTE — Consult Note (Signed)
Name: [name] MRN: [id number] DOB: 07/28/1950    ADMISSION DATE:  07/10/2016 CONSULTATION DATE:  3/16  REFERRING MD :  Tyrell Antonio   CHIEF COMPLAINT:  Pleural effusion   BRIEF PATIENT DESCRIPTION:  66 year old male w/ h/o esophageal cancer and prior esophagectomy back in Sept 17. Followed at [facility] heme/onc. Recently treated for presumed PNA early march w/ concern for possible aspiration (high asp risk at baseline). Presents to ER on 3/15 w/ worsening dyspnea and CT imaging showing large bilateral effusions   SIGNIFICANT EVENTS    STUDIES:  CT Chest 3/15: No evidence of significant pulmonary embolus. Very large bilateral pleural effusions with atelectasis or consolidation in the mid and lower lungs bilaterally. The esophagus is markedly distended and filled with fluid and food. This could be due to postoperative esophagectomy with esophagogastrostomy but if the patient has not had this type of surgery, then this would indicate achalasia or distal esophageal obstruction. Free fluid in the upper abdomen may represent ascites   HISTORY OF PRESENT ILLNESS:    This is a 66 year old male w/ sig h/o esophageal cancer. He is s/p chemo and surgical esophagectomy back in sept 2009. He has since had his J tube removed and had been under routine f/u care w/ heme/onc at [facility]. He was in his usual state of health until early march this year when he developed cough which was productive of thick bile appearing mucous. He was seen by his PCP and felt to have PNA and started on antibiotics. He had actually been seen a second time, antibiotics widened and in spite of treatment started to notice he was getting more short of breath and the cough had not changed as of 3/14. Because these symptoms worsened he was advised to report the the ER. A CT of chest was done that was negative for Pulmonary Emboli but did show large bilateral pleural effusions w/ associated atelectasis. PCCM was asked to see in  consult  Re: these effusions.     PAST MEDICAL HISTORY :   has a past medical history of Allergy; Chronic kidney disease; Diverticulosis; DIVERTICULOSIS, COLON (12/30/2006); DVT (deep venous thrombosis) ([place]); Esophageal cancer ([place]) (09/18/2015); Hemorrhoids; Hernia, incisional; adenomatous colonic polyps; Hyperlipidemia; Hypertension; Obesity; Prostate cancer ([place]) (06/18/11); Prostate cancer ([place]); and Rhinitis.  has a past surgical history that includes Eye surgery; fx l tibia,fibula with external fixator (04-24-2008); remove fixator; varicocele repair; bilateral vein ligation; Prostatectomy (2013); Fracture surgery (2009); Colonoscopy (09-2008); Polypectomy (09-2008); and Esophagogastroduodenoscopy (egd) with propofol (N/A, 09/17/2015). Prior to Admission medications   Medication Sig Start Date End Date Taking? Authorizing Provider  aspirin 81 MG tablet Take 81 mg by mouth daily. Reported on 09/27/2015   Yes Historical Provider, MD  clindamycin (CLEOCIN) 150 MG capsule Take 3 capsules (450 mg total) by mouth 3 (three) times daily. 07/03/16  Yes Marin Olp, MD  Probiotic Product (ALIGN) 4 MG CAPS Take 4 mg by mouth daily.   Yes Historical Provider, MD   Allergies  Allergen Reactions  . Adhesive [Tape]     Irritation.  . Fentanyl     REACTION: N/V  . Latex     Irritation at site.  . Levofloxacin     Light headed  . Other Other (See Comments)    Pet dander--Runny nose and eyes, sneezing     FAMILY HISTORY:  family history includes Cancer in his paternal grandmother; Colon polyps in his father; Coronary artery disease in his maternal grandfather; Diabetes  in his father; Stroke in his father and maternal grandfather; Transient ischemic attack in his mother. SOCIAL HISTORY:  reports that he has never smoked. He has never used smokeless tobacco. He reports that he drinks alcohol. He reports that he does not use drugs.  REVIEW OF SYSTEMS:   Constitutional: Negative for fever, chills, weight  loss, malaise/fatigue and diaphoresis.  HENT: Negative for hearing loss, ear pain, nosebleeds, congestion, sore throat, neck pain, tinnitus and ear discharge.   Eyes: Negative for blurred vision, double vision, photophobia, pain, discharge and redness.  Respiratory +cough, hemoptysis, sputum production thick bile colored, shortness of breath, wheezing and stridor.   Cardiovascular: Negative for chest pain, palpitations, orthopnea, claudication, leg swelling and PND.  Gastrointestinal: Negative for heartburn, nausea, vomiting, abdominal pain, diarrhea, constipation, blood in stool and melena. + gurgling in neck Genitourinary: Negative for dysuria, urgency, frequency, hematuria and flank pain.  Musculoskeletal: Negative for myalgias, back pain, joint pain and falls.  Skin: Negative for itching and rash.  Neurological: Negative for dizziness, tingling, tremors, sensory change, speech change, focal weakness, seizures, loss of consciousness, weakness and headaches.  Endo/Heme/Allergies: Negative for environmental allergies and polydipsia. Does not bruise/bleed easily.  SUBJECTIVE:  No distress.  VITAL SIGNS: Temp:  [97.4 F (36.3 C)-99 F (37.2 C)] 99 F (37.2 C) (03/16 0900) Pulse Rate:  [76-199] 91 (03/16 0200) Resp:  [12-24] 20 (03/16 0600) BP: (106-156)/(78-96) 134/83 (03/16 0800) SpO2:  [90 %-99 %] 94 % (03/16 0800) Weight:  [189 lb 3.2 oz (85.8 kg)] 189 lb 3.2 oz (85.8 kg) (03/15 1437)  PHYSICAL EXAMINATION: General:  Frail 66 year old male, sitting up right in bed. No distress.  Neuro:  Awake, alert, no focal def  HEENT:  NCAT. No JVD MMM Cardiovascular:  rrr w/out mrg Lungs:  Clear, but decreased in bases.  Abdomen:  Soft, not tender + bowel sounds  Musculoskeletal:  Equal st and bulk  Skin:  Warm and dry    Recent Labs Lab 07/10/16 1710 07/11/16 0402  NA 142 141  K 4.3 4.0  CL 103 105  CO2 32 29  BUN 23* 21*  CREATININE 1.09 0.85  GLUCOSE 122* 130*    Recent  Labs Lab 07/10/16 1710 07/11/16 0402  HGB 16.4 16.1  HCT 47.7 47.9  WBC 12.0* 11.6*  PLT 239 236   Dg Chest 2 View  Result Date: 07/10/2016 CLINICAL DATA:  66 year old male being treated for root pneumonia for the prior 2 weeks with continued cough an shortness of breath. EXAM: CHEST  2 VIEW COMPARISON:  06/27/2016 and earlier. FINDINGS: Progressed bilateral pleural effusions now moderate to large. Extensive mid and lower lung compressive atelectasis, or less likely consolidation. Increased right peritracheal density may reflect a component of loculated effusion. Other visible mediastinal contours remain stable. No superimposed pneumothorax. No upper lobe pulmonary edema. Visualized tracheal air column is within normal limits. No acute osseous abnormality identified. Negative visible bowel gas pattern. IMPRESSION: 1. Progressed and now large bilateral pleural effusions since 06/27/2016. 2. New right peritracheal density might reflect a small loculated component of the right pleural fluid. Electronically Signed   By: Genevie Ann M.D.   On: 07/10/2016 16:30   Dg Abd 1 View  Result Date: 07/11/2016 CLINICAL DATA:  Esophageal cancer. EXAM: ABDOMEN - 1 VIEW COMPARISON:  CT 09/18/2015. FINDINGS: Soft tissue structures are unremarkable. Contrast noted in the renal collecting systems and bladder. Hydronephrosis on the right noted . This is a new finding from prior CT of  09/18/2015. No bowel distention. No acute bony abnormality. Surgical clips in the pelvis . IMPRESSION: 1. Contrast noted in the renal collecting systems and bladder. This is from prior chest CT. 2. Right hydronephrosis.  This is a new finding from 09/18/2015. Electronically Signed   By: Marcello Moores  Register   On: 07/11/2016 09:47   Ct Angio Chest Pe W/cm &/or Wo Cm  Result Date: 07/10/2016 CLINICAL DATA:  Shortness of breath. Diagnosed with pneumonia 2 weeks ago. Increasing shortness of breath and productive cough for 2 days. EXAM: CT ANGIOGRAPHY  CHEST WITH CONTRAST TECHNIQUE: Multidetector CT imaging of the chest was performed using the standard protocol during bolus administration of intravenous contrast. Multiplanar CT image reconstructions and MIPs were obtained to evaluate the vascular anatomy. CONTRAST:  100 mL Isovue 370 COMPARISON:  09/18/2015 FINDINGS: Cardiovascular: Satisfactory opacification of the pulmonary arteries to the segmental level. No evidence of pulmonary embolism. Normal heart size. No pericardial effusion. Normal caliber thoracic aorta. No aortic dissection. Scattered aortic calcifications. Mediastinum/Nodes: Prominent dilatation of the esophagus which is filled with food and liquid. Surgical clips are present suggesting that this may represent postoperative changes due to esophageal resection with esophagogastrostomy. If the patient has not had this type of surgery, this would indicate achalasia or distal esophageal obstruction. No significant lymphadenopathy in the chest. Lungs/Pleura: Very large bilateral pleural effusions. Volume loss in the lung bases likely representing atelectasis. Consolidation not excluded. No pneumothorax. Upper Abdomen: Small amount of upper abdominal ascites. There is somewhat heterogeneous nodular infiltration suggested in the mesenteric. This is nonspecific but could indicate mesenteric tumor spread. This area is incompletely imaged within the field of view. Musculoskeletal: Mild degenerative changes in the spine. No destructive bone lesions. Review of the MIP images confirms the above findings. IMPRESSION: No evidence of significant pulmonary embolus. Very large bilateral pleural effusions with atelectasis or consolidation in the mid and lower lungs bilaterally. The esophagus is markedly distended and filled with fluid and food. This could be due to postoperative esophagectomy with esophagogastrostomy but if the patient has not had this type of surgery, then this would indicate achalasia or distal  esophageal obstruction. Free fluid in the upper abdomen may represent ascites. The visualized mesenteric a somewhat heterogeneous which could indicate peritoneal tumor spread. Electronically Signed   By: Lucienne Capers M.D.   On: 07/10/2016 22:08   Bedside US: large bilateral pleural effusion.   ASSESSMENT / PLAN: Pneumonia  Large bilateral pleural effusion h/o esophageal cancer.   Discussion.  This is grossly exudate.  Most likely on ddx: parapneumonic vs empyema associated with either anastomotic erosion/leak or possibly aspiration. Also consider malignancy. -fluid analysis. Either way next people to be involved should be onc and thoracic services. He has relationship with [facility] at [facility].   Recommendation/plan Would go ahead and arranged [facility] transfer Will send the following lab work which will be available for review  LDH,protein, cell count, Gm stain, cytology, glucose, PH, culture and ADA Continue abx Will order gastrografin test to look for evidence of leak  I discussed this w/ primary team    Erick Colace ACNP-BC [place] Pager # [phone] OR # [phone] if no answer

## 2016-07-11 NOTE — Procedures (Signed)
Thoracentesis Procedure Note  Pre-operative Diagnosis: right pleural effusion   Post-operative Diagnosis: same  Indications: evaluation of fluid   Procedure Details  Consent: Informed consent was obtained. Risks of the procedure were discussed including: infection, bleeding, pain, pneumothorax.  Under sterile conditions the patient was positioned. Betadine solution and sterile drapes were utilized.  1% buffered lidocaine was used to anesthetize the  rib space on the right chest which was identified via real time [place]. Fluid was obtained without any difficulties and minimal blood loss.  A dressing was applied to the wound and wound care instructions were provided.   Findings 1500 ml of bloody pleural fluid was obtained. A sample was sent to Pathology  and cell counts, as well as for infection analysis.  Complications:  None; patient tolerated the procedure well.          Condition: stable  Plan A follow up chest x-ray was ordered. Bed Rest for 0 hours. Tylenol 650 mg. for pain.  Erick Colace ACNP-BC [place] Pager # [phone] OR # [phone] if no answer

## 2016-07-11 NOTE — ED Notes (Signed)
Pt given dinner of sandwich and a selection of ED snacks.  Updated that bed is ready

## 2016-07-11 NOTE — Progress Notes (Addendum)
Date: July 11, 2016 Discharge orders checked for needs. No case management needs present at time of discharge.  Patient to be transferred to [place]. Velva Harman, RN, BSN, [place]   [phone]

## 2016-07-11 NOTE — H&P (Signed)
History and Physical    [name] UVO:[id number] DOB: Aug 28, 1950 DOA: 07/10/2016  PCP: Garret Reddish, MD  Patient coming from: Home.  Chief Complaint: Shortness of breath and cough.  HPI: [name] is a 66 y.o. male with history of esophageal cancer status post surgery in [place] last September presents with complaints of worsening shortness of breath and cough over the last 2 weeks. On March 2 patient had x-rays showing pneumonia and effusion. Patient was placed on Z-Pak. Since patient's symptoms persisted patient was placed on clindamycin. Despite taking which patient has been having persistent productive cough and worsening shortness of breath.   ED Course: Chest x-ray in the ER shows remarkably worsening pleural effusion. CT angiogram of the chest was done which was negative for PE but showed large bilateral pleural effusion. Patient was short of breath in the ER but not in distress. Patient has been having copious amount of sputum production.  Review of Systems: As per HPI, rest all negative.   Past Medical History:  Diagnosis Date  . Allergy   . Chronic kidney disease    kidney stone  . Diverticulosis   . DIVERTICULOSIS, COLON 12/30/2006   Qualifier: Diagnosis of  By: Rogue Bussing CMA, Maryann Alar    . DVT (deep venous thrombosis) ([place])    1990s  . Esophageal cancer ([place]) 09/18/2015  . Hemorrhoids   . Hernia, incisional   . Hx of adenomatous colonic polyps   . Hyperlipidemia   . Hypertension   . Obesity   . 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 Prostate cancer ([place])   . Rhinitis     Past Surgical History:  Procedure Laterality Date  . bilateral vein ligation     varicose veins   . COLONOSCOPY  09-2008  . ESOPHAGOGASTRODUODENOSCOPY (EGD) WITH PROPOFOL N/A 09/17/2015   Procedure: ESOPHAGOGASTRODUODENOSCOPY (EGD) WITH PROPOFOL;  Surgeon: Manya Silvas, MD;  Location: [facility] ENDOSCOPY;  Service: Endoscopy;   Laterality: N/A;  . EYE SURGERY     as child   . FRACTURE SURGERY  2009  . fx l tibia,fibula with external fixator  04-24-2008  . POLYPECTOMY  09-2008  . PROSTATECTOMY  2013   Open radical prostatecomy  . remove fixator    . varicocele repair     1990's     reports that he has never smoked. He has never used smokeless tobacco. He reports that he drinks alcohol. He reports that he does not use drugs.  Allergies  Allergen Reactions  . Adhesive [Tape]     Irritation.  . Fentanyl     REACTION: N/V  . Latex     Irritation at site.  . Levofloxacin     Light headed  . Other Other (See Comments)    Pet dander--Runny nose and eyes, sneezing     Family History  Problem Relation Age of Onset  . Diabetes Father   . Colon polyps Father   . Stroke Father     11 former smoker  . Coronary artery disease Maternal Grandfather     67 never smoker  . Stroke Maternal Grandfather   . Cancer Paternal Grandmother     pancreatic, breast  . Transient ischemic attack Mother     73, 61  . Colon cancer Neg Hx   . Esophageal cancer Neg Hx   . Rectal cancer Neg Hx   . Stomach cancer Neg Hx     Prior to Admission medications   Medication  Sig Start Date End Date Taking? Authorizing Provider  aspirin 81 MG tablet Take 81 mg by mouth daily. Reported on 09/27/2015   Yes Historical Provider, MD  clindamycin (CLEOCIN) 150 MG capsule Take 3 capsules (450 mg total) by mouth 3 (three) times daily. 07/03/16  Yes Marin Olp, MD  Probiotic Product (ALIGN) 4 MG CAPS Take 4 mg by mouth daily.   Yes Historical Provider, MD    Physical Exam: Vitals:   07/10/16 2321 07/10/16 2345 07/10/16 2352 07/10/16 2356  BP: 131/89   (!) 118/93  Pulse: 92 95 92 91  Resp: (!) 21 12 20  (!) 21  Temp:      TempSrc:      SpO2: 94% 94% 92% 99%      Constitutional: Moderately built and nourished. Vitals:   07/10/16 2321 07/10/16 2345 07/10/16 2352 07/10/16 2356  BP: 131/89   (!) 118/93  Pulse: 92 95 92 91    Resp: (!) 21 12 20  (!) 21  Temp:      TempSrc:      SpO2: 94% 94% 92% 99%   Eyes: Anicteric no pallor. ENMT: No discharge from the ears eyes nose and mouth. Neck: No mass felt. No JVD appreciated. Respiratory: No rhonchi or crepitations. Bilateral dullness on percussion. Cardiovascular: S1-S2 heard no murmurs appreciated. Abdomen: Soft nontender bowel sounds present. Musculoskeletal: No edema. No joint effusion. Skin: No rash. Neurologic: Alert awake oriented to time place and person. Moves all extremities. Psychiatric: Appears normal. Normal affect.   Labs on Admission: I have personally reviewed following labs and imaging studies  CBC:  Recent Labs Lab 07/10/16 1710  WBC 12.0*  HGB 16.4  HCT 47.7  MCV 86.7  PLT 401   Basic Metabolic Panel:  Recent Labs Lab 07/10/16 1710  NA 142  K 4.3  CL 103  CO2 32  GLUCOSE 122*  BUN 23*  CREATININE 1.09  CALCIUM 9.1   GFR: Estimated Creatinine Clearance: 74.3 mL/min (by C-G formula based on SCr of 1.09 mg/dL). Liver Function Tests:  Recent Labs Lab 07/10/16 1710  AST 25  ALT 18  ALKPHOS 59  BILITOT 0.7  PROT 7.2  ALBUMIN 3.6   No results for input(s): LIPASE, AMYLASE in the last 168 hours. No results for input(s): AMMONIA in the last 168 hours. Coagulation Profile: No results for input(s): INR, PROTIME in the last 168 hours. Cardiac Enzymes: No results for input(s): CKTOTAL, CKMB, CKMBINDEX, TROPONINI in the last 168 hours. BNP (last 3 results)  Recent Labs  06/26/16 1705  PROBNP 34.0   HbA1C: No results for input(s): HGBA1C in the last 72 hours. CBG: No results for input(s): GLUCAP in the last 168 hours. Lipid Profile: No results for input(s): CHOL, HDL, LDLCALC, TRIG, CHOLHDL, LDLDIRECT in the last 72 hours. Thyroid Function Tests: No results for input(s): TSH, T4TOTAL, FREET4, T3FREE, THYROIDAB in the last 72 hours. Anemia Panel: No results for input(s): VITAMINB12, FOLATE, FERRITIN, TIBC,  IRON, RETICCTPCT in the last 72 hours. Urine analysis:    Component Value Date/Time   COLORURINE YELLOW 11/27/2011 0412   APPEARANCEUR CLEAR 11/27/2011 0412   LABSPEC 1.021 11/27/2011 0412   PHURINE 6.0 11/27/2011 0412   GLUCOSEU NEGATIVE 11/27/2011 0412   HGBUR SMALL (A) 11/27/2011 0412   HGBUR negative 02/12/2009 0757   BILIRUBINUR n 03/16/2015 0933   KETONESUR NEGATIVE 11/27/2011 0412   PROTEINUR n 03/16/2015 0933   PROTEINUR NEGATIVE 11/27/2011 0412   UROBILINOGEN 0.2 03/16/2015 0933   UROBILINOGEN 0.2  11/27/2011 0412   NITRITE n 03/16/2015 0933   NITRITE NEGATIVE 11/27/2011 0412   LEUKOCYTESUR Negative 03/16/2015 0933   Sepsis Labs: @LABRCNTIP (procalcitonin:4,lacticidven:4) )No results found for this or any previous visit (from the past 240 hour(s)).   Radiological Exams on Admission: Dg Chest 2 View  Result Date: 07/10/2016 CLINICAL DATA:  66 year old male being treated for root pneumonia for the prior 2 weeks with continued cough an shortness of breath. EXAM: CHEST  2 VIEW COMPARISON:  06/27/2016 and earlier. FINDINGS: Progressed bilateral pleural effusions now moderate to large. Extensive mid and lower lung compressive atelectasis, or less likely consolidation. Increased right peritracheal density may reflect a component of loculated effusion. Other visible mediastinal contours remain stable. No superimposed pneumothorax. No upper lobe pulmonary edema. Visualized tracheal air column is within normal limits. No acute osseous abnormality identified. Negative visible bowel gas pattern. IMPRESSION: 1. Progressed and now large bilateral pleural effusions since 06/27/2016. 2. New right peritracheal density might reflect a small loculated component of the right pleural fluid. Electronically Signed   By: Genevie Ann M.D.   On: 07/10/2016 16:30   Ct Angio Chest Pe W/cm &/or Wo Cm  Result Date: 07/10/2016 CLINICAL DATA:  Shortness of breath. Diagnosed with pneumonia 2 weeks ago. Increasing  shortness of breath and productive cough for 2 days. EXAM: CT ANGIOGRAPHY CHEST WITH CONTRAST TECHNIQUE: Multidetector CT imaging of the chest was performed using the standard protocol during bolus administration of intravenous contrast. Multiplanar CT image reconstructions and MIPs were obtained to evaluate the vascular anatomy. CONTRAST:  100 mL Isovue 370 COMPARISON:  09/18/2015 FINDINGS: Cardiovascular: Satisfactory opacification of the pulmonary arteries to the segmental level. No evidence of pulmonary embolism. Normal heart size. No pericardial effusion. Normal caliber thoracic aorta. No aortic dissection. Scattered aortic calcifications. Mediastinum/Nodes: Prominent dilatation of the esophagus which is filled with food and liquid. Surgical clips are present suggesting that this may represent postoperative changes due to esophageal resection with esophagogastrostomy. If the patient has not had this type of surgery, this would indicate achalasia or distal esophageal obstruction. No significant lymphadenopathy in the chest. Lungs/Pleura: Very large bilateral pleural effusions. Volume loss in the lung bases likely representing atelectasis. Consolidation not excluded. No pneumothorax. Upper Abdomen: Small amount of upper abdominal ascites. There is somewhat heterogeneous nodular infiltration suggested in the mesenteric. This is nonspecific but could indicate mesenteric tumor spread. This area is incompletely imaged within the field of view. Musculoskeletal: Mild degenerative changes in the spine. No destructive bone lesions. Review of the MIP images confirms the above findings. IMPRESSION: No evidence of significant pulmonary embolus. Very large bilateral pleural effusions with atelectasis or consolidation in the mid and lower lungs bilaterally. The esophagus is markedly distended and filled with fluid and food. This could be due to postoperative esophagectomy with esophagogastrostomy but if the patient has not  had this type of surgery, then this would indicate achalasia or distal esophageal obstruction. Free fluid in the upper abdomen may represent ascites. The visualized mesenteric a somewhat heterogeneous which could indicate peritoneal tumor spread. Electronically Signed   By: Lucienne Capers M.D.   On: 07/10/2016 22:08     Assessment/Plan Principal Problem:   Acute respiratory failure (HCC) Active Problems:   Gout   Esophageal cancer (HCC)   Bilateral pleural effusion   Acute respiratory failure with hypoxia (HCC)    1. Acute respiratory failure with hypoxia probably secondary to large bilateral pleural effusion and pneumonia - I have placed patient on [place] antibiotics for community-acquired  pneumonia. Follow blood cultures urine for Legionella strep antigen. Follow sputum cultures. 2. Bilateral large pleural effusion - I have requested pulmonary critical care consult for possible need for thoracentesis. 3. History of esophageal cancer status post surgery in [place] last September - follow thoracentesis labs to rule out any metastasis.   DVT prophylaxis: SCDs. Code Status: Full code.  Family Communication: Patient's wife.  Disposition Plan: Home.  Consults called: Pulmonary critical care.  Admission status: Inpatient.    Rise Patience MD [facility] Pager [phone].  If 7PM-7AM, please contact night-coverage [email or web address] Password [facility]  07/11/2016, 12:39 AM

## 2016-07-11 NOTE — ED Notes (Signed)
Report given to ICU.  Will hang antibiotic and then take pt up to 2W

## 2016-07-11 NOTE — Progress Notes (Signed)
Pharmacy Antibiotic Note  [name] is a 66 y.o. male admitted on 07/10/2016 with pneumonia.  Pharmacy has been consulted for Vancomycin and Zosyn dosing.  Plan: Vancomycin 1gm iv q12hr Goal 15-20 Zosyn 3.375g IV q8h (4 hour infusion).  Daily Serum Creatinine      Temp (24hrs), Avg:97.7 F (36.5 C), Min:97.4 F (36.3 C), Max:98.2 F (36.8 C)   Recent Labs Lab 07/10/16 1710  WBC 12.0*  CREATININE 1.09    Estimated Creatinine Clearance: 74.3 mL/min (by C-G formula based on SCr of 1.09 mg/dL).    Allergies  Allergen Reactions  . Adhesive [Tape]     Irritation.  . Fentanyl     REACTION: N/V  . Latex     Irritation at site.  . Levofloxacin     Light headed  . Other Other (See Comments)    Pet dander--Runny nose and eyes, sneezing     Antimicrobials this admission: Vancomycin 07/11/2016 >> Zosyn 07/11/2016 >>   Dose adjustments this admission: -  Microbiology results: pending  Thank you for allowing pharmacy to be a part of this patient's care.  [name] 07/11/2016 1:03 AM

## 2016-07-11 NOTE — Discharge Summary (Signed)
Physician Discharge Summary  [name] IHK:[id number] DOB: [birth year] DOA: 07/10/2016  PCP: Garret Reddish, MD  Admit date: 07/10/2016 Discharge date: 07/11/2016  Admitted From:  Home  Disposition: Transfer to [facility]   Recommendations for Outpatient Follow-up:  1. Transfer to [place].  2. Needs oncology, cardiothoracic surgeon evaluation  3. Please follow up on the following pending results:   Discharge Condition;stable.  CODE STATUS; Full code.  Diet recommendation: NPO  Brief/Interim Summary: [name] a 66 y.o.malewith history of esophageal cancer status post surgery in [place] [facility] last September presents with complaints of worsening shortness of breath and cough over the last 2 weeks. She was treated outpatient with Z pack and clindamycin for PNA.    Assessment & Plan:   Principal Problem:   Acute respiratory failure (HCC) Active Problems:   Gout   Esophageal cancer (HCC)   Bilateral pleural effusion   Acute respiratory failure with hypoxia (HCC)  1-Acute hypoxic Respiratory Failure;  Secondary to PNA and pleural effusion.  CCM consulted for therapeutic and diagnosis thoracentesis, does he needs bronch ? Is his recurrent PNA related to aspiration.   Patient underwent thoracentesis, concern with exudative effusion. Differential; empyema, para pneumonic effusion, concern with anastomotic /leak erosion or possible aspiration. Transfer to [facility] for further evaluation with cardiothoracic sx and oncology.  gastrografin will be ordered.   2-PNA, fail oral antibiotics.  Continue with vancomycin and Zosyn.  CCM consulted for further evaluation.   3-Bilateral Large pleural effusion;  CCM consulted for thoracentesis.   4-History of esophageal cancer status post surgery in [facility] last September - follow thoracentesis labs to rule out any metastasis. He had Esophagectomy, Pharyngo-gastrostomy.  He report pumping sound in his upper air way, throat,  like air coming up. He vomited or  "Cough " last Wednesday brown fluid. CCM to evaluate.   Will make him NPO. Will check KUB. Will contact his Doctors at [facility].  Will await evaluation by CCM, patient might need to be transfer to [facility],  -He had BM 3-15 AM.    Discharge Diagnoses:  Principal Problem:   Acute respiratory failure ([place]) Active Problems:   Gout   Esophageal cancer ([place])   Bilateral pleural effusion   Acute respiratory failure with hypoxia ([place])    Discharge Instructions   Allergies as of 07/11/2016      Reactions   Adhesive [tape]    Irritation.   Fentanyl    REACTION: N/V   Latex    Irritation at site.   Levofloxacin    Light headed   Other Other (See Comments)   Pet dander--Runny nose and eyes, sneezing      Medication List    STOP taking these medications   ALIGN 4 MG Caps   aspirin 81 MG tablet   clindamycin 150 MG capsule Commonly known as:  CLEOCIN       Allergies  Allergen Reactions  . Adhesive [Tape]     Irritation.  . Fentanyl     REACTION: N/V  . Latex     Irritation at site.  . Levofloxacin     Light headed  . Other Other (See Comments)    Pet dander--Runny nose and eyes, sneezing     Consultations:  CCM   Procedures/Studies: Dg Chest 2 View  Result Date: 07/10/2016 CLINICAL DATA:  66 year old male being treated for root pneumonia for the prior 2 weeks with continued cough an shortness of breath. EXAM: CHEST  2 VIEW COMPARISON:  06/27/2016 and earlier.  FINDINGS: Progressed bilateral pleural effusions now moderate to large. Extensive mid and lower lung compressive atelectasis, or less likely consolidation. Increased right peritracheal density may reflect a component of loculated effusion. Other visible mediastinal contours remain stable. No superimposed pneumothorax. No upper lobe pulmonary edema. Visualized tracheal air column is within normal limits. No acute osseous abnormality identified. Negative visible bowel gas pattern.  IMPRESSION: 1. Progressed and now large bilateral pleural effusions since 06/27/2016. 2. New right peritracheal density might reflect a small loculated component of the right pleural fluid. Electronically Signed   By: Genevie Ann M.D.   On: 07/10/2016 16:30   Dg Chest 2 View  Result Date: 06/27/2016 CLINICAL DATA:  Shortness of breath in cough. History of esophageal cancer. EXAM: CHEST  2 VIEW COMPARISON:  Chest CT 09/18/2015 and radiographs 09/11/2012 FINDINGS: The cardiac silhouette is partially obscured but appears grossly unchanged and within normal limits in size. The thoracic aorta is slightly tortuous. The lungs are hypoinflated with new small bilateral pleural effusions and right greater than left basilar parenchymal lung opacities. No pneumothorax is identified. No acute osseous abnormality is seen. IMPRESSION: New small bilateral pleural effusions with bibasilar atelectasis versus pneumonia. Electronically Signed   By: Logan Bores M.D.   On: 06/27/2016 08:57   Dg Abd 1 View  Result Date: 07/11/2016 CLINICAL DATA:  Esophageal cancer. EXAM: ABDOMEN - 1 VIEW COMPARISON:  CT 09/18/2015. FINDINGS: Soft tissue structures are unremarkable. Contrast noted in the renal collecting systems and bladder. Hydronephrosis on the right noted . This is a new finding from prior CT of 09/18/2015. No bowel distention. No acute bony abnormality. Surgical clips in the pelvis . IMPRESSION: 1. Contrast noted in the renal collecting systems and bladder. This is from prior chest CT. 2. Right hydronephrosis.  This is a new finding from 09/18/2015. Electronically Signed   By: Marcello Moores  Register   On: 07/11/2016 09:47   Ct Angio Chest Pe W/cm &/or Wo Cm  Result Date: 07/10/2016 CLINICAL DATA:  Shortness of breath. Diagnosed with pneumonia 2 weeks ago. Increasing shortness of breath and productive cough for 2 days. EXAM: CT ANGIOGRAPHY CHEST WITH CONTRAST TECHNIQUE: Multidetector CT imaging of the chest was performed using the  standard protocol during bolus administration of intravenous contrast. Multiplanar CT image reconstructions and MIPs were obtained to evaluate the vascular anatomy. CONTRAST:  100 mL Isovue 370 COMPARISON:  09/18/2015 FINDINGS: Cardiovascular: Satisfactory opacification of the pulmonary arteries to the segmental level. No evidence of pulmonary embolism. Normal heart size. No pericardial effusion. Normal caliber thoracic aorta. No aortic dissection. Scattered aortic calcifications. Mediastinum/Nodes: Prominent dilatation of the esophagus which is filled with food and liquid. Surgical clips are present suggesting that this may represent postoperative changes due to esophageal resection with esophagogastrostomy. If the patient has not had this type of surgery, this would indicate achalasia or distal esophageal obstruction. No significant lymphadenopathy in the chest. Lungs/Pleura: Very large bilateral pleural effusions. Volume loss in the lung bases likely representing atelectasis. Consolidation not excluded. No pneumothorax. Upper Abdomen: Small amount of upper abdominal ascites. There is somewhat heterogeneous nodular infiltration suggested in the mesenteric. This is nonspecific but could indicate mesenteric tumor spread. This area is incompletely imaged within the field of view. Musculoskeletal: Mild degenerative changes in the spine. No destructive bone lesions. Review of the MIP images confirms the above findings. IMPRESSION: No evidence of significant pulmonary embolus. Very large bilateral pleural effusions with atelectasis or consolidation in the mid and lower lungs bilaterally. The esophagus is  markedly distended and filled with fluid and food. This could be due to postoperative esophagectomy with esophagogastrostomy but if the patient has not had this type of surgery, then this would indicate achalasia or distal esophageal obstruction. Free fluid in the upper abdomen may represent ascites. The visualized  mesenteric a somewhat heterogeneous which could indicate peritoneal tumor spread. Electronically Signed   By: Lucienne Capers M.D.   On: 07/10/2016 22:08   Dg Chest Port 1 View  Result Date: 07/11/2016 CLINICAL DATA:  Followup right thoracentesis. EXAM: PORTABLE CHEST 1 VIEW COMPARISON:  07/10/2016 FINDINGS: Large bilateral effusions again demonstrated. Subjectively, these may be slightly smaller. No pneumothorax. Compressive atelectasis of the lower lungs as seen previously. IMPRESSION: Possible slight decrease in the amount of pleural fluid. No pneumothorax. Persistent large bilateral effusions with dependent pulmonary atelectasis. These results will be called to the ordering clinician or representative by the Radiologist Assistant, and communication documented in the PACS or zVision Dashboard. Electronically Signed   By: Nelson Chimes M.D.   On: 07/11/2016 15:15          Discharge Exam: Vitals:   07/11/16 1400 07/11/16 1452  BP:  115/75  Pulse:    Resp: 19   Temp:     Vitals:   07/11/16 0800 07/11/16 0900 07/11/16 1400 07/11/16 1452  BP: 134/83   115/75  Pulse:      Resp:   19   Temp:  99 F (37.2 C)    TempSrc:  Oral    SpO2: 94%  96%   Height:        General: [name] is alert, awake, not in acute distress Cardiovascular: RRR, S1/S2 +, no rubs, no gallops Respiratory: CTA bilaterally, no wheezing, no rhonchi Abdominal: Soft, NT, ND, bowel sounds + Extremities: no edema, no cyanosis    The results of significant diagnostics from this hospitalization (including imaging, microbiology, ancillary and laboratory) are listed below for reference.     Microbiology: Recent Results (from the past 240 hour(s))  MRSA PCR Screening     Status: None   Collection Time: 07/11/16  1:34 AM  Result Value Ref Range Status   MRSA by PCR NEGATIVE NEGATIVE Final    Comment:        The GeneXpert MRSA Assay (FDA approved for NASAL specimens only), is one component of a comprehensive MRSA  colonization surveillance program. It is not intended to diagnose MRSA infection nor to guide or monitor treatment for MRSA infections.      Labs: BNP (last 3 results)  Recent Labs  07/10/16 1653  BNP 36.6   Basic Metabolic Panel:  Recent Labs Lab 07/10/16 1710 07/11/16 0402  NA 142 141  K 4.3 4.0  CL 103 105  CO2 32 29  GLUCOSE 122* 130*  BUN 23* 21*  CREATININE 1.09 0.85  CALCIUM 9.1 8.7*   Liver Function Tests:  Recent Labs Lab 07/10/16 1710  AST 25  ALT 18  ALKPHOS 59  BILITOT 0.7  PROT 7.2  ALBUMIN 3.6   No results for input(s): LIPASE, AMYLASE in the last 168 hours. No results for input(s): AMMONIA in the last 168 hours. CBC:  Recent Labs Lab 07/10/16 1710 07/11/16 0402  WBC 12.0* 11.6*  HGB 16.4 16.1  HCT 47.7 47.9  MCV 86.7 86.6  PLT 239 236   Cardiac Enzymes: No results for input(s): CKTOTAL, CKMB, CKMBINDEX, TROPONINI in the last 168 hours. BNP: Invalid input(s): POCBNP CBG: No results for input(s): GLUCAP in the last  168 hours. D-Dimer  Recent Labs  07/10/16 1710  DDIMER 1.46*   Hgb A1c No results for input(s): HGBA1C in the last 72 hours. Lipid Profile No results for input(s): CHOL, HDL, LDLCALC, TRIG, CHOLHDL, LDLDIRECT in the last 72 hours. Thyroid function studies No results for input(s): TSH, T4TOTAL, T3FREE, THYROIDAB in the last 72 hours.  Invalid input(s): FREET3 Anemia work up No results for input(s): VITAMINB12, FOLATE, FERRITIN, TIBC, IRON, RETICCTPCT in the last 72 hours. Urinalysis    Component Value Date/Time   COLORURINE YELLOW 11/27/2011 0412   APPEARANCEUR CLEAR 11/27/2011 0412   LABSPEC 1.021 11/27/2011 0412   PHURINE 6.0 11/27/2011 0412   GLUCOSEU NEGATIVE 11/27/2011 0412   HGBUR SMALL (A) 11/27/2011 0412   HGBUR negative 02/12/2009 0757   BILIRUBINUR n 03/16/2015 0933   KETONESUR NEGATIVE 11/27/2011 0412   PROTEINUR n 03/16/2015 0933   PROTEINUR NEGATIVE 11/27/2011 0412   UROBILINOGEN 0.2  03/16/2015 0933   UROBILINOGEN 0.2 11/27/2011 0412   NITRITE n 03/16/2015 0933   NITRITE NEGATIVE 11/27/2011 0412   LEUKOCYTESUR Negative 03/16/2015 0933   Sepsis Labs Invalid input(s): PROCALCITONIN,  WBC,  LACTICIDVEN Microbiology Recent Results (from the past 240 hour(s))  MRSA PCR Screening     Status: None   Collection Time: 07/11/16  1:34 AM  Result Value Ref Range Status   MRSA by PCR NEGATIVE NEGATIVE Final    Comment:        The GeneXpert MRSA Assay (FDA approved for NASAL specimens only), is one component of a comprehensive MRSA colonization surveillance program. It is not intended to diagnose MRSA infection nor to guide or monitor treatment for MRSA infections.      Time coordinating discharge: Over 30 minutes  SIGNED:   Elmarie Shiley, MD  [facility] 07/11/2016, 3:32 PM Pager   If 7PM-7AM, please contact night-coverage [email or web address] Password [email or web address]

## 2016-07-11 NOTE — Progress Notes (Addendum)
PROGRESS NOTE    [name]  GBT:[id number] DOB: [birth year] DOA: 07/10/2016 PCP: Garret Reddish, MD   Brief Narrative: [name] is a 66 y.o. male with history of esophageal cancer status post surgery in [place] last September presents with complaints of worsening shortness of breath and cough over the last 2 weeks. She was treated outpatient with Z pack and clindamycin for PNA.    Assessment & Plan:   Principal Problem:   Acute respiratory failure (HCC) Active Problems:   Gout   Esophageal cancer (HCC)   Bilateral pleural effusion   Acute respiratory failure with hypoxia (HCC)  1-Acute hypoxic Respiratory Failure;  Secondary to PNA and pleural effusion.  CCM consulted for therapeutic and diagnosis thoracentesis, does he needs bronch ? Is his recurrent PNA related to aspiration.    2-PNA, fail oral antibiotics.  Continue with vancomycin and Zosyn.  CCM consulted for further evaluation.   3-Bilateral Large pleural effusion;  CCM consulted for thoracentesis.   4-History of esophageal cancer status post surgery in [facility] last September - follow thoracentesis labs to rule out any metastasis. He had Esophagectomy, Pharyngo-gastrostomy.  He report pumping sound in his upper air way, throat, like air coming up. He vomited or  "Cough " last Wednesday brown fluid. CCM to evaluate.   Will make him NPO. Will check KUB. Will contact his Doctors at [facility].  Will await evaluation by CCM, patient might need to be transfer to [place],  -He had BM 3-15 AM.   DVT prophylaxis: SCD , awaiting thoracentesis.  Code Status: Full code.  Family Communication: Wife who was at bedside.  Disposition Plan: remain in the step down.    Consultants:   CCM   Procedures:  none   Antimicrobials:   Zosyn 3-16  Vancomycin 3-16   Subjective: He report difficulty breathing on lying down and at rest.  He has been coughing . On Wenadsday he vomited brown secretions.  He  report a stranage noise, feeling in his throat, like air or a pup.   Objective: Vitals:   07/11/16 0300 07/11/16 0308 07/11/16 0554 07/11/16 0600  BP: 128/85  (!) 156/94 (!) 149/91  Pulse:      Resp: 17  19 20   Temp:  98.1 F (36.7 C)    TempSrc:  Oral    SpO2: 96%  92% 90%  Height:        Intake/Output Summary (Last 24 hours) at 07/11/16 0718 Last data filed at 07/11/16 0222  Gross per 24 hour  Intake              170 ml  Output                0 ml  Net              170 ml   There were no vitals filed for this visit.  Examination:  General exam: in no acute distress, sitting up in chair,  Respiratory system: mild tachypnea, decreased breaths sound bilaterally.  Cardiovascular system: S1 & S2 heard, RRR. No JVD, murmurs, rubs, gallops or clicks. No pedal edema. Gastrointestinal system: Abdomen is nondistended, soft and nontender. No organomegaly or masses felt. Normal bowel sounds heard. Central nervous system: Alert and oriented. No focal neurological deficits. Extremities: Symmetric 5 x 5 power. Skin: No rashes, lesions or ulcers Psychiatry: Judgement and insight appear normal. Mood & affect appropriate.     Data Reviewed: I have personally reviewed following labs and  imaging studies  CBC:  Recent Labs Lab 07/10/16 1710 07/11/16 0402  WBC 12.0* 11.6*  HGB 16.4 16.1  HCT 47.7 47.9  MCV 86.7 86.6  PLT 239 481   Basic Metabolic Panel:  Recent Labs Lab 07/10/16 1710 07/11/16 0402  NA 142 141  K 4.3 4.0  CL 103 105  CO2 32 29  GLUCOSE 122* 130*  BUN 23* 21*  CREATININE 1.09 0.85  CALCIUM 9.1 8.7*   GFR: Estimated Creatinine Clearance: 95.3 mL/min (by C-G formula based on SCr of 0.85 mg/dL). Liver Function Tests:  Recent Labs Lab 07/10/16 1710  AST 25  ALT 18  ALKPHOS 59  BILITOT 0.7  PROT 7.2  ALBUMIN 3.6   No results for input(s): LIPASE, AMYLASE in the last 168 hours. No results for input(s): AMMONIA in the last 168 hours. Coagulation  Profile:  Recent Labs Lab 07/11/16 0402  INR 1.01   Cardiac Enzymes: No results for input(s): CKTOTAL, CKMB, CKMBINDEX, TROPONINI in the last 168 hours. BNP (last 3 results)  Recent Labs  06/26/16 1705  PROBNP 34.0   HbA1C: No results for input(s): HGBA1C in the last 72 hours. CBG: No results for input(s): GLUCAP in the last 168 hours. Lipid Profile: No results for input(s): CHOL, HDL, LDLCALC, TRIG, CHOLHDL, LDLDIRECT in the last 72 hours. Thyroid Function Tests: No results for input(s): TSH, T4TOTAL, FREET4, T3FREE, THYROIDAB in the last 72 hours. Anemia Panel: No results for input(s): VITAMINB12, FOLATE, FERRITIN, TIBC, IRON, RETICCTPCT in the last 72 hours. Sepsis Labs: No results for input(s): PROCALCITON, LATICACIDVEN in the last 168 hours.  Recent Results (from the past 240 hour(s))  MRSA PCR Screening     Status: None   Collection Time: 07/11/16  1:34 AM  Result Value Ref Range Status   MRSA by PCR NEGATIVE NEGATIVE Final    Comment:        The GeneXpert MRSA Assay (FDA approved for NASAL specimens only), is one component of a comprehensive MRSA colonization surveillance program. It is not intended to diagnose MRSA infection nor to guide or monitor treatment for MRSA infections.          Radiology Studies: Dg Chest 2 View  Result Date: 07/10/2016 CLINICAL DATA:  66 year old male being treated for root pneumonia for the prior 2 weeks with continued cough an shortness of breath. EXAM: CHEST  2 VIEW COMPARISON:  06/27/2016 and earlier. FINDINGS: Progressed bilateral pleural effusions now moderate to large. Extensive mid and lower lung compressive atelectasis, or less likely consolidation. Increased right peritracheal density may reflect a component of loculated effusion. Other visible mediastinal contours remain stable. No superimposed pneumothorax. No upper lobe pulmonary edema. Visualized tracheal air column is within normal limits. No acute osseous  abnormality identified. Negative visible bowel gas pattern. IMPRESSION: 1. Progressed and now large bilateral pleural effusions since 06/27/2016. 2. New right peritracheal density might reflect a small loculated component of the right pleural fluid. Electronically Signed   By: Genevie Ann M.D.   On: 07/10/2016 16:30   Ct Angio Chest Pe W/cm &/or Wo Cm  Result Date: 07/10/2016 CLINICAL DATA:  Shortness of breath. Diagnosed with pneumonia 2 weeks ago. Increasing shortness of breath and productive cough for 2 days. EXAM: CT ANGIOGRAPHY CHEST WITH CONTRAST TECHNIQUE: Multidetector CT imaging of the chest was performed using the standard protocol during bolus administration of intravenous contrast. Multiplanar CT image reconstructions and MIPs were obtained to evaluate the vascular anatomy. CONTRAST:  100 mL Isovue 370 COMPARISON:  09/18/2015 FINDINGS: Cardiovascular: Satisfactory opacification of the pulmonary arteries to the segmental level. No evidence of pulmonary embolism. Normal heart size. No pericardial effusion. Normal caliber thoracic aorta. No aortic dissection. Scattered aortic calcifications. Mediastinum/Nodes: Prominent dilatation of the esophagus which is filled with food and liquid. Surgical clips are present suggesting that this may represent postoperative changes due to esophageal resection with esophagogastrostomy. If the patient has not had this type of surgery, this would indicate achalasia or distal esophageal obstruction. No significant lymphadenopathy in the chest. Lungs/Pleura: Very large bilateral pleural effusions. Volume loss in the lung bases likely representing atelectasis. Consolidation not excluded. No pneumothorax. Upper Abdomen: Small amount of upper abdominal ascites. There is somewhat heterogeneous nodular infiltration suggested in the mesenteric. This is nonspecific but could indicate mesenteric tumor spread. This area is incompletely imaged within the field of view. Musculoskeletal:  Mild degenerative changes in the spine. No destructive bone lesions. Review of the MIP images confirms the above findings. IMPRESSION: No evidence of significant pulmonary embolus. Very large bilateral pleural effusions with atelectasis or consolidation in the mid and lower lungs bilaterally. The esophagus is markedly distended and filled with fluid and food. This could be due to postoperative esophagectomy with esophagogastrostomy but if the patient has not had this type of surgery, then this would indicate achalasia or distal esophageal obstruction. Free fluid in the upper abdomen may represent ascites. The visualized mesenteric a somewhat heterogeneous which could indicate peritoneal tumor spread. Electronically Signed   By: Lucienne Capers M.D.   On: 07/10/2016 22:08        Scheduled Meds: . piperacillin-tazobactam (ZOSYN)  IV  3.375 g Intravenous Q8H  . vancomycin  1,000 mg Intravenous Once  . vancomycin  1,000 mg Intravenous Q12H   Continuous Infusions:   LOS: 1 day    Time spent: 35 minutes,     Trystan Eads, Cassie Freer, MD [facility] Pager [phone]  If 7PM-7AM, please contact night-coverage [email or web address] Password [facility] 07/11/2016, 7:18 AM

## 2016-07-11 NOTE — ED Notes (Signed)
Multiple pillows gathered and helped pt get into bed and be as comfortable as possible to get some rest as he states that he is very exhausted from sitting up to help himself breathe.  With 3 pillows pt is able to tolerate being semi sitting on the stretcher.

## 2016-07-12 DIAGNOSIS — I82612 Acute embolism and thrombosis of superficial veins of left upper extremity: Secondary | ICD-10-CM | POA: Diagnosis not present

## 2016-07-12 DIAGNOSIS — C159 Malignant neoplasm of esophagus, unspecified: Secondary | ICD-10-CM | POA: Diagnosis not present

## 2016-07-12 DIAGNOSIS — Z66 Do not resuscitate: Secondary | ICD-10-CM | POA: Diagnosis not present

## 2016-07-12 DIAGNOSIS — C782 Secondary malignant neoplasm of pleura: Secondary | ICD-10-CM | POA: Diagnosis not present

## 2016-07-12 DIAGNOSIS — C799 Secondary malignant neoplasm of unspecified site: Secondary | ICD-10-CM | POA: Diagnosis not present

## 2016-07-12 DIAGNOSIS — J948 Other specified pleural conditions: Secondary | ICD-10-CM | POA: Diagnosis not present

## 2016-07-12 DIAGNOSIS — C7800 Secondary malignant neoplasm of unspecified lung: Secondary | ICD-10-CM | POA: Diagnosis not present

## 2016-07-12 DIAGNOSIS — I10 Essential (primary) hypertension: Secondary | ICD-10-CM | POA: Diagnosis present

## 2016-07-12 DIAGNOSIS — J939 Pneumothorax, unspecified: Secondary | ICD-10-CM | POA: Diagnosis not present

## 2016-07-12 DIAGNOSIS — J918 Pleural effusion in other conditions classified elsewhere: Secondary | ICD-10-CM | POA: Diagnosis not present

## 2016-07-12 DIAGNOSIS — J811 Chronic pulmonary edema: Secondary | ICD-10-CM | POA: Diagnosis not present

## 2016-07-12 DIAGNOSIS — R1312 Dysphagia, oropharyngeal phase: Secondary | ICD-10-CM | POA: Diagnosis not present

## 2016-07-12 DIAGNOSIS — R633 Feeding difficulties: Secondary | ICD-10-CM | POA: Diagnosis present

## 2016-07-12 DIAGNOSIS — R1314 Dysphagia, pharyngoesophageal phase: Secondary | ICD-10-CM | POA: Diagnosis not present

## 2016-07-12 DIAGNOSIS — E875 Hyperkalemia: Secondary | ICD-10-CM | POA: Diagnosis not present

## 2016-07-12 DIAGNOSIS — N1330 Unspecified hydronephrosis: Secondary | ICD-10-CM | POA: Diagnosis not present

## 2016-07-12 DIAGNOSIS — Z4682 Encounter for fitting and adjustment of non-vascular catheter: Secondary | ICD-10-CM | POA: Diagnosis not present

## 2016-07-12 DIAGNOSIS — R188 Other ascites: Secondary | ICD-10-CM | POA: Diagnosis present

## 2016-07-12 DIAGNOSIS — C801 Malignant (primary) neoplasm, unspecified: Secondary | ICD-10-CM | POA: Diagnosis not present

## 2016-07-12 DIAGNOSIS — K30 Functional dyspepsia: Secondary | ICD-10-CM | POA: Diagnosis present

## 2016-07-12 DIAGNOSIS — M7989 Other specified soft tissue disorders: Secondary | ICD-10-CM | POA: Diagnosis not present

## 2016-07-12 DIAGNOSIS — Z9981 Dependence on supplemental oxygen: Secondary | ICD-10-CM | POA: Diagnosis not present

## 2016-07-12 DIAGNOSIS — R630 Anorexia: Secondary | ICD-10-CM | POA: Diagnosis not present

## 2016-07-12 DIAGNOSIS — Z8546 Personal history of malignant neoplasm of prostate: Secondary | ICD-10-CM | POA: Diagnosis not present

## 2016-07-12 DIAGNOSIS — R5381 Other malaise: Secondary | ICD-10-CM | POA: Diagnosis not present

## 2016-07-12 DIAGNOSIS — Z9221 Personal history of antineoplastic chemotherapy: Secondary | ICD-10-CM | POA: Diagnosis not present

## 2016-07-12 DIAGNOSIS — E861 Hypovolemia: Secondary | ICD-10-CM | POA: Diagnosis not present

## 2016-07-12 DIAGNOSIS — J910 Malignant pleural effusion: Secondary | ICD-10-CM | POA: Diagnosis not present

## 2016-07-12 DIAGNOSIS — K5669 Other partial intestinal obstruction: Secondary | ICD-10-CM | POA: Diagnosis not present

## 2016-07-12 DIAGNOSIS — Z923 Personal history of irradiation: Secondary | ICD-10-CM | POA: Diagnosis not present

## 2016-07-12 DIAGNOSIS — J9601 Acute respiratory failure with hypoxia: Secondary | ICD-10-CM | POA: Diagnosis present

## 2016-07-12 DIAGNOSIS — J90 Pleural effusion, not elsewhere classified: Secondary | ICD-10-CM | POA: Diagnosis not present

## 2016-07-12 DIAGNOSIS — Z515 Encounter for palliative care: Secondary | ICD-10-CM | POA: Diagnosis not present

## 2016-07-12 DIAGNOSIS — C155 Malignant neoplasm of lower third of esophagus: Secondary | ICD-10-CM | POA: Diagnosis not present

## 2016-07-12 DIAGNOSIS — E7800 Pure hypercholesterolemia, unspecified: Secondary | ICD-10-CM | POA: Diagnosis present

## 2016-07-12 DIAGNOSIS — Z7401 Bed confinement status: Secondary | ICD-10-CM | POA: Diagnosis not present

## 2016-07-12 DIAGNOSIS — J189 Pneumonia, unspecified organism: Secondary | ICD-10-CM | POA: Diagnosis not present

## 2016-07-12 DIAGNOSIS — R918 Other nonspecific abnormal finding of lung field: Secondary | ICD-10-CM | POA: Diagnosis not present

## 2016-07-12 DIAGNOSIS — N131 Hydronephrosis with ureteral stricture, not elsewhere classified: Secondary | ICD-10-CM | POA: Diagnosis not present

## 2016-07-12 DIAGNOSIS — R1310 Dysphagia, unspecified: Secondary | ICD-10-CM | POA: Diagnosis not present

## 2016-07-12 DIAGNOSIS — M8580 Other specified disorders of bone density and structure, unspecified site: Secondary | ICD-10-CM | POA: Diagnosis present

## 2016-07-12 DIAGNOSIS — J869 Pyothorax without fistula: Secondary | ICD-10-CM | POA: Diagnosis present

## 2016-07-12 DIAGNOSIS — N179 Acute kidney failure, unspecified: Secondary | ICD-10-CM | POA: Diagnosis not present

## 2016-07-12 DIAGNOSIS — K439 Ventral hernia without obstruction or gangrene: Secondary | ICD-10-CM | POA: Diagnosis not present

## 2016-07-12 DIAGNOSIS — K599 Functional intestinal disorder, unspecified: Secondary | ICD-10-CM | POA: Diagnosis not present

## 2016-07-12 DIAGNOSIS — Z8501 Personal history of malignant neoplasm of esophagus: Secondary | ICD-10-CM | POA: Diagnosis not present

## 2016-07-12 DIAGNOSIS — J9811 Atelectasis: Secondary | ICD-10-CM | POA: Diagnosis not present

## 2016-07-12 DIAGNOSIS — R05 Cough: Secondary | ICD-10-CM | POA: Diagnosis not present

## 2016-07-12 DIAGNOSIS — C786 Secondary malignant neoplasm of retroperitoneum and peritoneum: Secondary | ICD-10-CM | POA: Diagnosis not present

## 2016-07-12 LAB — RHEUMATOID FACTORS, FLUID: Rheumatoid Arthritis, Qn/Fluid: NEGATIVE

## 2016-07-13 DIAGNOSIS — J189 Pneumonia, unspecified organism: Secondary | ICD-10-CM | POA: Diagnosis not present

## 2016-07-13 DIAGNOSIS — J90 Pleural effusion, not elsewhere classified: Secondary | ICD-10-CM | POA: Diagnosis not present

## 2016-07-13 DIAGNOSIS — C782 Secondary malignant neoplasm of pleura: Secondary | ICD-10-CM | POA: Diagnosis not present

## 2016-07-13 DIAGNOSIS — J910 Malignant pleural effusion: Secondary | ICD-10-CM | POA: Diagnosis not present

## 2016-07-13 LAB — PH, BODY FLUID: pH, Body Fluid: 7.7

## 2016-07-14 DIAGNOSIS — C782 Secondary malignant neoplasm of pleura: Secondary | ICD-10-CM | POA: Diagnosis not present

## 2016-07-15 LAB — BODY FLUID CULTURE: Culture: NO GROWTH

## 2016-07-16 LAB — ADENOSIDE DEAMINASE, PLEURAL FL: ADENOSIDE DEAMINASE, PLEURAL FL: <1.6 U/L (ref 0.0–9.4)

## 2016-07-16 LAB — CHOLESTEROL, BODY FLUID: CHOL FL: 83 mg/dL

## 2016-07-25 ENCOUNTER — Ambulatory Visit: Payer: Medicare Other | Admitting: Psychology

## 2016-08-26 DEATH — deceased

## 2018-04-29 IMAGING — RF DG ESOPHAGUS
8 series · 14 of 15 positions shown · non-contrast
Comparison: No recent prior.

CLINICAL DATA: Dysphagia.

EXAM:
ESOPHOGRAM/BARIUM SWALLOW
TECHNIQUE: Combined double contrast and single contrast examination performed
using effervescent crystals, thick barium liquid, and thin barium
liquid.
FLUOROSCOPY TIME:  Radiation Exposure Index (as provided by the
fluoroscopic device): 26 mGy.

[Series 1: fluoro_barium 2fps_bw · 0.17mm/px · 4 of 7 frames shown (1 of 8)]
[frame 2/7]
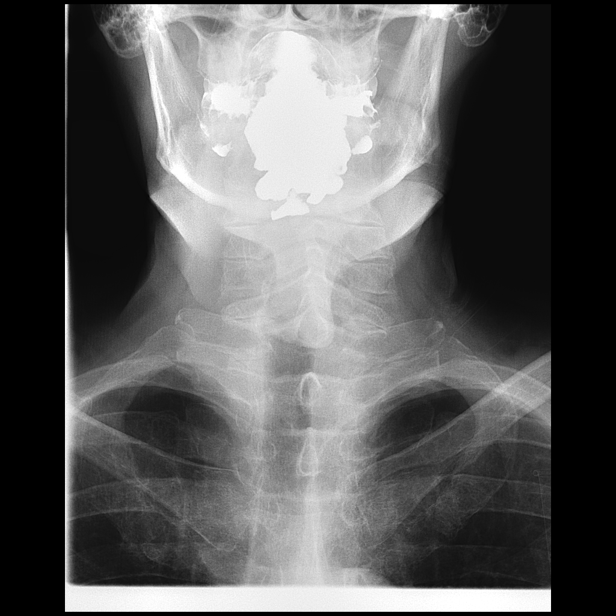
[frame 3/7]
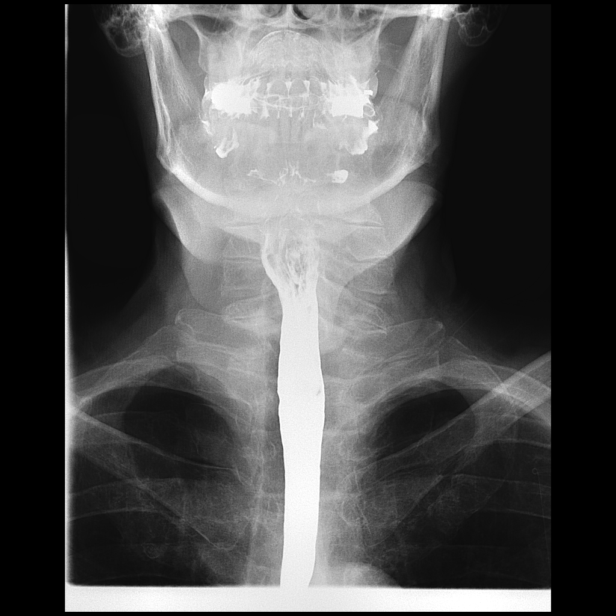
[frame 4/7]
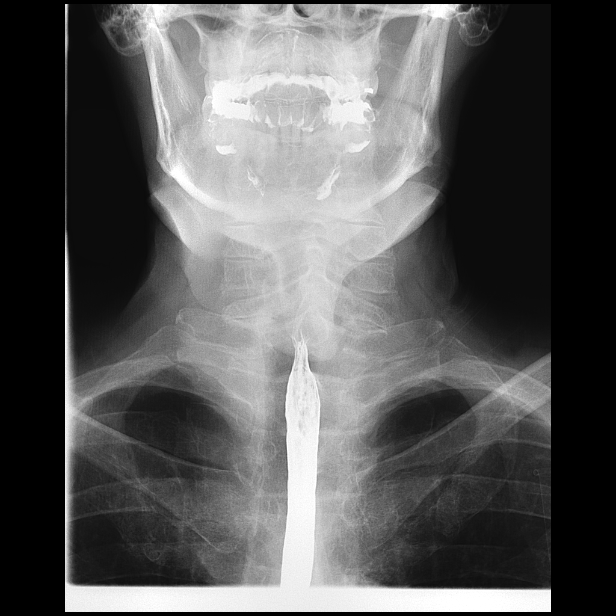
[frame 6/7]
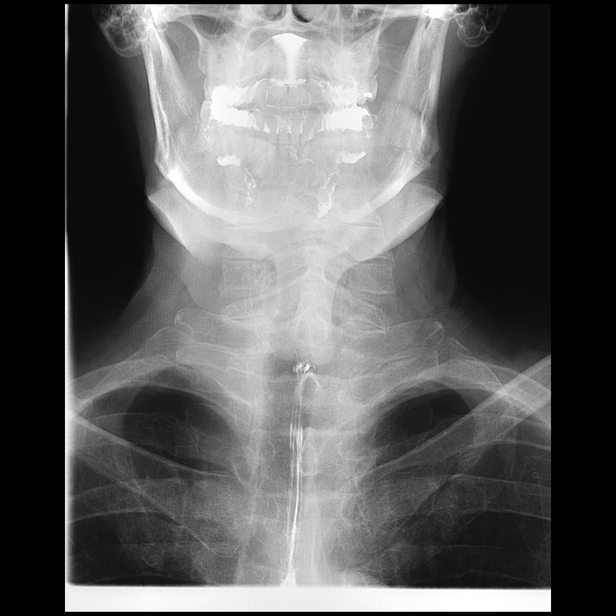

[Series 2: fluoro_barium 2fps_bw · 0.18mm/px · 3 of 5 frames shown (2 of 8)]
[frame 1/5]
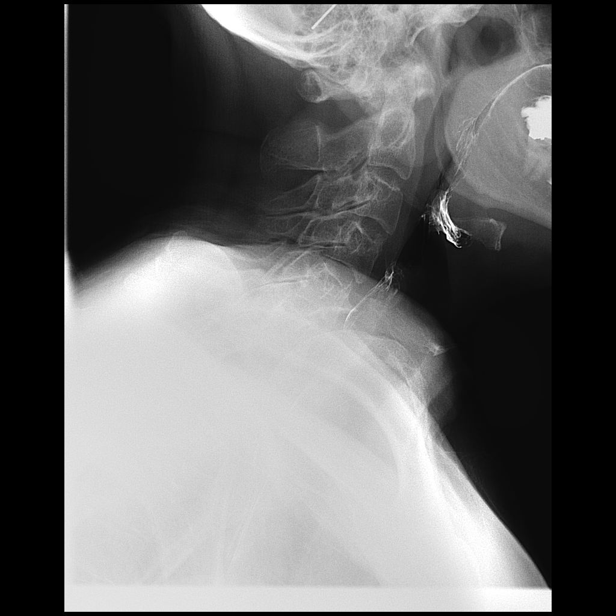
[frame 3/5]
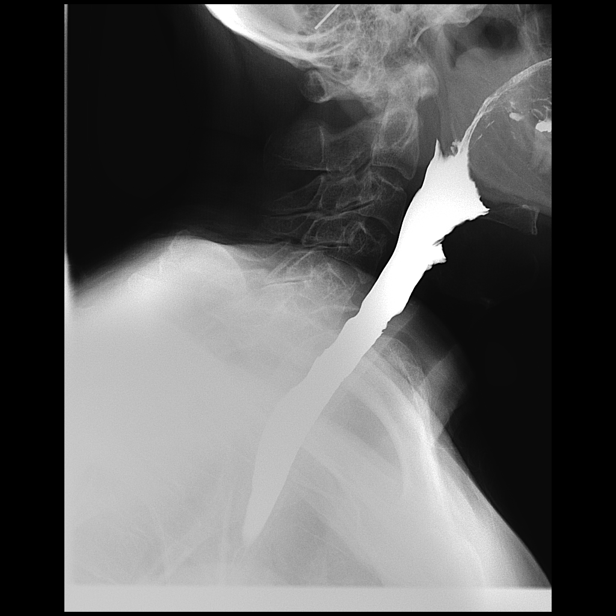
[frame 4/5]
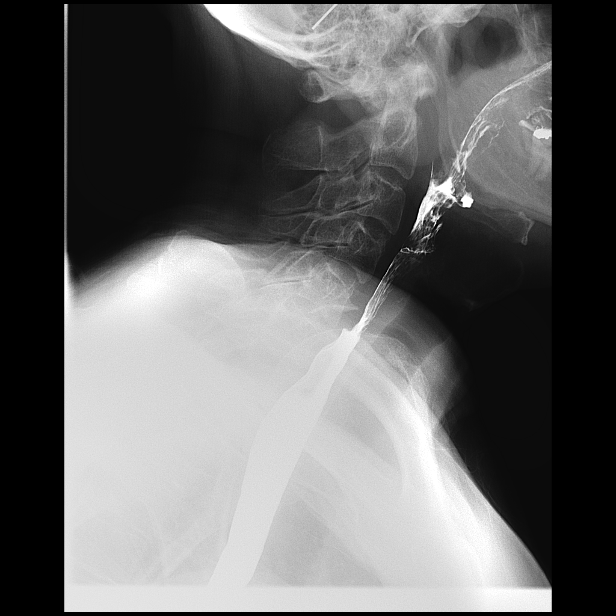

[Series 3: fluoro_barium 2fps_bw · 0.18mm/px · 1 of 1 slices shown (3 of 8)]
[im 1/1]
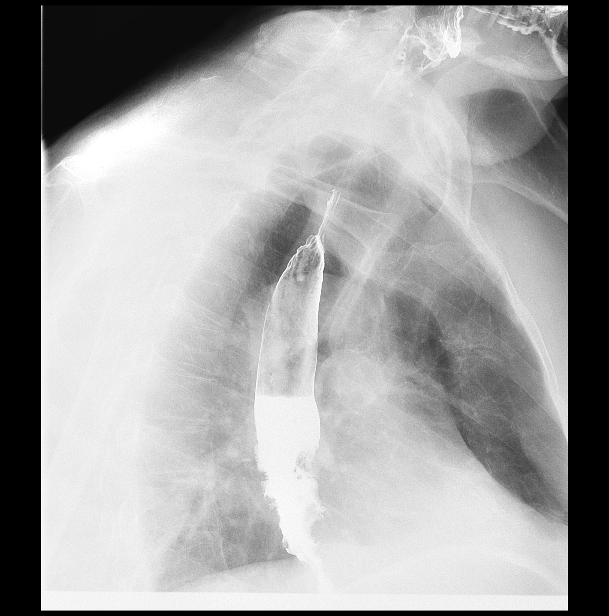

[Series 4: fluoro_barium 2fps_bw · 0.17mm/px · 2 of 2 frames shown (4 of 8)]
[frame 1/2]
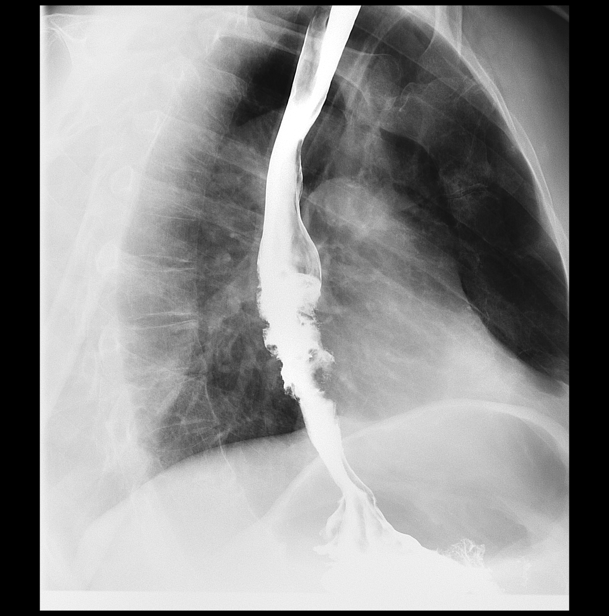
[frame 2/2]
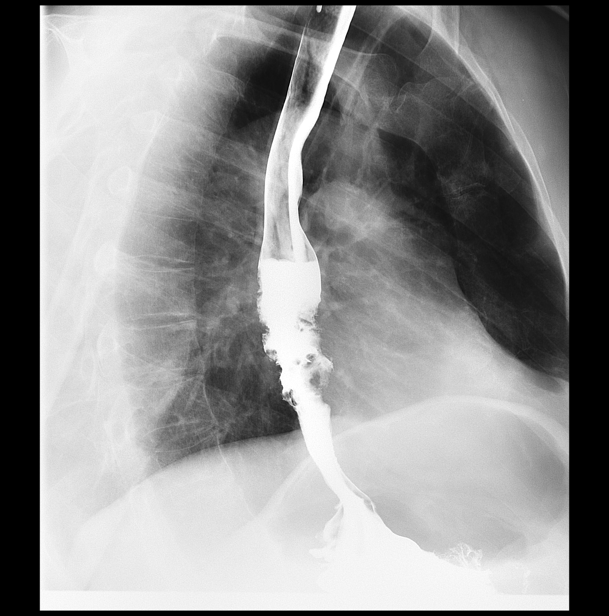

[Series 5: fluoro_barium 2fps_bw · 0.17mm/px · 1 of 1 slices shown (5 of 8)]
[im 1/1]
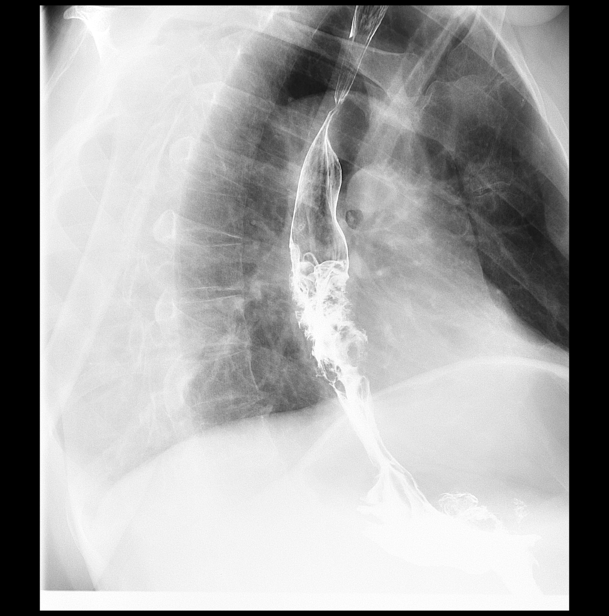

[Series 6: fluoro_barium 2fps_bw · 0.18mm/px · 1 of 1 slices shown (6 of 8)]
[im 1/1]
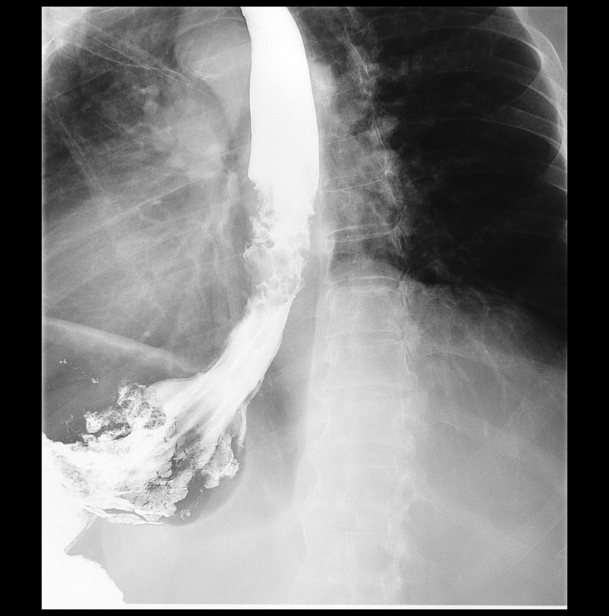

[Series 7: fluoro_barium 2fps_bw · 0.18mm/px · 1 of 1 slices shown (7 of 8)]
[im 1/1]
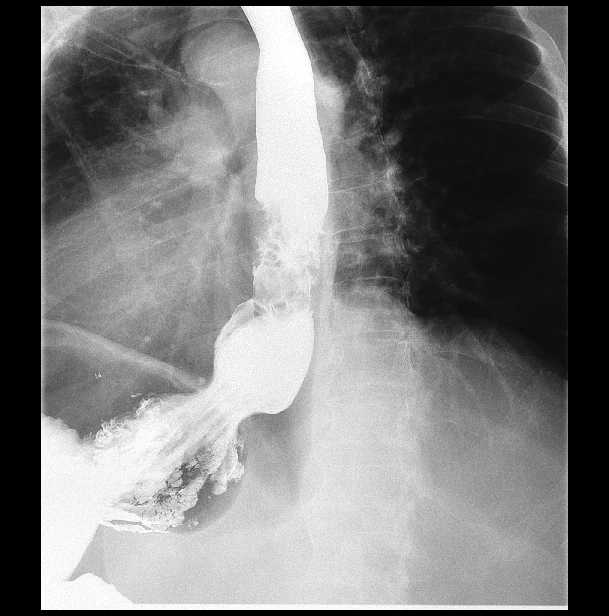

[Series 8: fluoro_barium 2fps_bw · 0.18mm/px · 1 of 1 slices shown (8 of 8)]
[im 1/1]
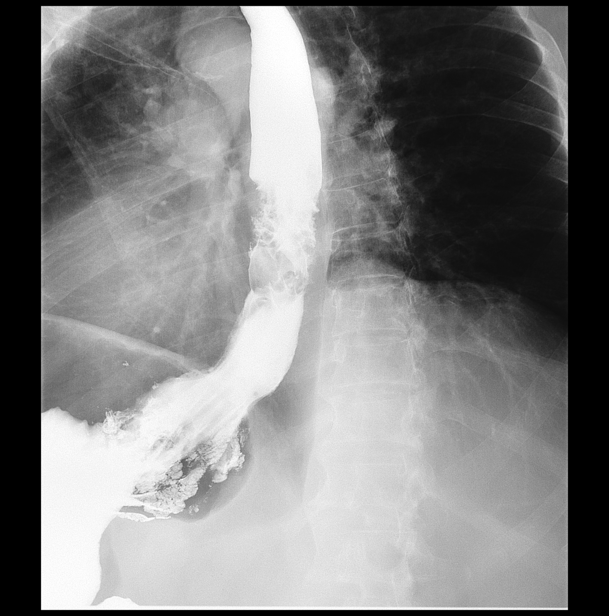

[14 of 15 positions shown; findings below may reference images not displayed]

FINDINGS: What appears to represent a large irregular mass lesion extending
over approximately 5.7 cm versus retained debris noted in the distal
esophagus. Esophageal malignancy could present this fashion.
Endoscopic evaluation is suggested. Cervical esophagus is
unremarkable. Barium tablet was not given due to the esophageal
process. Small hiatal hernia. No reflux noted.
IMPRESSION: Large irregular mass lesion extending over approximately 5 cm
segment versus retained debris distal esophagus.

Esophageal malignancy cannot be excluded.

Endoscopic evaluation is suggested.

These results will be called to the ordering clinician or
representative by the Radiologist Assistant, and communication
documented in the PACS or zVision Dashboard.
